# Patient Record
Sex: Female | Born: 1940 | Race: White | Hispanic: No | Smoking: Never smoker
Health system: Southern US, Community
[De-identification: ages and names within clinical notes are randomized; demographics above are authoritative.]

## PROBLEM LIST (undated history)

## (undated) DIAGNOSIS — I10 Essential (primary) hypertension: Secondary | ICD-10-CM

## (undated) DIAGNOSIS — K219 Gastro-esophageal reflux disease without esophagitis: Secondary | ICD-10-CM

## (undated) DIAGNOSIS — E7800 Pure hypercholesterolemia, unspecified: Secondary | ICD-10-CM

## (undated) DIAGNOSIS — F419 Anxiety disorder, unspecified: Secondary | ICD-10-CM

## (undated) DIAGNOSIS — E119 Type 2 diabetes mellitus without complications: Secondary | ICD-10-CM

## (undated) HISTORY — PX: COLON SURGERY: SHX602

## (undated) HISTORY — PX: ABDOMINAL HYSTERECTOMY: SHX81

## (undated) HISTORY — PX: BREAST EXCISIONAL BIOPSY: SUR124

---

## 2004-08-06 ENCOUNTER — Ambulatory Visit: Payer: Self-pay | Admitting: Occupational Therapy

## 2006-01-08 ENCOUNTER — Ambulatory Visit: Payer: Self-pay | Admitting: Otolaryngology

## 2006-01-17 IMAGING — MG UNKNOWN MG STUDY
1 series · 4 of 4 positions shown · non-contrast
Comparison: none

REASON FOR EXAM: screening

[R CC · right · 4 of 4 slices shown]
[im 1/4]
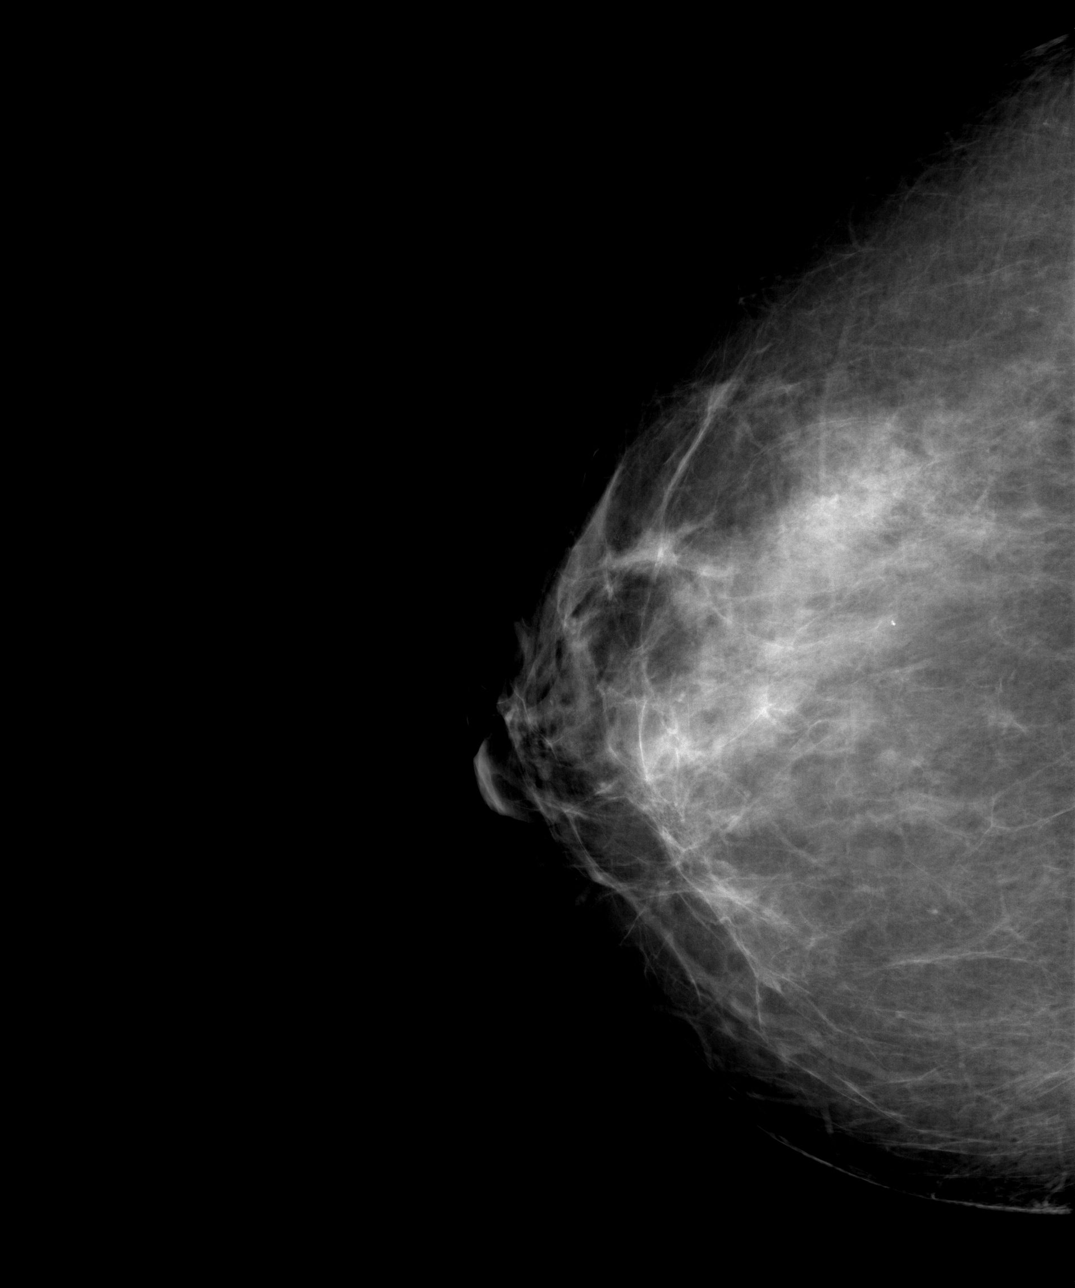
[im 2/4]
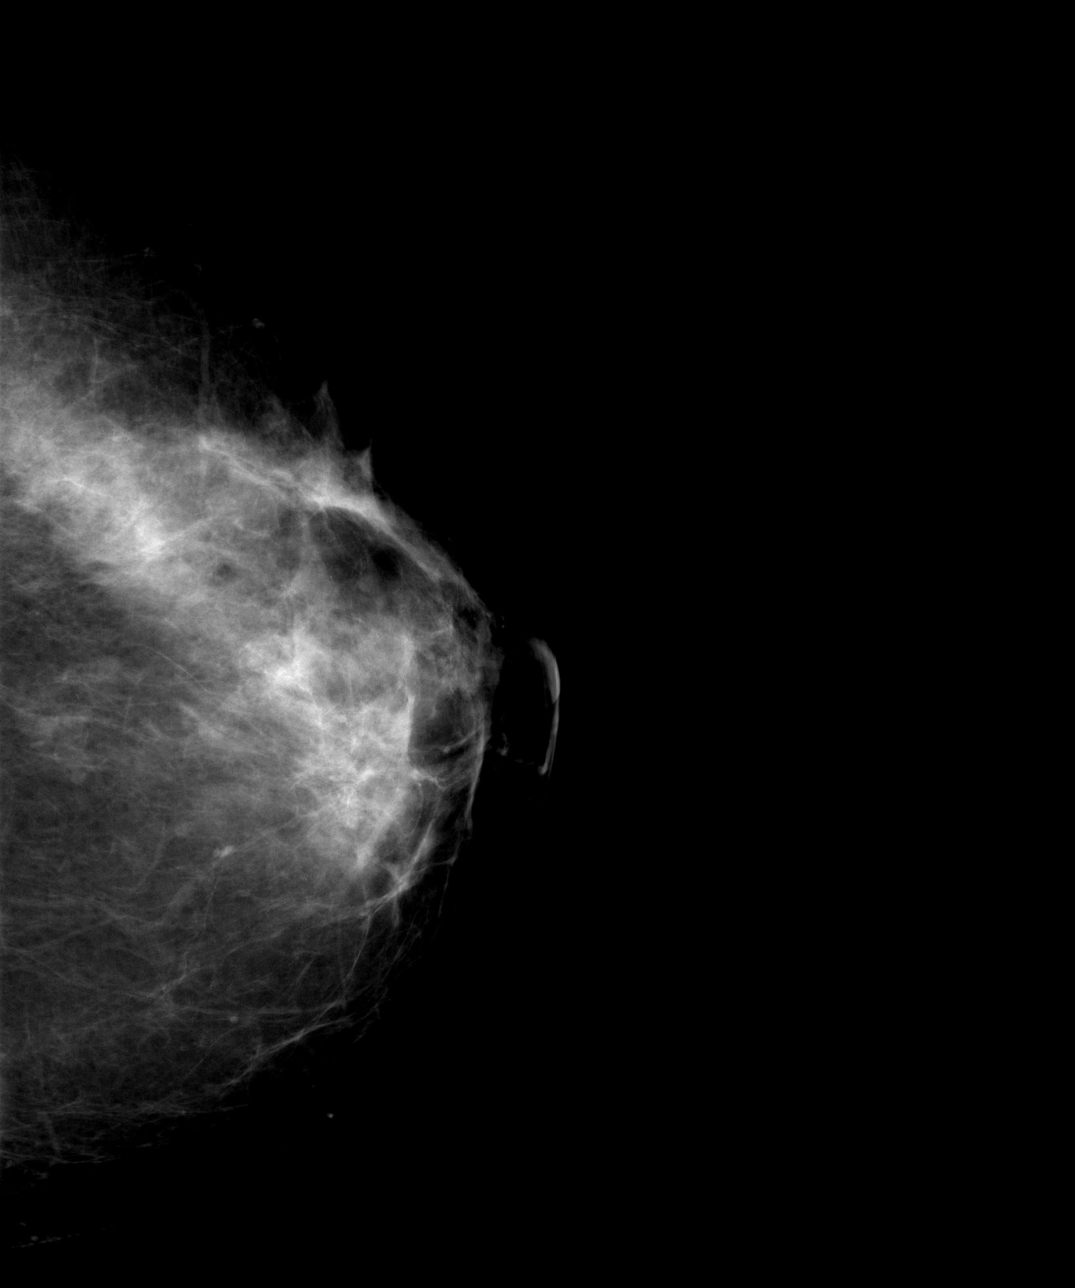
[im 3/4]
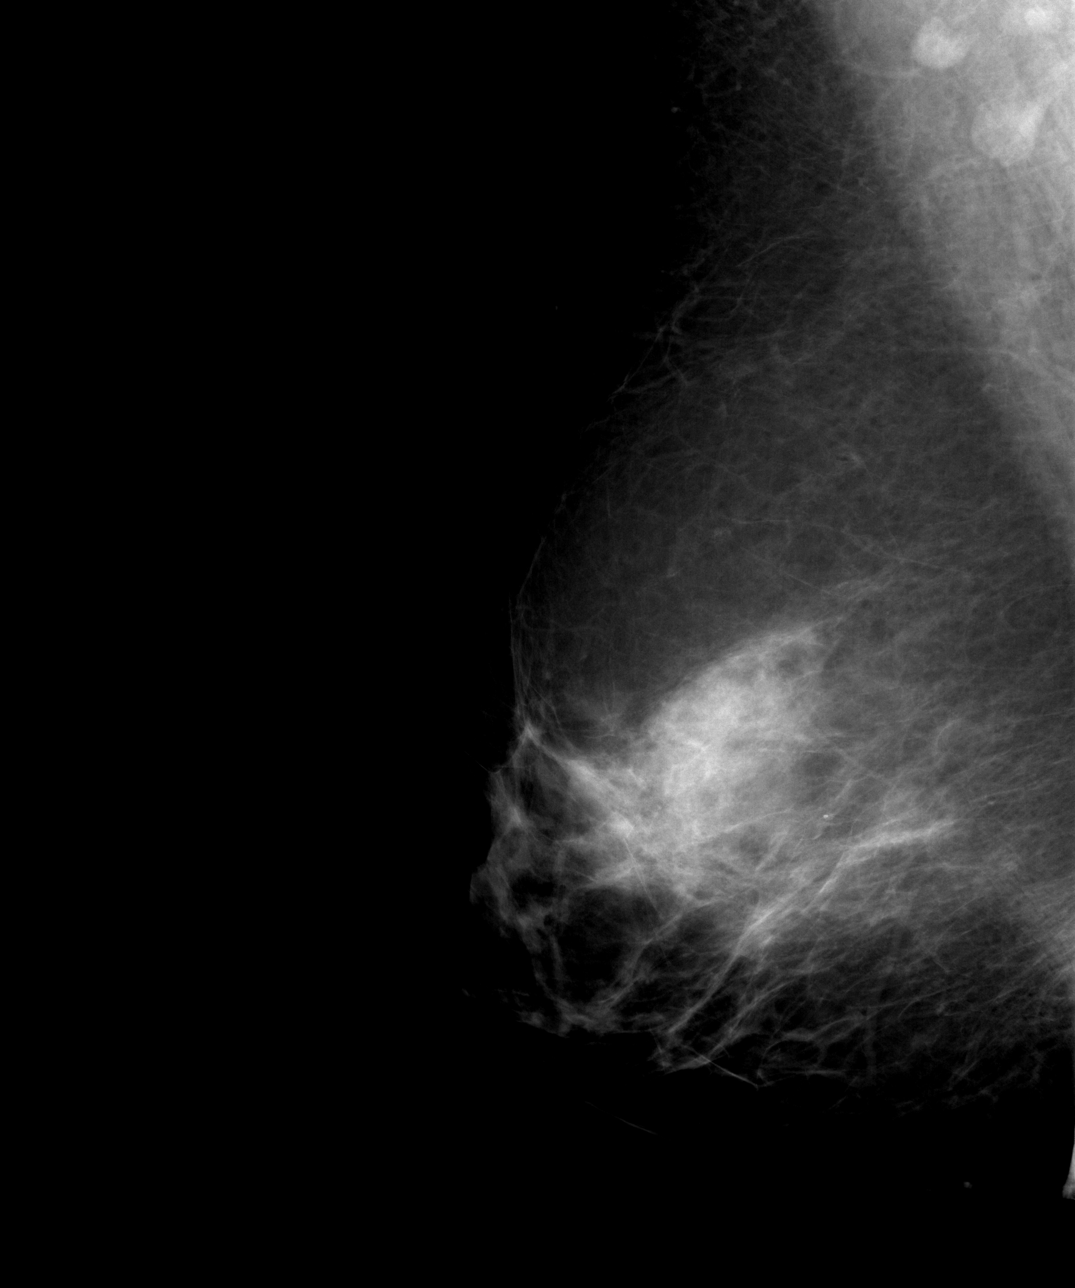
[im 4/4]
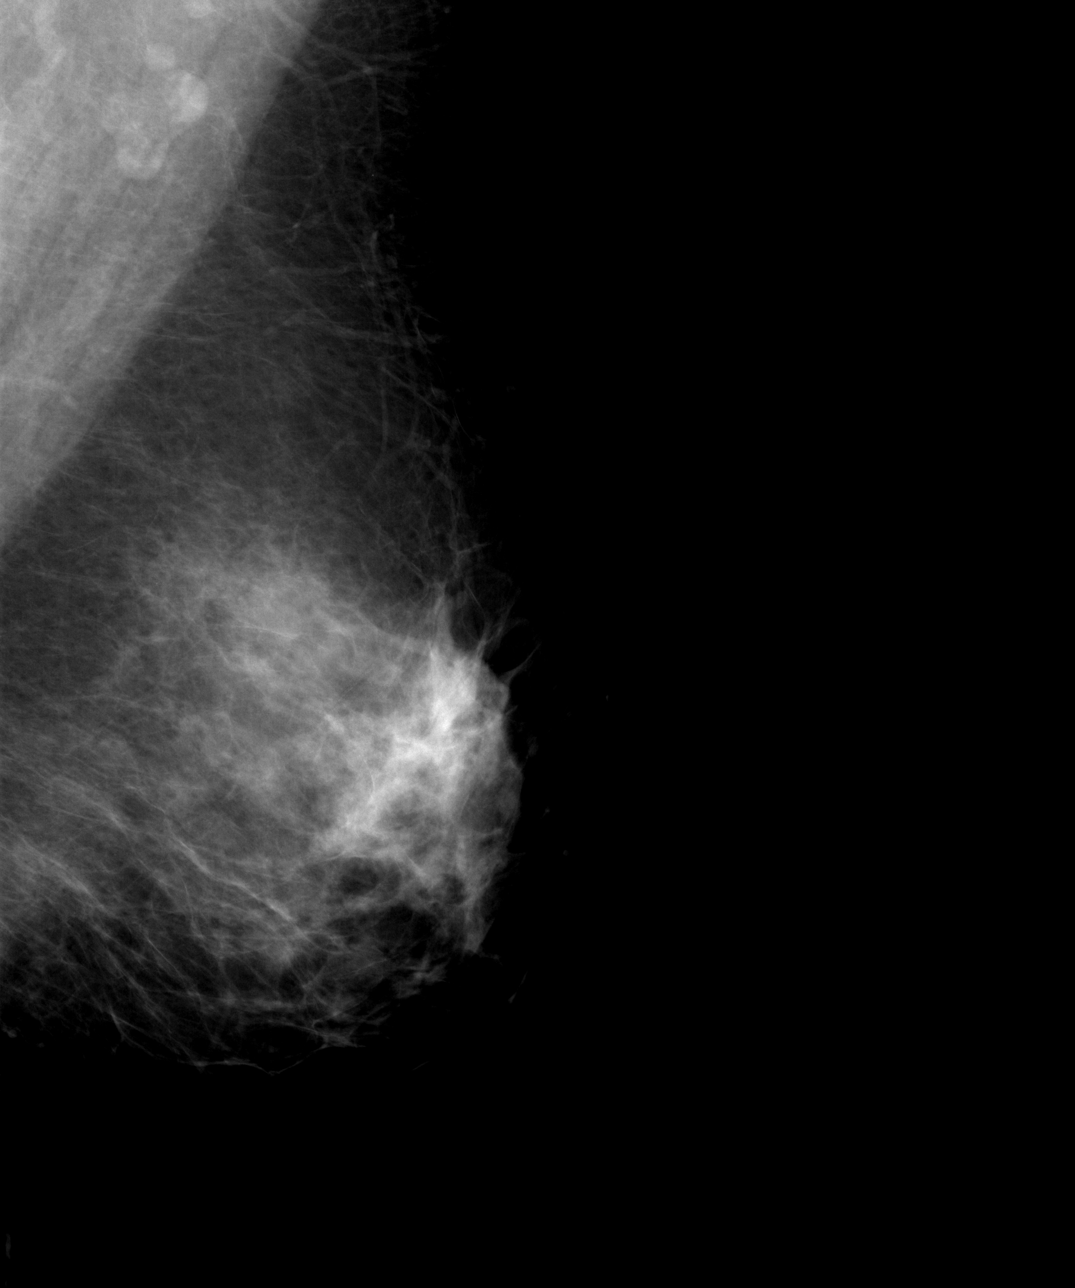

[4 of 4 positions shown; findings below may reference images not displayed]

Procedure: DIGITAL SCREENING MAMMOGRAM WITH CAD

 She is 62. She has no personal or family history of breast cancer. She does
not take hormones. She has no complaints today.

 Screening mammogram was performed and compared with the prior study from
02-02-01. The breasts are a mixture of dense fibroglandular tissue and fatty
replacement with the overall parenchymal pattern remaining stable. There is
no evidence of a dominant stellate mass, architectural distortion, or
microcalcification to suspect malignancy.
IMPRESSION: Stable mammogram. No change since prior studies. The patient should return
for a bilateral screening mammogram in one year.

 BI-RADS: Category 2-Benign Finding.

 A NEGATIVE MAMMOGRAM REPORT DOES NOT PRECLUDE BIOPSY OR OTHER EVALUATION OF
A CLINICALLY PALPABLE OR OTHERWISE SUSPICIOUS MASS OR LESION. BREAST CANCER
MAY NOT BE DETECTED BY MAMMOGRAPHY IN UP TO 10% OF CASES.

## 2006-02-24 ENCOUNTER — Ambulatory Visit: Payer: Self-pay | Admitting: Nurse Practitioner

## 2006-04-29 ENCOUNTER — Ambulatory Visit: Payer: Self-pay | Admitting: Nurse Practitioner

## 2007-10-28 ENCOUNTER — Ambulatory Visit: Payer: Self-pay | Admitting: Nurse Practitioner

## 2009-03-08 ENCOUNTER — Ambulatory Visit: Payer: Self-pay | Admitting: Nurse Practitioner

## 2009-04-18 ENCOUNTER — Emergency Department: Payer: Self-pay | Admitting: Emergency Medicine

## 2010-09-12 ENCOUNTER — Ambulatory Visit: Payer: Self-pay | Admitting: Nurse Practitioner

## 2013-07-28 ENCOUNTER — Ambulatory Visit: Payer: Self-pay | Admitting: Surgery

## 2013-07-29 ENCOUNTER — Ambulatory Visit: Payer: Self-pay | Admitting: Surgery

## 2013-07-29 DIAGNOSIS — I10 Essential (primary) hypertension: Secondary | ICD-10-CM

## 2013-07-29 LAB — CBC WITH DIFFERENTIAL/PLATELET
Basophil #: 0 10*3/uL (ref 0.0–0.1)
Basophil %: 0.4 %
Eosinophil #: 0.1 10*3/uL (ref 0.0–0.7)
Eosinophil %: 1.7 %
HCT: 34.9 % — ABNORMAL LOW (ref 35.0–47.0)
HGB: 12.2 g/dL (ref 12.0–16.0)
Lymphocyte #: 1.6 10*3/uL (ref 1.0–3.6)
Lymphocyte %: 25.8 %
MCH: 31.4 pg (ref 26.0–34.0)
MCHC: 34.9 g/dL (ref 32.0–36.0)
MCV: 90 fL (ref 80–100)
Monocyte #: 0.8 x10 3/mm (ref 0.2–0.9)
Monocyte %: 12.3 %
Neutrophil #: 3.7 10*3/uL (ref 1.4–6.5)
Neutrophil %: 59.8 %
PLATELETS: 237 10*3/uL (ref 150–440)
RBC: 3.88 10*6/uL (ref 3.80–5.20)
RDW: 13.5 % (ref 11.5–14.5)
WBC: 6.2 10*3/uL (ref 3.6–11.0)

## 2013-08-06 ENCOUNTER — Inpatient Hospital Stay: Payer: Self-pay | Admitting: Surgery

## 2013-08-07 LAB — CBC WITH DIFFERENTIAL/PLATELET
BASOS PCT: 0.3 %
Basophil #: 0 10*3/uL (ref 0.0–0.1)
EOS ABS: 0 10*3/uL (ref 0.0–0.7)
Eosinophil %: 0.1 %
HCT: 32.4 % — ABNORMAL LOW (ref 35.0–47.0)
HGB: 11.2 g/dL — AB (ref 12.0–16.0)
LYMPHS PCT: 13.5 %
Lymphocyte #: 1.4 10*3/uL (ref 1.0–3.6)
MCH: 31.2 pg (ref 26.0–34.0)
MCHC: 34.5 g/dL (ref 32.0–36.0)
MCV: 91 fL (ref 80–100)
Monocyte #: 0.9 x10 3/mm (ref 0.2–0.9)
Monocyte %: 8.2 %
NEUTROS ABS: 8.1 10*3/uL — AB (ref 1.4–6.5)
NEUTROS PCT: 77.9 %
Platelet: 179 10*3/uL (ref 150–440)
RBC: 3.58 10*6/uL — AB (ref 3.80–5.20)
RDW: 13.2 % (ref 11.5–14.5)
WBC: 10.4 10*3/uL (ref 3.6–11.0)

## 2013-08-07 LAB — BASIC METABOLIC PANEL
ANION GAP: 6 — AB (ref 7–16)
BUN: 10 mg/dL (ref 7–18)
CO2: 27 mmol/L (ref 21–32)
Calcium, Total: 8.2 mg/dL — ABNORMAL LOW (ref 8.5–10.1)
Chloride: 97 mmol/L — ABNORMAL LOW (ref 98–107)
Creatinine: 0.87 mg/dL (ref 0.60–1.30)
Glucose: 167 mg/dL — ABNORMAL HIGH (ref 65–99)
OSMOLALITY: 264 (ref 275–301)
Potassium: 2.9 mmol/L — ABNORMAL LOW (ref 3.5–5.1)
SODIUM: 130 mmol/L — AB (ref 136–145)

## 2013-08-08 LAB — BASIC METABOLIC PANEL
Anion Gap: 5 — ABNORMAL LOW (ref 7–16)
BUN: 7 mg/dL (ref 7–18)
CO2: 28 mmol/L (ref 21–32)
CREATININE: 0.66 mg/dL (ref 0.60–1.30)
Calcium, Total: 8.7 mg/dL (ref 8.5–10.1)
Chloride: 107 mmol/L (ref 98–107)
EGFR (African American): >60
Glucose: 126 mg/dL — ABNORMAL HIGH (ref 65–99)
Osmolality: 279 (ref 275–301)
POTASSIUM: 3.3 mmol/L — AB (ref 3.5–5.1)
SODIUM: 140 mmol/L (ref 136–145)

## 2013-08-08 LAB — CBC WITH DIFFERENTIAL/PLATELET
Basophil #: 0 10*3/uL (ref 0.0–0.1)
Basophil %: 0.5 %
EOS ABS: 0 10*3/uL (ref 0.0–0.7)
Eosinophil %: 0.6 %
HCT: 32.2 % — ABNORMAL LOW (ref 35.0–47.0)
HGB: 11.1 g/dL — ABNORMAL LOW (ref 12.0–16.0)
Lymphocyte #: 1.2 10*3/uL (ref 1.0–3.6)
Lymphocyte %: 15.2 %
MCH: 31 pg (ref 26.0–34.0)
MCHC: 34.4 g/dL (ref 32.0–36.0)
MCV: 90 fL (ref 80–100)
Monocyte #: 0.7 x10 3/mm (ref 0.2–0.9)
Monocyte %: 8.8 %
Neutrophil #: 5.7 10*3/uL (ref 1.4–6.5)
Neutrophil %: 74.9 %
PLATELETS: 160 10*3/uL (ref 150–440)
RBC: 3.58 10*6/uL — AB (ref 3.80–5.20)
RDW: 13.4 % (ref 11.5–14.5)
WBC: 7.7 10*3/uL (ref 3.6–11.0)

## 2013-08-09 LAB — BASIC METABOLIC PANEL
Anion Gap: 2 — ABNORMAL LOW (ref 7–16)
BUN: 7 mg/dL (ref 7–18)
CHLORIDE: 105 mmol/L (ref 98–107)
CO2: 32 mmol/L (ref 21–32)
Calcium, Total: 8.9 mg/dL (ref 8.5–10.1)
Creatinine: 0.73 mg/dL (ref 0.60–1.30)
EGFR (African American): >60
EGFR (Non-African Amer.): >60
Glucose: 113 mg/dL — ABNORMAL HIGH (ref 65–99)
Osmolality: 276 (ref 275–301)
POTASSIUM: 3.9 mmol/L (ref 3.5–5.1)
Sodium: 139 mmol/L (ref 136–145)

## 2013-08-10 LAB — PATHOLOGY REPORT

## 2013-08-28 ENCOUNTER — Emergency Department: Payer: Self-pay | Admitting: Emergency Medicine

## 2013-08-28 LAB — CBC WITH DIFFERENTIAL/PLATELET
BASOS ABS: 0.1 10*3/uL (ref 0.0–0.1)
Basophil %: 0.6 %
Eosinophil #: 0.1 10*3/uL (ref 0.0–0.7)
Eosinophil %: 0.4 %
HCT: 40 % (ref 35.0–47.0)
HGB: 13.6 g/dL (ref 12.0–16.0)
LYMPHS ABS: 1.2 10*3/uL (ref 1.0–3.6)
LYMPHS PCT: 7.4 %
MCH: 31.5 pg (ref 26.0–34.0)
MCHC: 34.1 g/dL (ref 32.0–36.0)
MCV: 92 fL (ref 80–100)
Monocyte #: 0.8 x10 3/mm (ref 0.2–0.9)
Monocyte %: 4.8 %
Neutrophil #: 14.2 10*3/uL — ABNORMAL HIGH (ref 1.4–6.5)
Neutrophil %: 86.8 %
Platelet: 345 10*3/uL (ref 150–440)
RBC: 4.34 10*6/uL (ref 3.80–5.20)
RDW: 13.5 % (ref 11.5–14.5)
WBC: 16.4 10*3/uL — ABNORMAL HIGH (ref 3.6–11.0)

## 2013-08-28 LAB — COMPREHENSIVE METABOLIC PANEL
ALBUMIN: 4.2 g/dL (ref 3.4–5.0)
ALK PHOS: 98 U/L
ANION GAP: 7 (ref 7–16)
BUN: 20 mg/dL — ABNORMAL HIGH (ref 7–18)
Bilirubin,Total: 0.4 mg/dL (ref 0.2–1.0)
CALCIUM: 9.7 mg/dL (ref 8.5–10.1)
Chloride: 100 mmol/L (ref 98–107)
Co2: 30 mmol/L (ref 21–32)
Creatinine: 0.88 mg/dL (ref 0.60–1.30)
EGFR (Non-African Amer.): >60
Glucose: 157 mg/dL — ABNORMAL HIGH (ref 65–99)
Osmolality: 280 (ref 275–301)
POTASSIUM: 3.4 mmol/L — AB (ref 3.5–5.1)
SGOT(AST): 10 U/L — ABNORMAL LOW (ref 15–37)
SGPT (ALT): 18 U/L (ref 12–78)
Sodium: 137 mmol/L (ref 136–145)
TOTAL PROTEIN: 7.5 g/dL (ref 6.4–8.2)

## 2014-02-11 ENCOUNTER — Observation Stay: Payer: Self-pay | Admitting: Specialist

## 2014-02-11 LAB — URINALYSIS, COMPLETE
BACTERIA: NONE SEEN
BLOOD: NEGATIVE
Bilirubin,UR: NEGATIVE
Glucose,UR: NEGATIVE mg/dL (ref 0–75)
Ketone: NEGATIVE
Nitrite: NEGATIVE
PH: 5 (ref 4.5–8.0)
Protein: NEGATIVE
RBC,UR: NONE SEEN /HPF (ref 0–5)
SPECIFIC GRAVITY: 1.011 (ref 1.003–1.030)
Squamous Epithelial: <1
WBC UR: 4 /HPF (ref 0–5)

## 2014-02-11 LAB — COMPREHENSIVE METABOLIC PANEL
ALK PHOS: 93 U/L
ALT: 22 U/L
AST: 24 U/L (ref 15–37)
Albumin: 3.8 g/dL (ref 3.4–5.0)
Anion Gap: 8 (ref 7–16)
BUN: 18 mg/dL (ref 7–18)
Bilirubin,Total: 0.3 mg/dL (ref 0.2–1.0)
Calcium, Total: 9 mg/dL (ref 8.5–10.1)
Chloride: 103 mmol/L (ref 98–107)
Co2: 28 mmol/L (ref 21–32)
Creatinine: 0.87 mg/dL (ref 0.60–1.30)
Glucose: 110 mg/dL — ABNORMAL HIGH (ref 65–99)
Osmolality: 280 (ref 275–301)
Potassium: 3.8 mmol/L (ref 3.5–5.1)
Sodium: 139 mmol/L (ref 136–145)
Total Protein: 7.1 g/dL (ref 6.4–8.2)

## 2014-02-11 LAB — TROPONIN I

## 2014-02-11 LAB — CBC WITH DIFFERENTIAL/PLATELET
Basophil #: 0 10*3/uL (ref 0.0–0.1)
Basophil %: 0.8 %
Eosinophil #: 0.1 10*3/uL (ref 0.0–0.7)
Eosinophil %: 3.1 %
HCT: 37.7 % (ref 35.0–47.0)
HGB: 12.3 g/dL (ref 12.0–16.0)
LYMPHS ABS: 1.5 10*3/uL (ref 1.0–3.6)
Lymphocyte %: 31.7 %
MCH: 30.7 pg (ref 26.0–34.0)
MCHC: 32.5 g/dL (ref 32.0–36.0)
MCV: 95 fL (ref 80–100)
Monocyte #: 0.5 x10 3/mm (ref 0.2–0.9)
Monocyte %: 10.8 %
Neutrophil #: 2.5 10*3/uL (ref 1.4–6.5)
Neutrophil %: 53.6 %
Platelet: 191 10*3/uL (ref 150–440)
RBC: 3.99 10*6/uL (ref 3.80–5.20)
RDW: 12.9 % (ref 11.5–14.5)
WBC: 4.7 10*3/uL (ref 3.6–11.0)

## 2014-02-12 LAB — LIPID PANEL
Cholesterol: 154 mg/dL (ref 0–200)
HDL: 29 mg/dL — AB (ref 40–60)
LDL CHOLESTEROL, CALC: 66 mg/dL (ref 0–100)
TRIGLYCERIDES: 296 mg/dL — AB (ref 0–200)
VLDL Cholesterol, Calc: 59 mg/dL — ABNORMAL HIGH (ref 5–40)

## 2014-02-12 LAB — BASIC METABOLIC PANEL
Anion Gap: 6 — ABNORMAL LOW (ref 7–16)
BUN: 21 mg/dL — ABNORMAL HIGH (ref 7–18)
CHLORIDE: 106 mmol/L (ref 98–107)
CO2: 30 mmol/L (ref 21–32)
CREATININE: 0.82 mg/dL (ref 0.60–1.30)
Calcium, Total: 8.7 mg/dL (ref 8.5–10.1)
EGFR (African American): >60
GLUCOSE: 127 mg/dL — AB (ref 65–99)
OSMOLALITY: 288 (ref 275–301)
Potassium: 3.8 mmol/L (ref 3.5–5.1)
SODIUM: 142 mmol/L (ref 136–145)

## 2014-02-12 LAB — TSH: Thyroid Stimulating Horm: 1.31 u[IU]/mL

## 2014-02-12 LAB — HEMOGLOBIN A1C: Hemoglobin A1C: 6.7 % — ABNORMAL HIGH (ref 4.2–6.3)

## 2014-11-12 NOTE — Op Note (Signed)
PATIENT NAME:  Elizabeth Mcmillan, Elizabeth Mcmillan MR#:  [id number] DATE OF BIRTH:  11/25/1940  DATE OF PROCEDURE:  08/06/2013  PREOPERATIVE DIAGNOSIS: Left ovarian cyst.  POSTOPERATIVE DIAGNOSIS: Left ovarian cyst.   PROCEDURE: Bilateral salpingo-oophorectomy.   ANESTHESIA: General endotracheal anesthesia,   SURGEON: Boykin Nearing, M.D.   CO-SURGEON: Rochel Brome, MD.   INDICATIONS: This is a 74 year old gravida 4, para 4 patient with a tubovillous adenoma. Dr. Rochel Brome is the primary surgeon and co-surgeon for part of the procedure. The patient is scheduled for anterior resection of the colon   PROCEDURE: After adequate general endotracheal anesthesia, Dr. Rochel Brome made a vertical incision entrance into peritoneal cavity, accomplished without difficulty. Attention was directed to the patient's left ovary, which there was a 4 x 3 serous simple-appearing cyst with some small bowel adhesions noted to the ovary. These were taken down sharply. The infundibulopelvic ligament was clamped including the left ovary, the ovarian cyst and the left fallopian tube. Sharp dissection was used to remove the cyst. There was no rupture of the cyst. Pedicle was ligated with 0 Vicryl suture. Two separate sutures applied. A small oozing area was controlled with a 2-0 Vicryl figure-of-eight suture.   Attention was directed to the patient's right fallopian tube and ovary, which showed a small atretic right ovary, a small para-ovarian cyst noted as well. The ureter was identified with normal peristaltic activity well away from the right ovary. The infundibulopelvic ligament with the ovary and fallopian tube were clamped. Sharp dissection was used to remove the right fallopian tube and ovary and small cyst. Pedicle was ligated with 0 Vicryl suture with 2 separate sutures used to control hemostasis. The rest of the procedure will be performed by Dr. Rochel Brome. Please reference his operative report and the rest of the  dictation.   ____________________________ Boykin Nearing, MD tjs:aw D: 08/06/2013 10:33:06 ET T: 08/06/2013 10:47:11 ET JOB#: [id number]  cc: Boykin Nearing, MD, <Dictator> Boykin Nearing MD ELECTRONICALLY SIGNED 08/07/2013 10:25

## 2014-11-12 NOTE — Op Note (Signed)
PATIENT NAME:  Elizabeth Mcmillan, Elizabeth Mcmillan MR#:  [id number] DATE OF BIRTH:  Oct 31, 1940  DATE OF PROCEDURE:  08/06/2013  This is a repeat dictation.   PREOPERATIVE DIAGNOSIS:  Tubulovillous adenoma of the sigmoid colon, left ovarian cyst.   POSTOPERATIVE DIAGNOSIS:  Tubulovillous adenoma of the sigmoid colon, left ovarian cyst.  PROCEDURE:  Low anterior resection of the sigmoid colon and bilateral salpingo-oophorectomy.   INDICATIONS:  This patient had had some recent left lower quadrant discomfort, had ultrasound findings of approximately 4 cm cyst of the left ovary.  She saw Dr. Ouida Sills in the office and was advised to have colonoscopy which did have colonoscopy done by Dr. Rayann Heman which did demonstrate tubulovillous adenoma of the sigmoid colon 15 cm from the anal os which had a wide base, several centimeters in dimension and could not be resected with the colonoscope and therefore recommend surgery as definitive treatment.   DESCRIPTION OF PROCEDURE:  The patient was placed on the operating table in the supine position under general endotracheal anesthesia.  The legs were elevated into the lithotomy position using bumblebee stirrups.  The anal area was prepared with Betadine solution, perineum was prepared as well.  Foley catheter was inserted by the circulating nurse with drainage of clear yellow urine.  Next, the abdomen was prepared with ChloraPrep and draped in a sterile manner.   A lower abdominal midline incision was made and carried down through subcutaneous tissues.  Several small bleeding points were cauterized.  The midline fascia was incised and the peritoneal cavity was opened.   Initial inspection was done.  There was no palpable mass within the liver.  There was a simple-appearing 4 cm cyst of the left ovary.  Right ovary appeared normal.    I began the colonic dissection.    Dr. Ouida Sills came and then he did the bilateral salpingo-oophorectomy as I assisted and he will dictate a  separate note describing his surgical procedure.   After Dr. Ouida Sills finished, then he left the Operating Room and I continued with the colon resection.    The sigmoid colon was mobilized with incision of the lateral peritoneal reflection and also mobilized the descending colon as well.  It is noted that bilateral ureters were identified.  I could see the ink mark in the distal sigmoid colon, although there was no hard palpable mass.  There was no palpable adenopathy.  A proximal margin of resection was selected in the mid aspect of the sigmoid colon and the window was created in the mesentery at this point and the GIA 55 stapler was placed across the bowel and activated placing two rows of staples on each side and dividing the bowel.  Next, Harmonic scalpel was used to begin the mesenteric dissection and dissecting down deeply within the mesentery.  The inferior epigastric artery and veins were suture ligated with 0 chromic and also divided with the Harmonic scalpel.  Further dissection was carried out dissecting the mesentery and subsequently created another window in the small bowel distal to the tattoo and subsequently completed the mesenteric dissection with the Harmonic scalpel.  Bleeding was minimal.  Next, after skeletonizing the bowel at the sigmoid rectal junction the bowel was divided by placing a TA-30 stapler across the bowel and activating it and then cut across the bowel with a scalpel removing the segment of sigmoid colon and momentarily removed the TA-30 stapler.    The segment of colon was carried over to the side table and examined.  Seeing the  polyp within the colon with approximately 2 cm normal-appearing colon distal to the polyp and this was submitted for routine pathology and with the requisition slip indicating that the staple line on the specimen was the proximal margin and this was submitted for pathology.   Next, the proximal portion of bowel was examined and also examined  the pelvis, operative area and he could see hemostasis was intact.  Next, the proximal staple line was excised and held the bowel edges with Allis clamps and introduced the small sizer into the bowel which went in easily and then the 29 mm sizer which went in with some minimal resistance and determined to use the 29 mm EEA stapler.  Next, an opening was made in the top sheet to expose the rectum and the small sizer was introduced and could easily be passed up to the staple line.  Next, the medium sizer was introduced and then the larger sizer was introduced.   The 3-0 Prolene pursestring suture was placed into the bowel and the anvil was put into the bowel and the pursestring was tied down.  Next, the 29 mm EEA stapler was passed up through the rectum up to the staple line and the pin was advanced just posterior to the staple line and attached to the anvil.  The EEA was engaged to the firing range and appeared to be in satisfactory position and was activated and then was removed.  The anastomotic rings were intact.  Gloves were changed.  I further inspected the anastomosis and could see that it was intact.  Also, hemostasis was intact in the pelvis.  Next, the bowel was placed along the sacral promontory.  There was no tension on the anastomosis.  Next, the omentum was brought beneath the wound.  The peritoneum was closed with running 3-0 chromic suture.  The midline fascia was closed with interrupted 0 Maxon figure-of-eight sutures and the skin was closed with clips.  Dressings were applied with paper tape.    The patient tolerated the procedure satisfactorily and was then prepared for transfer to the recovery room.    ____________________________ Lenna Sciara. Rochel Brome, MD jws:ea D: 08/20/2013 17:35:10 ET T: 08/21/2013 06:22:33 ET JOB#: [id number]  cc: Loreli Dollar, MD, <Dictator> Loreli Dollar MD ELECTRONICALLY SIGNED 08/24/2013 9:18

## 2014-11-12 NOTE — Discharge Summary (Signed)
PATIENT NAME:  Elizabeth Mcmillan, SCHMIESING MR#:  [id number] DATE OF BIRTH:  08-16-1940  DATE OF ADMISSION:  02/11/2014 DATE OF DISCHARGE:  02/12/2014  For a detailed note, please look at the history and physical done on admission by Dr. Dustin Flock.   DIAGNOSES AT DISCHARGE: As follows:  1.  Transient ischemic attack.  2.  History of hypertension. 3.  Diabetes. 4.  Hyperlipidemia. 5.  Depression.   DISCHARGE DIET: The patient is being discharged on a low-sodium, low-fat, American Diabetic Association diet.   ACTIVITY: As tolerated.   FOLLOWUP:  Follow-up with Dr. Erma Pinto in the next 1-2 weeks.   DISCHARGE MEDICATIONS: 1.  Metformin 750 mg b.i.d.  2.  Crestor 40 mg daily. 3.  Effexor 75 mg b.i.d. 4.  Calcium with vitamin D 1 tab daily. 5.  Irbesartan 300 mg daily. 6.  Tylenol 650 q. 4 hours as needed. 7.  Atenolol/chlorthalidone 50/25 one tab daily.  8.  Aspirin 81 mg daily.   PERTINENT STUDIES DONE DURING THE HOSPITAL COURSE: Are as follows: A CT scan of the head done without contrast showing no acute intracranial pathology. An MRI of the brain showing no evidence of any acute infarct, negative intracranial MRA. 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 A 2-dimensional echocardiogram showing ejection fraction of 55%-60%, impaired relaxation pattern with LV diastolic filling, normal aortic valve without any evidence of aortic stenosis.  No cardiac source of emboli.   HOSPITAL COURSE: This is a 74 year old female who presented to the hospital with slurred speech.   1.  Transient ischemic attack. This was likely presumed diagnosis given the patient's transient neurologic symptoms including slurred speech and weakness, which have now resolved. The patient underwent extensive work-up including a CT of the head, MRI of the brain, carotid duplex, and echocardiogram, all of which have been within normal limits.  The patient has had no evidence of any arrhythmia on telemetry. She has no acute neurologic symptoms presently; therefore, being discharged home. She will continue her aspirin and Crestor as stated.   2.  Diabetes. The patient's blood sugars remained stable. She was maintained on her metformin. She will continue that.   3.  Hypertension. The patient remained hemodynamically stable. She will continue her chlorthalidone, atenolol, and irbesartan.  4.  Hyperlipidemia. The patient was maintained on her Crestor.  She will resume that.   5.  Depression. The patient was maintained on her Effexor, and she will also resume that upon discharge.   6.  CODE STATUS: THE PATIENT IS A FULL CODE.   TIME SPENT: 35 minutes.    ____________________________ Belia Heman. Verdell Carmine, MD vjs:ts D: 02/12/2014 14:03:24 ET T: 02/12/2014 17:33:28 ET JOB#: [id number]  cc: Belia Heman. Verdell Carmine, MD, <Dictator> Eather Colas. Sharlett Iles, FNP-C Henreitta Leber MD ELECTRONICALLY SIGNED 02/17/2014 14:08

## 2014-11-12 NOTE — H&P (Signed)
PATIENT NAME:  Elizabeth Mcmillan, AKERS MR#:  [id number] DATE OF BIRTH:  11/09/1940  DATE OF ADMISSION:  02/11/2014  PRIMARY CARE PROVIDER: Juliann Pulse L. Sharlett Iles, FNP-C  EMERGENCY DEPARTMENT REFERRING PHYSICIAN: Latina Craver, MD  CHIEF COMPLAINT: Dizziness, unsteady gait, weakness.   HISTORY OF PRESENT ILLNESS: The patient is a 74 year old white female with history of diabetes, hypertension, GERD, depression, hyperlipidemia, who states that around 8:00 this morning, she started having dizziness, unsteady gait. When she tried to walk, she was going backwards and leaning towards one side. She has not had similar symptoms before. She also feels very fatigued and feels like she is going to sleep. Due to these symptoms, she came to the ED. Had a CT scan of the head which had no evidence of acute intracranial pathology. We are asked to admit the patient for further evaluation and treatment. The patient otherwise reports no asymmetrical weakness. Denies any slurred speech. No blurred vision or double vision. Otherwise, denies any fevers, chills. No chest pain or shortness of breath. She is under a lot of stress due to her son being diagnosed with cancer.   PAST MEDICAL HISTORY:  1. History of tubulovillous adenoma, status post resection.  2. History of benign ovarian cyst.  3. Diabetes.  4. Hypertension.  5. GERD.  6. Depression.  7. Hyperlipidemia.   PAST SURGICAL HISTORY: Status post tubulovillous adenoma resection.   ALLERGIES: None.   CURRENT MEDICATIONS AT HOME:  1. Venlafaxine 75 one tab p.o. b.i.d. 2. Metformin 750 one tab p.o. b.i.d.  3. Irbesartan 300 daily. 4. Crestor 40 daily.  5. Calcium plus vitamin D 1 tab p.o. daily.  6. Atenolol/chlorthalidone 50/25 p.o. daily.  7. Acetaminophen 650 one to two tabs q.4 p.r.n. for pain.   SOCIAL HISTORY: Does not smoke. Does not drink. No drugs.   FAMILY HISTORY: CVA.   REVIEW OF SYSTEMS:  CONSTITUTIONAL: Denies any fevers. Complains of some  fatigue, weakness. No pain. No weight loss. No weight gain.  EYES: No blurred or double vision. No pain. No redness. Does have cataracts.  ENT: No tinnitus. No ear pain. No hearing loss. No seasonal or year-round allergies. No epistaxis. No nasal discharge. No snoring. No difficulty swallowing.  RESPIRATORY: Denies any cough, wheezing, hemoptysis. No dyspnea. No COPD.  CARDIOVASCULAR: Denies any chest pain, orthopnea, edema or arrhythmia.  GASTROINTESTINAL: No nausea, vomiting, diarrhea. No abdominal pain. No hematemesis. No melena. No ulcer.  GENITOURINARY: Denies any dysuria, hematuria, renal calculus or frequency.  ENDOCRINE: Denies any polyuria, nocturia or thyroid problems.  HEMATOLOGIC AND LYMPHATIC: Denies anemia, easy bruisability or bleeding.  SKIN: No acne. No rash.  MUSCULOSKELETAL: Denies any pain in the neck, back or shoulder.  NEUROLOGIC: No numbness. No CVA, TIA or seizures in the past.  PSYCHIATRIC: Does have depression.   PHYSICAL EXAMINATION:  VITAL SIGNS: Temperature 98, pulse 84, respirations 18, blood pressure 125/67, O2 95% on room air.  GENERAL: The patient is a well-developed, well-nourished female, appears currently comfortable, in no acute distress.  HEENT: Head atraumatic, normocephalic. Pupils equally round and reactive to light and accommodation. There is no conjunctival pallor, no scleral icterus. Nasal exam shows no drainage or ulceration. Oropharynx is clear, without any exudate.  NECK: Supple, without any JVD.  CARDIOVASCULAR: Regular rate and rhythm. No murmurs, rubs, clicks or gallops.  LUNGS: Clear to auscultation bilaterally without any rales, rhonchi or wheezing.  ABDOMEN: Soft, nontender, nondistended. Positive bowel sounds x4.  EXTREMITIES: No clubbing, cyanosis or edema.  SKIN: No  rash.  LYMPHATICS: No lymph nodes palpable.  VASCULAR: Good DP, PT pulses.  PSYCHIATRIC: Not anxious or depressed.  NEUROLOGIC: Awake, alert, oriented x3. Cranial nerves  II through XII grossly intact. There is no nystagmus noted. Finger-to-nose test is intact. Strength 5 out of 5 in all 4 extremities. Babinski's downgoing.  LYMPHATIC: Lymph nodes nonpalpable.   LABORATORY EVALUATION: CT scan of the head without contrast showed no acute intracranial abnormality. EKG: Normal sinus rhythm without any ST-T wave changes. WBC 4.7, hemoglobin 12.3, platelet count 191. BMP: Glucose 110, BUN 18, creatinine 0.87, sodium 139, potassium 3.8, chloride 103, CO2 28, calcium 9.0. LFTs are normal. Troponin less than 0.02.   ASSESSMENT AND PLAN: The patient is a 74 year old who presents with unsteady gait, lethargy.  1. Dizziness, unsteady gait, lethargy, possible posterior circulation cerebrovascular accident/transient ischemic attack. At this time, will get an MRI of the brain, MRA of the brain as well as carotids and echocardiogram. Will place her on aspirin. PT evaluation. Neuro checks. Continue her cholesterol medication as taking at home.  2. Diabetes. Sliding scale insulin and metformin as taking at home. Will check a hemoglobin A1c. 3. Hypertension. Will allow her permissive hypertension. I will hold her atenolol and chlorthalidone. I will continue the irbesartan.  4. Hyperlipidemia. Continue Crestor at 40 mg as taking at home. Will check a fasting lipid panel in the a.m.  5. Miscellaneous. The patient will be on Lovenox for deep vein thrombosis prophylaxis.   TIME SPENT: 55 minutes.   ____________________________ Lafonda Mosses Posey Pronto, MD shp:lb D: 02/11/2014 13:06:00 ET T: 02/11/2014 13:36:52 ET JOB#: [id number]  cc: Alisson Rozell H. Posey Pronto, MD, <Dictator> Alric Seton MD ELECTRONICALLY SIGNED 02/19/2014 11:05

## 2014-11-12 NOTE — Discharge Summary (Signed)
PATIENT NAME:  Elizabeth Mcmillan, Elizabeth Mcmillan MR#:  [id number] DATE OF BIRTH:  17-Dec-1940  DATE OF ADMISSION:  08/06/2013 DATE OF DISCHARGE:  08/10/2013  HISTORY OF PRESENT ILLNESS: This 74 year old female had recent colonoscopy with findings of an unresectable tubulovillous adenoma of the distal sigmoid colon. She also had ultrasound findings of a 4 cm cyst of the left ovary.   Other past history does include diabetes, hyperlipidemia, hypertension, gastroesophageal reflux. Details of the history are recorded on the typed H and P. Medicines are included.    HOSPITAL COURSE:  She was brought in through the outpatient surgery department and had a low anterior resection of the sigmoid colon, also had bilateral salpingo-oophorectomy which was done by Dr. Ouida Sills. She did have a preop prophylactic antibiotic. She was also treated with subcutaneous heparin prophylaxis. She was initially begun on a clear liquid diet, later advanced to a full liquid diet. Her blood sugars were monitored in the hospital and did well. She has gradually increased activity. She has been tolerating her full liquid diet satisfactorily, has had numerous bowel movements today and being advanced to a solid diet. She had requested not to take her Januvia when she goes home as she thinks she has had some side effects. Pathology demonstrated tubulovillous adenoma of the sigmoid colon, also had a benign left ovarian cyst.   FINAL DIAGNOSES:  1.  Tubulovillous adenoma of the sigmoid colon.  2.  Benign cyst of the left ovary.   OPERATION: Low anterior resection of the sigmoid colon and bilateral salpingo-oophorectomy.   PLAN: To discontinue her Januvia and she will contact her primary physician at [facility] regarding her diabetic blood sugar control.   Discharge instructions were given and also wound care. Will have her shower. We will have her return to my office on the 10th postoperative day to remove skin clips.    ____________________________ J. Rochel Brome, MD jws:cs D: 08/10/2013 17:28:00 ET T: 08/10/2013 19:13:14 ET JOB#: [id number]  cc: Loreli Dollar, MD, <Dictator> Loreli Dollar MD ELECTRONICALLY SIGNED 08/11/2013 17:53

## 2015-01-24 ENCOUNTER — Other Ambulatory Visit: Payer: Self-pay | Admitting: Nurse Practitioner

## 2015-01-24 DIAGNOSIS — Z1231 Encounter for screening mammogram for malignant neoplasm of breast: Secondary | ICD-10-CM

## 2015-01-31 ENCOUNTER — Ambulatory Visit
Admission: RE | Admit: 2015-01-31 | Discharge: 2015-01-31 | Disposition: A | Discharge status: Home / Self Care | Payer: Medicare Other | Source: Ambulatory Visit | Attending: Nurse Practitioner | Admitting: Nurse Practitioner

## 2015-01-31 ENCOUNTER — Other Ambulatory Visit: Payer: Self-pay | Admitting: Nurse Practitioner

## 2015-01-31 DIAGNOSIS — Z1231 Encounter for screening mammogram for malignant neoplasm of breast: Secondary | ICD-10-CM | POA: Insufficient documentation

## 2015-05-06 ENCOUNTER — Encounter: Payer: Self-pay | Admitting: Emergency Medicine

## 2015-05-06 ENCOUNTER — Emergency Department
Admission: EM | Admit: 2015-05-06 | Discharge: 2015-05-06 | Disposition: A | Discharge status: Home / Self Care | Payer: Medicare Other | Attending: Emergency Medicine | Admitting: Emergency Medicine

## 2015-05-06 ENCOUNTER — Emergency Department: Payer: Medicare Other

## 2015-05-06 DIAGNOSIS — E119 Type 2 diabetes mellitus without complications: Secondary | ICD-10-CM | POA: Insufficient documentation

## 2015-05-06 DIAGNOSIS — IMO0001 Reserved for inherently not codable concepts without codable children: Secondary | ICD-10-CM

## 2015-05-06 DIAGNOSIS — R0600 Dyspnea, unspecified: Secondary | ICD-10-CM | POA: Diagnosis not present

## 2015-05-06 DIAGNOSIS — E785 Hyperlipidemia, unspecified: Secondary | ICD-10-CM | POA: Diagnosis not present

## 2015-05-06 DIAGNOSIS — M545 Low back pain: Secondary | ICD-10-CM | POA: Diagnosis present

## 2015-05-06 DIAGNOSIS — I10 Essential (primary) hypertension: Secondary | ICD-10-CM | POA: Diagnosis not present

## 2015-05-06 DIAGNOSIS — M4726 Other spondylosis with radiculopathy, lumbar region: Secondary | ICD-10-CM | POA: Diagnosis not present

## 2015-05-06 HISTORY — DX: Essential (primary) hypertension: I10

## 2015-05-06 HISTORY — DX: Pure hypercholesterolemia, unspecified: E78.00

## 2015-05-06 HISTORY — DX: Type 2 diabetes mellitus without complications: E11.9

## 2015-05-06 MED ORDER — MELOXICAM 7.5 MG PO TABS: 7.5000 mg | ORAL_TABLET | Freq: Every day | ORAL | Status: DC

## 2015-05-06 MED ORDER — HYDROMORPHONE HCL 1 MG/ML IJ SOLN
1.0000 mg | Freq: Once | INTRAMUSCULAR | Status: AC
  Administered 2015-05-06: 1 mg via INTRAMUSCULAR
  Filled 2015-05-06: qty 1

## 2015-05-06 NOTE — ED Provider Notes (Signed)
[facility] Emergency Department Provider Note  ____________________________________________  Time seen: Approximately 10:47 AM  I have reviewed the triage vital signs and the nursing notes.   HISTORY  Chief Complaint Back Pain    HPI Elizabeth Mcmillan is a 74 y.o. female complaining of 4 days of low back pain is radiating to the right anterior thigh. Patient denies any bladder or bowel dysfunction. Patient states she was seen by ER [place] yesterdaygiven Percocets, NSAIDs, and muscle relaxant. Patient state these medicines have not relieved her pain. No images were taken at the ER. Patient rates her pain as a 10 over 10.   Past Medical History  Diagnosis Date  . Diabetes mellitus without complication ([place])   . Hypertension   . Hypercholesteremia     There are no active problems to display for this patient.   Past Surgical History  Procedure Laterality Date  . Breast excisional biopsy Left years ago    negative    Current Outpatient Rx  Name  Route  Sig  Dispense  Refill  . meloxicam (MOBIC) 7.5 MG tablet   Oral   Take 1 tablet (7.5 mg total) by mouth daily.   10 tablet   0     Allergies Review of patient's allergies indicates no known allergies.  Family History  Problem Relation Age of Onset  . Breast cancer Cousin     2nd cousin. 70's    Social History Social History  Substance Use Topics  . Smoking status: Never Smoker   . Smokeless tobacco: None  . Alcohol Use: None    Review of Systems Constitutional: No fever/chills Eyes: No visual changes. ENT: No sore throat. Cardiovascular: Denies chest pain. Respiratory: Denies shortness of breath. Gastrointestinal: No abdominal pain.  No nausea, no vomiting.  No diarrhea.  No constipation. Genitourinary: Negative for dysuria. Musculoskeletal: Positive for back pain. Skin: Negative for rash. Neurological: Negative for headaches, focal weakness or numbness. Endocrine  hypertension, hyperlipidemia, and diabetes 10-point ROS otherwise negative.  ____________________________________________   PHYSICAL EXAM:  VITAL SIGNS: ED Triage Vitals  Enc Vitals Group     BP --      Pulse --      Resp --      Temp --      Temp src --      SpO2 --      Weight --      Height --      Head Cir --      Peak Flow --      Pain Score 05/06/15 1023 10     Pain Loc --      Pain Edu? --      Excl. in [place]? --     Constitutional: Alert and oriented. Moderate distress Eyes: Conjunctivae are normal. PERRL. EOMI. Head: Atraumatic. Nose: No congestion/rhinnorhea. Mouth/Throat: Mucous membranes are moist.  Oropharynx non-erythematous. Neck: No stridor.  No cervical spine tenderness to palpation. Hematological/Lymphatic/Immunilogical: No cervical lymphadenopathy. Cardiovascular: Normal rate, regular rhythm. Grossly normal heart sounds.  Good peripheral circulation. Respiratory: Normal respiratory effort.  No retractions. Lungs CTAB. Gastrointestinal: Soft and nontender. No distention. No abdominal bruits. No CVA tenderness. Musculoskeletal: No spinal deformity. Patient has decreased range of motion's in all fields. Patient has moderate guarding palpation L3-S1. Patient has right straight positive leg test is 70.  Neurologic:  Normal speech and language. No gross focal neurologic deficits are appreciated. No gait instability. Skin:  Skin is warm, dry and intact. No rash noted.  Psychiatric: Mood and affect are normal. Speech and behavior are normal.  ____________________________________________   LABS (all labs ordered are listed, but only abnormal results are displayed)  Labs Reviewed - No data to display ____________________________________________  EKG   ____________________________________________  RADIOLOGY  Moderate multilevel degenerative changes of L-spine. No areas of fracture dislocations. I, Sable Feil, personally viewed and evaluated these  images (plain radiographs) as part of my medical decision making.   ____________________________________________   PROCEDURES  Procedure(s) performed: None  Critical Care performed: No  ____________________________________________   INITIAL IMPRESSION / ASSESSMENT AND PLAN / ED COURSE  Pertinent labs & imaging results that were available during my care of the patient were reviewed by me and considered in my medical decision making (see chart for details).  Acute right radicular low back pain. Sclerae x-ray findings with patient. Patient state moderate relief status post Dilaudid. Advised patient to continue previous medications and follow-up family doctor. Patient given a 15 day supply a meloxicam.. ____________________________________________   FINAL CLINICAL IMPRESSION(S) / ED DIAGNOSES  Final diagnoses:  Radicular pain of right lower back  Osteoarthritis of spine with radiculopathy, lumbar region      Sable Feil, PA-C 05/06/15 [place], MD 05/06/15 1241

## 2015-05-06 NOTE — ED Notes (Signed)
NAD noted at time of D/C. Pt taken to the lobby via wheelchair by her daughter.

## 2015-05-06 NOTE — ED Notes (Signed)
Reports lower back pain radiating down right hip and leg

## 2015-05-07 ENCOUNTER — Inpatient Hospital Stay (HOSPITAL_COMMUNITY)
Admission: EM | Admit: 2015-05-07 | Discharge: 2015-05-10 | DRG: 519 | Disposition: A | Discharge status: Home / Self Care | Payer: Medicare Other | Attending: Neurosurgery | Admitting: Neurosurgery

## 2015-05-07 ENCOUNTER — Emergency Department (HOSPITAL_COMMUNITY): Payer: Medicare Other

## 2015-05-07 ENCOUNTER — Encounter (HOSPITAL_COMMUNITY): Payer: Self-pay | Admitting: Emergency Medicine

## 2015-05-07 DIAGNOSIS — Z419 Encounter for procedure for purposes other than remedying health state, unspecified: Secondary | ICD-10-CM

## 2015-05-07 DIAGNOSIS — Z7984 Long term (current) use of oral hypoglycemic drugs: Secondary | ICD-10-CM | POA: Diagnosis not present

## 2015-05-07 DIAGNOSIS — M5126 Other intervertebral disc displacement, lumbar region: Secondary | ICD-10-CM | POA: Diagnosis present

## 2015-05-07 DIAGNOSIS — N179 Acute kidney failure, unspecified: Secondary | ICD-10-CM | POA: Diagnosis not present

## 2015-05-07 DIAGNOSIS — E119 Type 2 diabetes mellitus without complications: Secondary | ICD-10-CM | POA: Diagnosis present

## 2015-05-07 DIAGNOSIS — E7800 Pure hypercholesterolemia, unspecified: Secondary | ICD-10-CM | POA: Diagnosis present

## 2015-05-07 DIAGNOSIS — Z90710 Acquired absence of both cervix and uterus: Secondary | ICD-10-CM | POA: Diagnosis not present

## 2015-05-07 DIAGNOSIS — I10 Essential (primary) hypertension: Secondary | ICD-10-CM | POA: Diagnosis present

## 2015-05-07 DIAGNOSIS — M5116 Intervertebral disc disorders with radiculopathy, lumbar region: Secondary | ICD-10-CM | POA: Diagnosis present

## 2015-05-07 DIAGNOSIS — R52 Pain, unspecified: Secondary | ICD-10-CM | POA: Diagnosis not present

## 2015-05-07 DIAGNOSIS — Z79899 Other long term (current) drug therapy: Secondary | ICD-10-CM

## 2015-05-07 DIAGNOSIS — E876 Hypokalemia: Secondary | ICD-10-CM | POA: Diagnosis not present

## 2015-05-07 DIAGNOSIS — E785 Hyperlipidemia, unspecified: Secondary | ICD-10-CM | POA: Diagnosis present

## 2015-05-07 DIAGNOSIS — Z7982 Long term (current) use of aspirin: Secondary | ICD-10-CM

## 2015-05-07 LAB — BASIC METABOLIC PANEL
ANION GAP: 11 (ref 5–15)
BUN: 31 mg/dL — ABNORMAL HIGH (ref 6–20)
CALCIUM: 9.3 mg/dL (ref 8.9–10.3)
CHLORIDE: 99 mmol/L — AB (ref 101–111)
CO2: 29 mmol/L (ref 22–32)
Creatinine, Ser: 1.18 mg/dL — ABNORMAL HIGH (ref 0.44–1.00)
GFR calc Af Amer: 52 mL/min — ABNORMAL LOW (ref >60)
GFR calc non Af Amer: 45 mL/min — ABNORMAL LOW (ref >60)
GLUCOSE: 175 mg/dL — AB (ref 65–99)
POTASSIUM: 3.2 mmol/L — AB (ref 3.5–5.1)
Sodium: 139 mmol/L (ref 135–145)

## 2015-05-07 LAB — CBC WITH DIFFERENTIAL/PLATELET
Basophils Absolute: 0 10*3/uL (ref 0.0–0.1)
Basophils Relative: 0 %
Eosinophils Absolute: 0 10*3/uL (ref 0.0–0.7)
Eosinophils Relative: 0 %
HEMATOCRIT: 38.7 % (ref 36.0–46.0)
Hemoglobin: 12.9 g/dL (ref 12.0–15.0)
LYMPHS PCT: 19 %
Lymphs Abs: 2.2 10*3/uL (ref 0.7–4.0)
MCH: 30.4 pg (ref 26.0–34.0)
MCHC: 33.3 g/dL (ref 30.0–36.0)
MCV: 91.3 fL (ref 78.0–100.0)
MONO ABS: 1 10*3/uL (ref 0.1–1.0)
MONOS PCT: 8 %
NEUTROS ABS: 8.6 10*3/uL — AB (ref 1.7–7.7)
Neutrophils Relative %: 73 %
Platelets: 236 10*3/uL (ref 150–400)
RBC: 4.24 MIL/uL (ref 3.87–5.11)
RDW: 13.2 % (ref 11.5–15.5)
WBC: 11.8 10*3/uL — ABNORMAL HIGH (ref 4.0–10.5)

## 2015-05-07 MED ORDER — ONDANSETRON HCL 4 MG/2ML IJ SOLN
4.0000 mg | Freq: Four times a day (QID) | INTRAMUSCULAR | Status: DC | PRN
  Administered 2015-05-09: 4 mg via INTRAVENOUS

## 2015-05-07 MED ORDER — ROSUVASTATIN CALCIUM 40 MG PO TABS
40.0000 mg | ORAL_TABLET | Freq: Every day | ORAL | Status: DC
  Administered 2015-05-08 – 2015-05-10 (×2): 40 mg via ORAL
  Filled 2015-05-07 (×3): qty 1

## 2015-05-07 MED ORDER — HYDROMORPHONE HCL 1 MG/ML IJ SOLN
1.0000 mg | Freq: Once | INTRAMUSCULAR | Status: AC
  Administered 2015-05-07: 1 mg via INTRAMUSCULAR
  Filled 2015-05-07: qty 1

## 2015-05-07 MED ORDER — ATENOLOL 25 MG PO TABS
50.0000 mg | ORAL_TABLET | Freq: Every day | ORAL | Status: DC
  Administered 2015-05-08 – 2015-05-10 (×3): 50 mg via ORAL
  Filled 2015-05-07 (×3): qty 2

## 2015-05-07 MED ORDER — INSULIN ASPART 100 UNIT/ML ~~LOC~~ SOLN
0.0000 [IU] | Freq: Three times a day (TID) | SUBCUTANEOUS | Status: DC
  Administered 2015-05-08: 1 [IU] via SUBCUTANEOUS
  Administered 2015-05-08: 2 [IU] via SUBCUTANEOUS
  Administered 2015-05-09: 1 [IU] via SUBCUTANEOUS
  Administered 2015-05-09: 2 [IU] via SUBCUTANEOUS
  Administered 2015-05-10 (×2): 3 [IU] via SUBCUTANEOUS

## 2015-05-07 MED ORDER — INSULIN ASPART 100 UNIT/ML ~~LOC~~ SOLN
0.0000 [IU] | Freq: Every day | SUBCUTANEOUS | Status: DC
  Administered 2015-05-09: 3 [IU] via SUBCUTANEOUS

## 2015-05-07 MED ORDER — SODIUM CHLORIDE 0.9 % IV SOLN
INTRAVENOUS | Status: DC
Start: 2015-05-08 — End: 2015-05-10
  Administered 2015-05-08: via INTRAVENOUS

## 2015-05-07 MED ORDER — VENLAFAXINE HCL ER 75 MG PO CP24
150.0000 mg | ORAL_CAPSULE | Freq: Every day | ORAL | Status: DC
  Administered 2015-05-08 – 2015-05-10 (×2): 150 mg via ORAL
  Filled 2015-05-07 (×2): qty 2

## 2015-05-07 MED ORDER — ASPIRIN EC 81 MG PO TBEC
81.0000 mg | DELAYED_RELEASE_TABLET | Freq: Every day | ORAL | Status: DC
  Administered 2015-05-08 – 2015-05-09 (×3): 81 mg via ORAL
  Filled 2015-05-07 (×3): qty 1

## 2015-05-07 MED ORDER — ACETAMINOPHEN 325 MG PO TABS: 650.0000 mg | ORAL_TABLET | Freq: Four times a day (QID) | ORAL | Status: DC | PRN

## 2015-05-07 MED ORDER — IRBESARTAN 300 MG PO TABS
300.0000 mg | ORAL_TABLET | Freq: Every day | ORAL | Status: DC
  Administered 2015-05-08 – 2015-05-10 (×3): 300 mg via ORAL
  Filled 2015-05-07 (×3): qty 1

## 2015-05-07 MED ORDER — HYDROMORPHONE HCL 1 MG/ML IJ SOLN
1.0000 mg | Freq: Once | INTRAMUSCULAR | Status: AC
  Administered 2015-05-07: 1 mg via INTRAVENOUS
  Filled 2015-05-07: qty 1

## 2015-05-07 MED ORDER — ALUM & MAG HYDROXIDE-SIMETH 200-200-20 MG/5ML PO SUSP: 30.0000 mL | Freq: Four times a day (QID) | ORAL | Status: DC | PRN

## 2015-05-07 MED ORDER — KETOROLAC TROMETHAMINE 60 MG/2ML IM SOLN
60.0000 mg | Freq: Once | INTRAMUSCULAR | Status: AC
  Administered 2015-05-07: 60 mg via INTRAMUSCULAR
  Filled 2015-05-07: qty 2

## 2015-05-07 MED ORDER — HYDROMORPHONE HCL 1 MG/ML IJ SOLN
0.5000 mg | INTRAMUSCULAR | Status: DC | PRN
  Administered 2015-05-08 – 2015-05-09 (×8): 1 mg via INTRAVENOUS
  Filled 2015-05-07 (×8): qty 1

## 2015-05-07 MED ORDER — ACETAMINOPHEN 650 MG RE SUPP: 650.0000 mg | Freq: Four times a day (QID) | RECTAL | Status: DC | PRN

## 2015-05-07 MED ORDER — B COMPLEX-C PO TABS
1.0000 | ORAL_TABLET | Freq: Every day | ORAL | Status: DC
  Administered 2015-05-08 – 2015-05-10 (×2): 1 via ORAL
  Filled 2015-05-07 (×3): qty 1

## 2015-05-07 MED ORDER — KETOROLAC TROMETHAMINE 30 MG/ML IJ SOLN
30.0000 mg | Freq: Once | INTRAMUSCULAR | Status: AC
  Administered 2015-05-07: 30 mg via INTRAVENOUS
  Filled 2015-05-07: qty 1

## 2015-05-07 MED ORDER — ATENOLOL-CHLORTHALIDONE 50-25 MG PO TABS: 1.0000 | ORAL_TABLET | Freq: Every day | ORAL | Status: DC

## 2015-05-07 MED ORDER — OXYCODONE HCL 5 MG PO TABS
5.0000 mg | ORAL_TABLET | ORAL | Status: DC | PRN
  Administered 2015-05-08 (×2): 5 mg via ORAL
  Filled 2015-05-07 (×2): qty 1

## 2015-05-07 MED ORDER — CHLORTHALIDONE 25 MG PO TABS
25.0000 mg | ORAL_TABLET | Freq: Every day | ORAL | Status: DC
  Administered 2015-05-08 – 2015-05-10 (×2): 25 mg via ORAL
  Filled 2015-05-07 (×2): qty 1

## 2015-05-07 MED ORDER — ONDANSETRON HCL 4 MG PO TABS: 4.0000 mg | ORAL_TABLET | Freq: Four times a day (QID) | ORAL | Status: DC | PRN

## 2015-05-07 NOTE — ED Notes (Signed)
Pt returned from MRI °

## 2015-05-07 NOTE — ED Notes (Signed)
Pt off unit with MRI 

## 2015-05-07 NOTE — ED Provider Notes (Signed)
CSN: [id number]     Arrival date & time 05/07/15  1655 History   First MD Initiated Contact with Patient 05/07/15 1723     Chief Complaint  Patient presents with  . Leg Pain     (Consider location/radiation/quality/duration/timing/severity/associated sxs/prior Treatment) HPI Patient reports that she awakened with severe pain in her lower back and right leg on Friday. She reports there was no injury that occurred. She states the pain is intense and seems to start at her lower back and goes down her leg to her calf. It is much worse with certain movements and trying to walk. With walking she feels both weak and pain limited. She has not had any bowel or bladder dysfunction. She denies loss of sensation to the leg. She reports occasionally she's had some minor back pains but never anything similar to this. She had been seen on Friday at the emergency department [place] and given pain medication. She was not getting any pain relief and she was just seen in [facility] the second day. Still she reports that the combination of muscle relaxers, anti-inflammatories and narcotic pain medications or doing almost nothing to relieve her pain. She states she's been unable to sleep and any movements are exquisitely painful. Past Medical History  Diagnosis Date  . Diabetes mellitus without complication ([place])   . Hypertension   . Hypercholesteremia    Past Surgical History  Procedure Laterality Date  . Breast excisional biopsy Left years ago    negative  . Abdominal hysterectomy     Family History  Problem Relation Age of Onset  . Breast cancer Cousin     2nd cousin. 69's   Social History  Substance Use Topics  . Smoking status: Never Smoker   . Smokeless tobacco: None  . Alcohol Use: No   OB History    No data available     Review of Systems 10 Systems reviewed and are negative for acute change except as noted in the HPI.   Allergies  Review of patient's allergies indicates  no known allergies.  Home Medications   Prior to Admission medications   Medication Sig Start Date End Date Taking? Authorizing Provider  meloxicam (MOBIC) 7.5 MG tablet Take 1 tablet (7.5 mg total) by mouth daily. 05/06/15   Sable Feil, PA-C   BP 165/53 mmHg  Pulse 62  Temp(Src) 98.1 F (36.7 C) (Oral)  Resp 18  SpO2 98% Physical Exam  Constitutional: She is oriented to person, place, and time. She appears well-developed and well-nourished.  HENT:  Head: Normocephalic and atraumatic.  Eyes: EOM are normal. Pupils are equal, round, and reactive to light.  Neck: Neck supple.  Cardiovascular: Normal rate, regular rhythm, normal heart sounds and intact distal pulses.   Pulmonary/Chest: Effort normal and breath sounds normal.  Abdominal: Soft. Bowel sounds are normal. She exhibits no distension. There is no tenderness.  Musculoskeletal: She exhibits tenderness. She exhibits no edema.  Patient has severe pain with trying to roll onto her side from being supine. He localizes the pain to her lumbar back on the right. There is a focus of reproducible tenderness right over the SI joint. There is no palpable soft tissue normality. He does not have lower extremity edema or asymmetry. Muscular strength is intact with intact flexion and extension at the ankles. Bilateral patellar reflexes are 2+. Patient does not endorse sensation difference to light touch.  Neurological: She is alert and oriented to person, place, and time. She has normal  strength. Coordination normal. GCS eye subscore is 4. GCS verbal subscore is 5. GCS motor subscore is 6.  Skin: Skin is warm, dry and intact.  Psychiatric: She has a normal mood and affect.     ED Course  Procedures (including critical care time) Labs Review Labs Reviewed  BASIC METABOLIC PANEL - Abnormal; Notable for the following:    Potassium 3.2 (*)    Chloride 99 (*)    Glucose, Bld 175 (*)    BUN 31 (*)    Creatinine, Ser 1.18 (*)    GFR calc  non Af Amer 45 (*)    GFR calc Af Amer 52 (*)    All other components within normal limits  CBC WITH DIFFERENTIAL/PLATELET - Abnormal; Notable for the following:    WBC 11.8 (*)    Neutro Abs 8.6 (*)    All other components within normal limits    Imaging Review Dg Lumbar Spine Complete  05/06/2015  CLINICAL DATA:  Mid back pain EXAM: LUMBAR SPINE - COMPLETE 4+ VIEW COMPARISON:  CT abdomen pelvis dated 07/28/2013 FINDINGS: Five lumbar type vertebral bodies. Normal lumbar lordosis. No evidence of fracture or dislocation. Vertebral body heights are maintained. Mild to moderate multilevel degenerative changes. Visualized bony pelvis appears intact. Vascular calcifications. IMPRESSION: No fracture or dislocation is seen. Electronically Signed   By: Julian Hy M.D.   On: 05/06/2015 11:22   Mr Lumbar Spine Wo Contrast  05/07/2015  CLINICAL DATA:  Spasms down the right leg since Friday (mention of left leg weakness in the chart is an error) EXAM: MRI LUMBAR SPINE WITHOUT CONTRAST TECHNIQUE: Multiplanar, multisequence MR imaging of the lumbar spine was performed. No intravenous contrast was administered. COMPARISON:  None. FINDINGS: No marrow signal abnormality suggestive of fracture, infection, or neoplasm. Normal conus signal and morphology. No perispinal abnormality to explain back pain. A nodule within the right adrenal gland measuring 12 mm is stable from 2015 abdominal CT and presumably an adenoma given stability. Degenerative changes: L1-L2: Disc narrowing and bulging with small endplate spurs. Negative facets. No herniation or impingement. L2-L3: Greatest level of disc narrowing with bulging and endplate spurs greater to the right. The ventral thecal sac is flattened, greater on the right, without impingement. Small facet joint fluid and mild retrolisthesis. L3-L4: Right foraminal protrusion with mass effect on the right L3 nerve. The canal and left foramen are patent. Facet arthropathy with  spurring and small joint fluid. L4-L5: Disc bulging with left foraminal annular fissure. Facet arthropathy with spurring and joint fluid greater on the left. No impingement. L5-S1:Negative disc. Facet arthropathy with spurring greater on the right. No impingement. IMPRESSION: 1. L3-4 right foraminal disc protrusion with L3 impingement. 2. Noncompressive degenerative disc and facet disease is described above. Electronically Signed   By: Monte Fantasia M.D.   On: 05/07/2015 19:26   I have personally reviewed and evaluated these images and lab results as part of my medical decision-making.   EKG Interpretation None     Consult:Dr. Vertell Limber of neurosurgery, patient may benefit for control of intractable pain with planned surgical intervention Tuesday. MDM   Final diagnoses:  Herniated nucleus pulposus, L3-4 right  Intractable pain   Patient presents with 3 days of pain radiating down her buttock into her calf. Pain have become debilitating and patient was unable to walk. She had been seen at 2 other emergency departments and treated with maximal pain control medications and steroids. She was not getting adequate relief for home function. She  presented this evening and MRI was obtained identifying a herniated disc at L3. This is consistent with the patient's pain pattern. Neurosurgery was consult at this point time plan will be for admission for pain control and surgical intervention Tuesday.  Charlesetta Shanks, MD 05/07/15 2231

## 2015-05-07 NOTE — Consult Note (Addendum)
Reason for Consult:lumbar disc herniation Referring Physician: Jarita Raval is an 74 y.o. female.  HPI: Patient reports that she awakened with severe pain in her lower back and right leg on Friday. She reports there was no injury that occurred. She states the pain is intense and seems to start at her lower back and goes down her leg to her calf. It is much worse with certain movements and trying to walk. With walking she feels both weak and pain limited. She has not had any bowel or bladder dysfunction. She denies loss of sensation to the leg. She reports occasionally she's had some minor back pains but never anything similar to this. She had been seen on Friday at the emergency department [place] and given pain medication. She was not getting any pain relief and she was just seen in [facility] the second day. Still she reports that the combination of muscle relaxers, anti-inflammatories and narcotic pain medications or doing almost nothing to relieve her pain. She states she's been unable to sleep and any movements are exquisitely painful.  Her leg gives way when she tries to walk.  She says the pain goes in her right buttock to the knee, occasionally below the knee, but never into the foot.  Past Medical History  Diagnosis Date  . Diabetes mellitus without complication ([place])   . Hypertension   . Hypercholesteremia     Past Surgical History  Procedure Laterality Date  . Breast excisional biopsy Left years ago    negative  . Abdominal hysterectomy      Family History  Problem Relation Age of Onset  . Breast cancer Cousin     2nd cousin. 40's    Social History:  reports that she has never smoked. She does not have any smokeless tobacco history on file. She reports that she does not drink alcohol or use illicit drugs.  Allergies: No Known Allergies  Medications: I have reviewed the patient's current medications.  Results for orders placed or performed during the  hospital encounter of 05/07/15 (from the past 48 hour(s))  Basic metabolic panel     Status: Abnormal   Collection Time: 05/07/15  9:43 PM  Result Value Ref Range   Sodium 139 135 - 145 mmol/L   Potassium 3.2 (L) 3.5 - 5.1 mmol/L   Chloride 99 (L) 101 - 111 mmol/L   CO2 29 22 - 32 mmol/L   Glucose, Bld 175 (H) 65 - 99 mg/dL   BUN 31 (H) 6 - 20 mg/dL   Creatinine, Ser 1.18 (H) 0.44 - 1.00 mg/dL   Calcium 9.3 8.9 - 10.3 mg/dL   GFR calc non Af Amer 45 (L) >60 mL/min   GFR calc Af Amer 52 (L) >60 mL/min    Comment: (NOTE) The eGFR has been calculated using the CKD EPI equation. This calculation has not been validated in all clinical situations. eGFR's persistently <60 mL/min signify possible Chronic Kidney Disease.    Anion gap 11 5 - 15  CBC with Differential     Status: Abnormal   Collection Time: 05/07/15  9:43 PM  Result Value Ref Range   WBC 11.8 (H) 4.0 - 10.5 K/uL   RBC 4.24 3.87 - 5.11 MIL/uL   Hemoglobin 12.9 12.0 - 15.0 g/dL   HCT 38.7 36.0 - 46.0 %   MCV 91.3 78.0 - 100.0 fL   MCH 30.4 26.0 - 34.0 pg   MCHC 33.3 30.0 - 36.0 g/dL   RDW  13.2 11.5 - 15.5 %   Platelets 236 150 - 400 K/uL   Neutrophils Relative % 73 %   Neutro Abs 8.6 (H) 1.7 - 7.7 K/uL   Lymphocytes Relative 19 %   Lymphs Abs 2.2 0.7 - 4.0 K/uL   Monocytes Relative 8 %   Monocytes Absolute 1.0 0.1 - 1.0 K/uL   Eosinophils Relative 0 %   Eosinophils Absolute 0.0 0.0 - 0.7 K/uL   Basophils Relative 0 %   Basophils Absolute 0.0 0.0 - 0.1 K/uL    Dg Lumbar Spine Complete  05/06/2015  CLINICAL DATA:  Mid back pain EXAM: LUMBAR SPINE - COMPLETE 4+ VIEW COMPARISON:  CT abdomen pelvis dated 07/28/2013 FINDINGS: Five lumbar type vertebral bodies. Normal lumbar lordosis. No evidence of fracture or dislocation. Vertebral body heights are maintained. Mild to moderate multilevel degenerative changes. Visualized bony pelvis appears intact. Vascular calcifications. IMPRESSION: No fracture or dislocation is seen.  Electronically Signed   By: Julian Hy M.D.   On: 05/06/2015 11:22   Mr Lumbar Spine Wo Contrast  05/07/2015  CLINICAL DATA:  Spasms down the right leg since Friday (mention of left leg weakness in the chart is an error) EXAM: MRI LUMBAR SPINE WITHOUT CONTRAST TECHNIQUE: Multiplanar, multisequence MR imaging of the lumbar spine was performed. No intravenous contrast was administered. COMPARISON:  None. FINDINGS: No marrow signal abnormality suggestive of fracture, infection, or neoplasm. Normal conus signal and morphology. No perispinal abnormality to explain back pain. A nodule within the right adrenal gland measuring 12 mm is stable from 2015 abdominal CT and presumably an adenoma given stability. Degenerative changes: L1-L2: Disc narrowing and bulging with small endplate spurs. Negative facets. No herniation or impingement. L2-L3: Greatest level of disc narrowing with bulging and endplate spurs greater to the right. The ventral thecal sac is flattened, greater on the right, without impingement. Small facet joint fluid and mild retrolisthesis. L3-L4: Right foraminal protrusion with mass effect on the right L3 nerve. The canal and left foramen are patent. Facet arthropathy with spurring and small joint fluid. L4-L5: Disc bulging with left foraminal annular fissure. Facet arthropathy with spurring and joint fluid greater on the left. No impingement. L5-S1:Negative disc. Facet arthropathy with spurring greater on the right. No impingement. IMPRESSION: 1. L3-4 right foraminal disc protrusion with L3 impingement. 2. Noncompressive degenerative disc and facet disease is described above. Electronically Signed   By: Monte Fantasia M.D.   On: 05/07/2015 19:26    Review of Systems - Negative except as above    Blood pressure 165/53, pulse 62, temperature 98.1 F (36.7 C), temperature source Oral, resp. rate 18, SpO2 98 %. Physical Exam  Constitutional: She is oriented to person, place, and time. She  appears well-developed and well-nourished.  HENT:  Head: Normocephalic and atraumatic.  Eyes: Conjunctivae and EOM are normal. Pupils are equal, round, and reactive to light.  Neck: Normal range of motion. Neck supple.  Cardiovascular: Normal rate and regular rhythm.   Respiratory: Effort normal and breath sounds normal.  GI: Soft. Bowel sounds are normal.  Musculoskeletal: Normal range of motion.  Neurological: She is oriented to person, place, and time. She has normal strength. She displays no atrophy and no tremor. No cranial nerve deficit or sensory deficit. She exhibits normal muscle tone. She displays no seizure activity. GCS eye subscore is 4. GCS verbal subscore is 5. GCS motor subscore is 6. She displays no Babinski's sign on the right side. She displays no Babinski's sign on the  left side.  Reflex Scores:      Patellar reflexes are 1+ on the right side. Patient has markedly positive straight leg raise on the right at 15 degrees.  She has dysesthesias in a right L 3 distribution.    Assessment/Plan: Patient has a right L 3 radiculopathy with severe right leg pain due to a far lateral disc herniation at L 34 on the right.  She will need surgery and we will tentatively plan for this on 05/09/15.  Peggyann Shoals, MD 05/07/2015, 10:24 PM

## 2015-05-07 NOTE — H&P (Signed)
[facility] Admission History and Physical       Elizabeth Mcmillan UXL:[id number] DOB: [birth year] DOA: 05/07/2015  Referring physician: EDP PCP: No primary care provider on file.  Specialists:   Chief Complaint: Severe Low Back Pain  HPI: Elizabeth Mcmillan is a 74 y.o. female with a history of DM2, HTN, hyperlipidemia who presents to the ED with complaints of intractable Low Back pain radiating into her right hip and down her right leg X 5 days.   The pain is sharp and burning and rated at a 10/10.   She was seen at the [facility] ED on 10/15 and evaluated and prescribed Hydrocodone/APAP, a Prednisone taper, and Robaxin however she had no relief of her pain.  She has had difficulty walking as well.   She denies any history of trauma or overexertion.   An MRI of the Lumbosacral spine was performed and revealed an L3-L4 Foraminal Disc Protrusion with L-3 Impingement.  Dr. Vertell Limber of Neurosurgery was consulted and patient was referred for medical admission with plans for possible surgery on Monday 05/08/2015  Review of Systems:  Constitutional: No Weight Loss, No Weight Gain, Night Sweats, Fevers, Chills, Dizziness, Light Headedness, Fatigue, or Generalized Weakness HEENT: No Headaches, Difficulty Swallowing,Tooth/Dental Problems,Sore Throat,  No Sneezing, Rhinitis, Ear Ache, Nasal Congestion, or Post Nasal Drip,  Cardio-vascular:  No Chest pain, Orthopnea, PND, Edema in Lower Extremities, Anasarca, Dizziness, Palpitations  Resp: No Dyspnea, No DOE, No Productive Cough, No Non-Productive Cough, No Hemoptysis, No Wheezing.    GI: No Heartburn, Indigestion, Abdominal Pain, Nausea, Vomiting, Diarrhea, Constipation, Hematemesis, Hematochezia, Melena, Change in Bowel Habits,  Loss of Appetite  GU: No Dysuria, No Change in Color of Urine, No Urgency or Urinary Frequency, No Flank pain.  Musculoskeletal: No Joint Pain or Swelling, No Decreased Range of Motion, +Low Back Pain.  Neurologic: No Syncope, No  Seizures, +Right Leg Muscle Weakness, Paresthesia, Vision Disturbance or Loss, No Diplopia, No Vertigo, No Difficulty Walking,  Skin: No Rash or Lesions. Psych: No Change in Mood or Affect, No Depression or Anxiety, No Memory loss, No Confusion, or Hallucinations   Past Medical History  Diagnosis Date  . Diabetes mellitus without complication ([place])   . Hypertension   . Hypercholesteremia      Past Surgical History  Procedure Laterality Date  . Breast excisional biopsy Left years ago    negative  . Abdominal hysterectomy          Prior to Admission medications   Medication Sig Start Date End Date Taking? Authorizing Provider  acetaminophen (TYLENOL) 500 MG tablet Take 1,000 mg by mouth every 6 (six) hours as needed (pain).   Yes Historical Provider, MD  aspirin EC 81 MG tablet Take 81 mg by mouth at bedtime.   Yes Historical Provider, MD  atenolol-chlorthalidone (TENORETIC) 50-25 MG tablet Take 1 tablet by mouth daily.   Yes Historical Provider, MD  B Complex-C (SUPER B COMPLEX/VITAMIN C PO) Take 1 tablet by mouth daily.   Yes Historical Provider, MD  CALCIUM PO Take 1 tablet by mouth daily.   Yes Historical Provider, MD  diphenhydramine-acetaminophen (TYLENOL PM) 25-500 MG TABS tablet Take 1 tablet by mouth at bedtime.   Yes Historical Provider, MD  HYDROcodone-acetaminophen (NORCO/VICODIN) 5-325 MG tablet Take 1 tablet by mouth every 6 (six) hours as needed (pain).   Yes Historical Provider, MD  irbesartan (AVAPRO) 300 MG tablet Take 300 mg by mouth daily.   Yes Historical Provider, MD  meloxicam (MOBIC) 7.5  MG tablet Take 1 tablet (7.5 mg total) by mouth daily. 05/06/15  Yes Sable Feil, PA-C  Menthol, Topical Analgesic, (BIOFREEZE EX) Apply 1 application topically 2 (two) times daily as needed (pain).   Yes Historical Provider, MD  metFORMIN (GLUCOPHAGE-XR) 750 MG 24 hr tablet Take 750 mg by mouth 2 (two) times daily. Morning and bedtime   Yes Historical Provider, MD    methocarbamol (ROBAXIN) 750 MG tablet Take 750 mg by mouth 4 (four) times daily as needed for muscle spasms.   Yes Historical Provider, MD  naproxen sodium (ALEVE) 220 MG tablet Take 440 mg by mouth daily as needed (pain).   Yes Historical Provider, MD  predniSONE (DELTASONE) 20 MG tablet Take 40 mg by mouth daily. 5 day course started 05/05/15   Yes Historical Provider, MD  rosuvastatin (CRESTOR) 40 MG tablet Take 40 mg by mouth daily.   Yes Historical Provider, MD  venlafaxine XR (EFFEXOR-XR) 150 MG 24 hr capsule Take 150 mg by mouth daily.   Yes Historical Provider, MD     No Known Allergies    Social History:  reports that she has never smoked. She does not have any smokeless tobacco history on file. She reports that she does not drink alcohol or use illicit drugs.    Family History  Problem Relation Age of Onset  . Breast cancer Cousin     2nd cousin. 40's       Physical Exam:  GEN:  Pleasant  74 y.o. female examined and in no acute distress; cooperative with exam Filed Vitals:   05/07/15 2030 05/07/15 2045 05/07/15 2115 05/07/15 2145  BP: 154/57 160/53 130/93 165/53  Pulse: 70 69 64 62  Temp:      TempSrc:      Resp:      SpO2: 95% 95% 99% 98%   Blood pressure 165/53, pulse 62, temperature 98.1 F (36.7 C), temperature source Oral, resp. rate 18, SpO2 98 %. PSYCH: SHe is alert and oriented x4; does not appear anxious does not appear depressed; affect is normal HEENT: Normocephalic and Atraumatic, Mucous membranes pink; PERRLA; EOM intact; Fundi:  Benign;  No scleral icterus, Nares: Patent, Oropharynx: Clear, Edentulous or Fair Dentition,    Neck:  FROM, No Cervical Lymphadenopathy nor Thyromegaly or Carotid Bruit; No JVD; Breasts:: Not examined CHEST WALL: No tenderness CHEST: Normal respiration, clear to auscultation bilaterally HEART: Regular rate and rhythm; no murmurs rubs or gallops BACK: No kyphosis or scoliosis; No CVA tenderness ABDOMEN: Positive Bowel  Sounds, Scaphoid, Obese, Soft Non-Tender, No Rebound or Guarding; No Masses, No Organomegaly, No Pannus; No Intertriginous candida. Rectal Exam: Not done EXTREMITIES: No Bone or Joint Deformity; Age-Appropriate Arthropathy of the Hands and knees; No Cyanosis, Clubbing, or Edema; No Ulcerations. Genitalia: not examined PULSES: 2+ and symmetric SKIN: Normal hydration no rash or ulceration  CNS:  Alert and Oriented x 4, No Focal Deficits Mental Status:  Alert, Oriented, Thought Content Appropriate. Speech Fluent without evidence of Aphasia. Able to follow 3 step commands without difficulty.  In No obvious pain.   Cranial Nerves:  II: Discs flat bilaterally; Visual fields Intact, or Decreased peripheral vision to the left or right. Pupils equal and reactive.    III,IV, VI: Extra-ocular motions intact bilaterally    V,VII: smile symmetric, facial light touch sensation normal bilaterally    VIII: hearing intact or decreasesd bilaterally    IX,X: gag reflex present    XI: bilateral shoulder shrug    XII: midline  tongue extension   Motor:  Right:  Upper extremity 5/5     Left:  Upper extremity 5/5     Right:  Lower extremity 5/5    Left:  Lower extremity 5/5     Tone and Bulk:  normal tone throughout; no atrophy noted   Sensory:  Pinprick and light touch intact throughout, bilaterally   Deep Tendon Reflexes: 2+ and symmetric throughout   Plantars/ Babinski:  Right: equivocal or upgoing or normal Left: equivocal or upgoing or normal    Cerebellar:  Finger to nose with or without difficulty.   Gait: deferred    Vascular: pulses palpable throughout    Labs on Admission:  Basic Metabolic Panel:  Recent Labs Lab 05/07/15 2143  NA 139  K 3.2*  CL 99*  CO2 29  GLUCOSE 175*  BUN 31*  CREATININE 1.18*  CALCIUM 9.3   Liver Function Tests: No results for input(s): AST, ALT, ALKPHOS, BILITOT, PROT, ALBUMIN in the last 168 hours. No results for input(s): LIPASE, AMYLASE in the last  168 hours. No results for input(s): AMMONIA in the last 168 hours. CBC:  Recent Labs Lab 05/07/15 2143  WBC 11.8*  NEUTROABS 8.6*  HGB 12.9  HCT 38.7  MCV 91.3  PLT 236   Cardiac Enzymes: No results for input(s): CKTOTAL, CKMB, CKMBINDEX, TROPONINI in the last 168 hours.  BNP (last 3 results) No results for input(s): BNP in the last 8760 hours.  ProBNP (last 3 results) No results for input(s): PROBNP in the last 8760 hours.  CBG: No results for input(s): GLUCAP in the last 168 hours.  Radiological Exams on Admission: Dg Lumbar Spine Complete  05/06/2015  CLINICAL DATA:  Mid back pain EXAM: LUMBAR SPINE - COMPLETE 4+ VIEW COMPARISON:  CT abdomen pelvis dated 07/28/2013 FINDINGS: Five lumbar type vertebral bodies. Normal lumbar lordosis. No evidence of fracture or dislocation. Vertebral body heights are maintained. Mild to moderate multilevel degenerative changes. Visualized bony pelvis appears intact. Vascular calcifications. IMPRESSION: No fracture or dislocation is seen. Electronically Signed   By: Julian Hy M.D.   On: 05/06/2015 11:22   Mr Lumbar Spine Wo Contrast  05/07/2015  CLINICAL DATA:  Spasms down the right leg since Friday (mention of left leg weakness in the chart is an error) EXAM: MRI LUMBAR SPINE WITHOUT CONTRAST TECHNIQUE: Multiplanar, multisequence MR imaging of the lumbar spine was performed. No intravenous contrast was administered. COMPARISON:  None. FINDINGS: No marrow signal abnormality suggestive of fracture, infection, or neoplasm. Normal conus signal and morphology. No perispinal abnormality to explain back pain. A nodule within the right adrenal gland measuring 12 mm is stable from 2015 abdominal CT and presumably an adenoma given stability. Degenerative changes: L1-L2: Disc narrowing and bulging with small endplate spurs. Negative facets. No herniation or impingement. L2-L3: Greatest level of disc narrowing with bulging and endplate spurs greater to  the right. The ventral thecal sac is flattened, greater on the right, without impingement. Small facet joint fluid and mild retrolisthesis. L3-L4: Right foraminal protrusion with mass effect on the right L3 nerve. The canal and left foramen are patent. Facet arthropathy with spurring and small joint fluid. L4-L5: Disc bulging with left foraminal annular fissure. Facet arthropathy with spurring and joint fluid greater on the left. No impingement. L5-S1:Negative disc. Facet arthropathy with spurring greater on the right. No impingement. IMPRESSION: 1. L3-4 right foraminal disc protrusion with L3 impingement. 2. Noncompressive degenerative disc and facet disease is described above. Electronically Signed  By: Monte Fantasia M.D.   On: 05/07/2015 19:26       Assessment/Plan:   74 y.o. female with  Active Problems:    1.    Lumbar disc prolapse with compression radiculopathy   Seen by Neurosurgery Dr Vertell Limber   Probable Surgery tomorrow 05/08/2015   NPO after Midnight   Pain Control PRN with IV Dilaudid      2.    Intractable pain- due to #1   Pain Control PRN with IV Dilaudid   Surgery for Decompression      3.    Diabetes mellitus without complication (HCC)   Hold Metformin   SSI coverage PRN   Check HbA1C     4.     Hypertension   Continue Avapro Rx as BP Tolerates        5.     Hypercholesteremia   Continue Rosuvastatin Rx       6.     DVT Prophylaxis   SCDs    Code Status:     FULL CODE     Family Communication:   Family at Bedside       Disposition Plan:    Inpatient  Status        Time spent: [facility] Pager [phone]   If [place] Please Contact the Day Rounding Team MD for [facility]  If 7PM-7AM, Please Contact Night-Floor Coverage  [email or web address] Password [email or web address] 05/07/2015, 10:41 PM     ADDENDUM:   Patient was seen and examined on 05/07/2015

## 2015-05-07 NOTE — ED Notes (Signed)
Attempted report 

## 2015-05-07 NOTE — Progress Notes (Signed)
Patient arrived from ED via tech complaining of back pain. Will administer pain medicine shortly. Oriented to room and equipment. Call bell within reach. Will continue to monitor. Joaquin Bend E, RN 05/07/2015 11:57 PM

## 2015-05-07 NOTE — ED Notes (Signed)
C/o spasms down R leg since Friday.  States lower back doesn't hurt at this time but feels that pain is coming from lower back.  Seen at hospital in [place] on Friday and in [place] yesterday.  States Rx's and IM pain medications are not helping.

## 2015-05-08 LAB — CBC
HCT: 38.8 % (ref 36.0–46.0)
Hemoglobin: 12.4 g/dL (ref 12.0–15.0)
MCH: 29.4 pg (ref 26.0–34.0)
MCHC: 32 g/dL (ref 30.0–36.0)
MCV: 91.9 fL (ref 78.0–100.0)
PLATELETS: 240 10*3/uL (ref 150–400)
RBC: 4.22 MIL/uL (ref 3.87–5.11)
RDW: 13.2 % (ref 11.5–15.5)
WBC: 11.7 10*3/uL — AB (ref 4.0–10.5)

## 2015-05-08 LAB — GLUCOSE, CAPILLARY
GLUCOSE-CAPILLARY: 118 mg/dL — AB (ref 65–99)
GLUCOSE-CAPILLARY: 145 mg/dL — AB (ref 65–99)
GLUCOSE-CAPILLARY: 158 mg/dL — AB (ref 65–99)
Glucose-Capillary: 152 mg/dL — ABNORMAL HIGH (ref 65–99)
Glucose-Capillary: 187 mg/dL — ABNORMAL HIGH (ref 65–99)

## 2015-05-08 LAB — BASIC METABOLIC PANEL
ANION GAP: 9 (ref 5–15)
BUN: 32 mg/dL — ABNORMAL HIGH (ref 6–20)
CO2: 30 mmol/L (ref 22–32)
CREATININE: 0.96 mg/dL (ref 0.44–1.00)
Calcium: 8.9 mg/dL (ref 8.9–10.3)
Chloride: 99 mmol/L — ABNORMAL LOW (ref 101–111)
GFR calc Af Amer: >60 mL/min (ref >60)
GFR, EST NON AFRICAN AMERICAN: 58 mL/min — AB (ref >60)
GLUCOSE: 128 mg/dL — AB (ref 65–99)
POTASSIUM: 3.4 mmol/L — AB (ref 3.5–5.1)
SODIUM: 138 mmol/L (ref 135–145)

## 2015-05-08 MED ORDER — GLUCERNA SHAKE PO LIQD
237.0000 mL | Freq: Two times a day (BID) | ORAL | Status: DC
  Administered 2015-05-10: 237 mL via ORAL
  Filled 2015-05-08 (×6): qty 237

## 2015-05-08 MED ORDER — POTASSIUM CHLORIDE CRYS ER 20 MEQ PO TBCR
40.0000 meq | EXTENDED_RELEASE_TABLET | Freq: Once | ORAL | Status: AC
  Administered 2015-05-08: 40 meq via ORAL
  Filled 2015-05-08: qty 2

## 2015-05-08 MED ORDER — SENNA 8.6 MG PO TABS
1.0000 | ORAL_TABLET | Freq: Two times a day (BID) | ORAL | Status: DC
  Administered 2015-05-08 – 2015-05-10 (×5): 8.6 mg via ORAL
  Filled 2015-05-08 (×5): qty 1

## 2015-05-08 NOTE — Progress Notes (Signed)
Subjective: Patient reports "I'm doing ok"  Objective: Vital signs in last 24 hours: Temp:  [98 F (36.7 C)-98.6 F (37 C)] 98 F (36.7 C) (10/17 0525) Pulse Rate:  [56-71] 62 (10/17 0525) Resp:  [16-18] 18 (10/17 0525) BP: (111-174)/(53-93) 159/72 mmHg (10/17 0525) SpO2:  [89 %-100 %] 100 % (10/17 0525) Weight:  [58.922 kg (129 lb 14.4 oz)] 58.922 kg (129 lb 14.4 oz) (10/16 2349)  Intake/Output from previous day:   Intake/Output this shift:    Alert, conversant. Reports pain controlled at present, but right lumbar, buttock and RLE pain prevented ambulation on Friday. Good strength. Pt aware of plan for microdiscectomy tomorrow and agrees with plan.  Pain persists in right leg.  Lab Results:  Recent Labs  05/07/15 2143 05/08/15 0417  WBC 11.8* 11.7*  HGB 12.9 12.4  HCT 38.7 38.8  PLT 236 240   BMET  Recent Labs  05/07/15 2143 05/08/15 0417  NA 139 138  K 3.2* 3.4*  CL 99* 99*  CO2 29 30  GLUCOSE 175* 128*  BUN 31* 32*  CREATININE 1.18* 0.96  CALCIUM 9.3 8.9    Studies/Results: Dg Lumbar Spine Complete  05/06/2015  CLINICAL DATA:  Mid back pain EXAM: LUMBAR SPINE - COMPLETE 4+ VIEW COMPARISON:  CT abdomen pelvis dated 07/28/2013 FINDINGS: Five lumbar type vertebral bodies. Normal lumbar lordosis. No evidence of fracture or dislocation. Vertebral body heights are maintained. Mild to moderate multilevel degenerative changes. Visualized bony pelvis appears intact. Vascular calcifications. IMPRESSION: No fracture or dislocation is seen. Electronically Signed   By: Julian Hy M.D.   On: 05/06/2015 11:22   Mr Lumbar Spine Wo Contrast  05/07/2015  CLINICAL DATA:  Spasms down the right leg since Friday (mention of left leg weakness in the chart is an error) EXAM: MRI LUMBAR SPINE WITHOUT CONTRAST TECHNIQUE: Multiplanar, multisequence MR imaging of the lumbar spine was performed. No intravenous contrast was administered. COMPARISON:  None. FINDINGS: No marrow  signal abnormality suggestive of fracture, infection, or neoplasm. Normal conus signal and morphology. No perispinal abnormality to explain back pain. A nodule within the right adrenal gland measuring 12 mm is stable from 2015 abdominal CT and presumably an adenoma given stability. Degenerative changes: L1-L2: Disc narrowing and bulging with small endplate spurs. Negative facets. No herniation or impingement. L2-L3: Greatest level of disc narrowing with bulging and endplate spurs greater to the right. The ventral thecal sac is flattened, greater on the right, without impingement. Small facet joint fluid and mild retrolisthesis. L3-L4: Right foraminal protrusion with mass effect on the right L3 nerve. The canal and left foramen are patent. Facet arthropathy with spurring and small joint fluid. L4-L5: Disc bulging with left foraminal annular fissure. Facet arthropathy with spurring and joint fluid greater on the left. No impingement. L5-S1:Negative disc. Facet arthropathy with spurring greater on the right. No impingement. IMPRESSION: 1. L3-4 right foraminal disc protrusion with L3 impingement. 2. Noncompressive degenerative disc and facet disease is described above. Electronically Signed   By: Monte Fantasia M.D.   On: 05/07/2015 19:26    Assessment/Plan:   LOS: 1 day  Plan for Right L3-4 far lateral Metrx microdiscectomy on tomorrow (Tuesday). Per Dr. Vertell Limber, NPO after midnight. Permit.   Verdis Prime 05/08/2015, 7:43 AM

## 2015-05-08 NOTE — Progress Notes (Signed)
Initial Nutrition Assessment   INTERVENTION:  Provide Glucerna Shake PO BID, each supplement provides 220 kcal and 20 grams of protein Encouraged adequate healthful PO intake   NUTRITION DIAGNOSIS:   Inadequate oral intake related to poor appetite as evidenced by per patient/family report, energy intake < 75% for > or equal to 1 month, percent weight loss, mild depletion of muscle mass.   GOAL:   Patient will meet greater than or equal to 90% of their needs   MONITOR:   PO intake, Supplement acceptance, Labs, Weight trends, Skin  REASON FOR ASSESSMENT:   Malnutrition Screening Tool    ASSESSMENT:   74 y.o. female with a history of DM2, HTN, hyperlipidemia who presents to the ED with complaints of intractable Low Back pain radiating into her right hip and down her right leg X 5 days. Plan to go to the OR tomorrow for microdiscectomy per neurosurgery.  Pt states that for he past week she has had a poor appetite with early satiety and has been eating about 25% less than usual. She denies any nausea, abdominal pain, or constipation. She reports a usual body weight of 134 lbs. She states she has been eating 75% of meals since admission. Based on her report she has lost 4% of her body weight- not significant. Encouraged adequate healthful PO intake especially with surgery tomorrow. Pt agreeable to trying Glucerna Shakes.  Labs reviewed.   Diet Order:  Diet heart healthy/carb modified Room service appropriate?: Yes; Fluid consistency:: Thin Diet NPO time specified  Skin:  Reviewed, no issues  Last BM:  10/14  Height:   Ht Readings from Last 1 Encounters:  05/07/15 5' (1.524 m)    Weight:   Wt Readings from Last 1 Encounters:  05/07/15 129 lb 14.4 oz (58.922 kg)    Ideal Body Weight:  45.5 kg  BMI:  Body mass index is 25.37 kg/(m^2).  Estimated Nutritional Needs:   Kcal:  1450-1650  Protein:  70-80 grams  Fluid:  1.4-1.6 L/day  EDUCATION NEEDS:   No  education needs identified at this time  [place], LDN Inpatient Clinical Dietitian Pager: [phone] After Hours Pager: [phone]

## 2015-05-08 NOTE — Progress Notes (Signed)
   05/08/15 1253  Clinical Encounter Type  Visited With Patient and family together;Health care provider  Visit Type Initial  Referral From [place] responded to a consult for an advanced directive. Gave information about the process and offered to help complete the process when the form is completed. Chaplain offered prayer. Chaplain support available as needed.   Jeri Lager, Chaplain 05/08/2015 12:55 PM

## 2015-05-08 NOTE — Care Management Note (Signed)
Case Management Note  Patient Details  Name: Elizabeth Mcmillan MRN: [id number] Date of Birth: 14-Jun-1941  Subjective/Objective:                    Action/Plan: Patient admitted with intractable pain in her back and leg. Plan is for Microdiscectomy tomorrow. Await PT/OT recommendations post surgery. CM will continue to follow for discharge needs.   Expected Discharge Date:                  Expected Discharge Plan:  Home/Self Care  In-House Referral:     Discharge planning Services     Post Acute Care Choice:    Choice offered to:     DME Arranged:    DME Agency:     HH Arranged:    HH Agency:     Status of Service:  In process, will continue to follow  Medicare Important Message Given:    Date Medicare IM Given:    Medicare IM give by:    Date Additional Medicare IM Given:    Additional Medicare Important Message give by:     If discussed at [place] of Stay Meetings, dates discussed:    Additional Comments:  Pollie Friar, RN 05/08/2015, 10:33 AM

## 2015-05-08 NOTE — Progress Notes (Signed)
[facility] Progress Note   [name]  ZLD:[id number]  DOB: 05/18/1941  DOA: 05/07/2015 PCP: No primary care provider on file.  Brief narrative: Elizabeth Mcmillan is a 74 y.o. female with a history of hypertension, type 2 diabetes mellitus, hyperlipidemia who presents to the hospital for lower back pain radiating down her right leg for the past 5 days. She was evaluated at [facility] on 10/15 and was prescribed a prednisone taper along with hydrocodone/acetaminophen and Robaxin however her symptoms did not improve. She continues to have difficulty with ambulation. An MRI performed in the ER revealed L3-L4 disc protrusion with L3 impingement and she was subsequently evaluated by neurosurgery. It was recommended that she be admitted to undergo surgery.   Subjective: Continues to have pain and lower back and occasionally down right leg. Relatively well controlled after receiving pain medication. No complaints of bowel or bladder incontinence. No nausea, vomiting, cough, shortness of breath or abdominal pain.  Assessment/Plan: Principal Problem:   Lumbar disc prolapse with compression radiculopathy -Plan to go to the OR tomorrow for microdiscectomy Per neurosurgery -Continue medications for pain control  Active Problems:   Diabetes mellitus without complication  - holding Metformin -Continue mealtime sliding scale insulin  ARF - Appears to be a prerenal -continue IV fluids and follow  Hypokalemia -Replaced today-recheck in a.m.    Hypertension -Continue ARB, chlorthalidone and atenolol    Hypercholesteremia -Continue Crestor   Code Status:     Code Status Orders        Start     Ordered   05/07/15 2349  Full code   Continuous     05/07/15 2348     Family Communication:  Disposition Plan: OR tomorrow DVT prophylaxis: SCDs Consultants:NS Procedures:   Antibiotics: Anti-infectives    None      Objective: Filed Weights   05/07/15 2349  Weight: 58.922 kg (129 lb 14.4 oz)   No intake or output data in the 24 hours ending 05/08/15 1219   Vitals Filed Vitals:   05/07/15 2349 05/08/15 0225 05/08/15 0525 05/08/15 1131  BP: 162/59 164/55 159/72 165/64  Pulse: 59 63 62 56  Temp: 98.6 F (37 C) 98.4 F (36.9 C) 98 F (36.7 C) 98.2 F (36.8 C)  TempSrc: Oral Oral Oral Oral  Resp:  18 18 18   Height: 5' (1.524 m)     Weight: 58.922 kg (129 lb 14.4 oz)     SpO2: 99% 97% 100% 99%    Exam:  General:  Pt is alert, not in acute distress  HEENT: No icterus, No thrush, oral mucosa moist  Cardiovascular: regular rate and rhythm, S1/S2 No murmur  Respiratory: clear to auscultation bilaterally   Abdomen: Soft, +Bowel sounds, non tender, non distended, no guarding  MSK: No LE edema, cyanosis or clubbing  Neuro: CN 2- 12 intact, strength intact in all extremities but limited in Right leg due to pain- straight leg raising + right leg  Data Reviewed: Basic Metabolic Panel:  Recent Labs Lab 05/07/15 2143 05/08/15 0417  NA 139 138  K 3.2* 3.4*  CL 99* 99*  CO2 29 30  GLUCOSE 175* 128*  BUN 31* 32*  CREATININE 1.18* 0.96  CALCIUM 9.3 8.9   Liver Function Tests: No results for input(s): AST, ALT, ALKPHOS, BILITOT, PROT, ALBUMIN in the last 168 hours. No results for input(s): LIPASE, AMYLASE in the last 168 hours. No results for input(s): AMMONIA in the last 168 hours. CBC:  Recent  Labs Lab 05/07/15 2143 05/08/15 0417  WBC 11.8* 11.7*  NEUTROABS 8.6*  --   HGB 12.9 12.4  HCT 38.7 38.8  MCV 91.3 91.9  PLT 236 240   Cardiac Enzymes: No results for input(s): CKTOTAL, CKMB, CKMBINDEX, TROPONINI in the last 168 hours. BNP (last 3 results) No results for input(s): BNP in the last 8760 hours.  ProBNP (last 3 results) No results for input(s): PROBNP in the last 8760 hours.  CBG:  Recent Labs Lab 05/08/15 0022 05/08/15 0627  GLUCAP 158* 118*    No results found for this or any previous  visit (from the past 240 hour(s)).   Studies: Mr Lumbar Spine Wo Contrast  05/07/2015  CLINICAL DATA:  Spasms down the right leg since Friday (mention of left leg weakness in the chart is an error) EXAM: MRI LUMBAR SPINE WITHOUT CONTRAST TECHNIQUE: Multiplanar, multisequence MR imaging of the lumbar spine was performed. No intravenous contrast was administered. COMPARISON:  None. FINDINGS: No marrow signal abnormality suggestive of fracture, infection, or neoplasm. Normal conus signal and morphology. No perispinal abnormality to explain back pain. A nodule within the right adrenal gland measuring 12 mm is stable from 2015 abdominal CT and presumably an adenoma given stability. Degenerative changes: L1-L2: Disc narrowing and bulging with small endplate spurs. Negative facets. No herniation or impingement. L2-L3: Greatest level of disc narrowing with bulging and endplate spurs greater to the right. The ventral thecal sac is flattened, greater on the right, without impingement. Small facet joint fluid and mild retrolisthesis. L3-L4: Right foraminal protrusion with mass effect on the right L3 nerve. The canal and left foramen are patent. Facet arthropathy with spurring and small joint fluid. L4-L5: Disc bulging with left foraminal annular fissure. Facet arthropathy with spurring and joint fluid greater on the left. No impingement. L5-S1:Negative disc. Facet arthropathy with spurring greater on the right. No impingement. IMPRESSION: 1. L3-4 right foraminal disc protrusion with L3 impingement. 2. Noncompressive degenerative disc and facet disease is described above. Electronically Signed   By: Monte Fantasia M.D.   On: 05/07/2015 19:26    Scheduled Meds:  Scheduled Meds: . aspirin EC  81 mg Oral QHS  . atenolol  50 mg Oral Daily  . B-complex with vitamin C  1 tablet Oral Daily  . chlorthalidone  25 mg Oral Daily  . insulin aspart  0-5 Units Subcutaneous QHS  . insulin aspart  0-9 Units Subcutaneous TID WC   . irbesartan  300 mg Oral Daily  . rosuvastatin  40 mg Oral Daily  . senna  1 tablet Oral BID  . venlafaxine XR  150 mg Oral Daily   Continuous Infusions: . sodium chloride 75 mL/hr at 05/08/15 0022    Time spent on care of this patient: 92 min   Equality, MD 05/08/2015, 12:19 PM  LOS: 1 day   [facility] Office  [phone] Pager - Text Page per [email or web address] If 7PM-7AM, please contact night-coverage [email or web address]

## 2015-05-09 ENCOUNTER — Encounter (HOSPITAL_COMMUNITY): Payer: Self-pay | Admitting: Certified Registered Nurse Anesthetist

## 2015-05-09 ENCOUNTER — Encounter (HOSPITAL_COMMUNITY)
Admission: EM | Disposition: A | Discharge status: Home / Self Care | Payer: Self-pay | Source: Home / Self Care | Attending: Internal Medicine

## 2015-05-09 ENCOUNTER — Inpatient Hospital Stay (HOSPITAL_COMMUNITY): Payer: Medicare Other | Admitting: Certified Registered Nurse Anesthetist

## 2015-05-09 ENCOUNTER — Inpatient Hospital Stay (HOSPITAL_COMMUNITY): Payer: Medicare Other

## 2015-05-09 DIAGNOSIS — M5126 Other intervertebral disc displacement, lumbar region: Secondary | ICD-10-CM | POA: Diagnosis present

## 2015-05-09 DIAGNOSIS — M5116 Intervertebral disc disorders with radiculopathy, lumbar region: Principal | ICD-10-CM

## 2015-05-09 DIAGNOSIS — E7800 Pure hypercholesterolemia, unspecified: Secondary | ICD-10-CM

## 2015-05-09 DIAGNOSIS — I10 Essential (primary) hypertension: Secondary | ICD-10-CM

## 2015-05-09 DIAGNOSIS — E119 Type 2 diabetes mellitus without complications: Secondary | ICD-10-CM

## 2015-05-09 HISTORY — PX: LUMBAR LAMINECTOMY/ DECOMPRESSION WITH MET-RX: SHX5959

## 2015-05-09 LAB — BASIC METABOLIC PANEL
Anion gap: 8 (ref 5–15)
BUN: 19 mg/dL (ref 6–20)
CO2: 30 mmol/L (ref 22–32)
Calcium: 9.3 mg/dL (ref 8.9–10.3)
Chloride: 103 mmol/L (ref 101–111)
Creatinine, Ser: 0.81 mg/dL (ref 0.44–1.00)
GFR calc Af Amer: >60 mL/min (ref >60)
GFR calc non Af Amer: >60 mL/min (ref >60)
Glucose, Bld: 146 mg/dL — ABNORMAL HIGH (ref 65–99)
Potassium: 4.3 mmol/L (ref 3.5–5.1)
Sodium: 141 mmol/L (ref 135–145)

## 2015-05-09 LAB — GLUCOSE, CAPILLARY
Glucose-Capillary: 154 mg/dL — ABNORMAL HIGH (ref 65–99)
Glucose-Capillary: 134 mg/dL — ABNORMAL HIGH (ref 65–99)
Glucose-Capillary: 264 mg/dL — ABNORMAL HIGH (ref 65–99)

## 2015-05-09 LAB — HEMOGLOBIN A1C
HEMOGLOBIN A1C: 7 % — AB (ref 4.8–5.6)
Mean Plasma Glucose: 154 mg/dL

## 2015-05-09 LAB — SURGICAL PCR SCREEN
MRSA, PCR: NEGATIVE
Staphylococcus aureus: POSITIVE — AB

## 2015-05-09 SURGERY — LUMBAR LAMINECTOMY/ DECOMPRESSION WITH MET-RX
Anesthesia: General | Site: Spine Lumbar | Laterality: Right

## 2015-05-09 MED ORDER — ZOLPIDEM TARTRATE 5 MG PO TABS: 5.0000 mg | ORAL_TABLET | Freq: Every evening | ORAL | Status: DC | PRN

## 2015-05-09 MED ORDER — HYDROMORPHONE HCL 1 MG/ML IJ SOLN: 0.5000 mg | INTRAMUSCULAR | Status: DC | PRN

## 2015-05-09 MED ORDER — SODIUM CHLORIDE 0.9 % IJ SOLN
3.0000 mL | Freq: Two times a day (BID) | INTRAMUSCULAR | Status: DC
  Administered 2015-05-09: 3 mL via INTRAVENOUS

## 2015-05-09 MED ORDER — ALUM & MAG HYDROXIDE-SIMETH 200-200-20 MG/5ML PO SUSP: 30.0000 mL | Freq: Four times a day (QID) | ORAL | Status: DC | PRN

## 2015-05-09 MED ORDER — FENTANYL CITRATE (PF) 100 MCG/2ML IJ SOLN
INTRAMUSCULAR | Status: DC | PRN
  Administered 2015-05-09 (×2): 100 ug via INTRAVENOUS

## 2015-05-09 MED ORDER — FENTANYL CITRATE (PF) 250 MCG/5ML IJ SOLN
INTRAMUSCULAR | Status: AC
  Filled 2015-05-09: qty 5

## 2015-05-09 MED ORDER — ACETAMINOPHEN 650 MG RE SUPP: 650.0000 mg | RECTAL | Status: DC | PRN

## 2015-05-09 MED ORDER — FENTANYL CITRATE (PF) 100 MCG/2ML IJ SOLN
INTRAMUSCULAR | Status: DC | PRN
  Administered 2015-05-09: 100 ug via INTRAVENOUS

## 2015-05-09 MED ORDER — ACETAMINOPHEN 325 MG PO TABS
650.0000 mg | ORAL_TABLET | ORAL | Status: DC | PRN
  Administered 2015-05-09: 650 mg via ORAL
  Filled 2015-05-09: qty 2

## 2015-05-09 MED ORDER — ROCURONIUM BROMIDE 100 MG/10ML IV SOLN
INTRAVENOUS | Status: DC | PRN
  Administered 2015-05-09: 30 mg via INTRAVENOUS

## 2015-05-09 MED ORDER — EPHEDRINE SULFATE 50 MG/ML IJ SOLN
INTRAMUSCULAR | Status: DC | PRN
  Administered 2015-05-09 (×3): 10 mg via INTRAVENOUS

## 2015-05-09 MED ORDER — METHYLPREDNISOLONE ACETATE 80 MG/ML IJ SUSP
INTRAMUSCULAR | Status: DC | PRN
  Administered 2015-05-09: 80 mg

## 2015-05-09 MED ORDER — SODIUM CHLORIDE 0.9 % IV SOLN: 250.0000 mL | INTRAVENOUS | Status: DC

## 2015-05-09 MED ORDER — LACTATED RINGERS IV SOLN
INTRAVENOUS | Status: DC | PRN
  Administered 2015-05-09: 15:00:00 via INTRAVENOUS

## 2015-05-09 MED ORDER — METHOCARBAMOL 1000 MG/10ML IJ SOLN
500.0000 mg | Freq: Four times a day (QID) | INTRAVENOUS | Status: DC | PRN
  Filled 2015-05-09: qty 5

## 2015-05-09 MED ORDER — FLEET ENEMA 7-19 GM/118ML RE ENEM: 1.0000 | ENEMA | Freq: Once | RECTAL | Status: DC | PRN

## 2015-05-09 MED ORDER — PHENOL 1.4 % MT LIQD: 1.0000 | OROMUCOSAL | Status: DC | PRN

## 2015-05-09 MED ORDER — DOCUSATE SODIUM 100 MG PO CAPS
100.0000 mg | ORAL_CAPSULE | Freq: Two times a day (BID) | ORAL | Status: DC
  Administered 2015-05-09 – 2015-05-10 (×2): 100 mg via ORAL
  Filled 2015-05-09 (×2): qty 1

## 2015-05-09 MED ORDER — SUGAMMADEX SODIUM 200 MG/2ML IV SOLN
INTRAVENOUS | Status: AC
  Filled 2015-05-09: qty 2

## 2015-05-09 MED ORDER — ONDANSETRON HCL 4 MG/2ML IJ SOLN: 4.0000 mg | INTRAMUSCULAR | Status: DC | PRN

## 2015-05-09 MED ORDER — HYDROMORPHONE HCL 1 MG/ML IJ SOLN
INTRAMUSCULAR | Status: AC
  Administered 2015-05-09: 0.5 mg via INTRAVENOUS
  Filled 2015-05-09: qty 1

## 2015-05-09 MED ORDER — 0.9 % SODIUM CHLORIDE (POUR BTL) OPTIME
TOPICAL | Status: DC | PRN
  Administered 2015-05-09: 1000 mL

## 2015-05-09 MED ORDER — THROMBIN 5000 UNITS EX SOLR
CUTANEOUS | Status: DC | PRN
  Administered 2015-05-09 (×2): 5000 [IU] via TOPICAL

## 2015-05-09 MED ORDER — SODIUM CHLORIDE 0.9 % IJ SOLN: 3.0000 mL | INTRAMUSCULAR | Status: DC | PRN

## 2015-05-09 MED ORDER — BISACODYL 10 MG RE SUPP: 10.0000 mg | Freq: Every day | RECTAL | Status: DC | PRN

## 2015-05-09 MED ORDER — SENNOSIDES-DOCUSATE SODIUM 8.6-50 MG PO TABS: 1.0000 | ORAL_TABLET | Freq: Every evening | ORAL | Status: DC | PRN

## 2015-05-09 MED ORDER — OXYCODONE-ACETAMINOPHEN 5-325 MG PO TABS: 1.0000 | ORAL_TABLET | ORAL | Status: DC | PRN

## 2015-05-09 MED ORDER — SUGAMMADEX SODIUM 200 MG/2ML IV SOLN
INTRAVENOUS | Status: DC | PRN
  Administered 2015-05-09: 150 mg via INTRAVENOUS

## 2015-05-09 MED ORDER — BUPIVACAINE HCL (PF) 0.5 % IJ SOLN
INTRAMUSCULAR | Status: DC | PRN
  Administered 2015-05-09: 4 mL

## 2015-05-09 MED ORDER — PHENYLEPHRINE HCL 10 MG/ML IJ SOLN
INTRAMUSCULAR | Status: DC | PRN
  Administered 2015-05-09 (×3): 80 ug via INTRAVENOUS

## 2015-05-09 MED ORDER — KCL IN DEXTROSE-NACL 20-5-0.45 MEQ/L-%-% IV SOLN
INTRAVENOUS | Status: DC
  Filled 2015-05-09 (×3): qty 1000

## 2015-05-09 MED ORDER — HYDROMORPHONE HCL 1 MG/ML IJ SOLN
0.2500 mg | INTRAMUSCULAR | Status: DC | PRN
Start: 2015-05-09 — End: 2015-05-09
  Administered 2015-05-09 (×2): 0.5 mg via INTRAVENOUS

## 2015-05-09 MED ORDER — METHOCARBAMOL 500 MG PO TABS: 500.0000 mg | ORAL_TABLET | Freq: Four times a day (QID) | ORAL | Status: DC | PRN

## 2015-05-09 MED ORDER — PROMETHAZINE HCL 25 MG/ML IJ SOLN: 6.2500 mg | INTRAMUSCULAR | Status: DC | PRN

## 2015-05-09 MED ORDER — CEFAZOLIN SODIUM-DEXTROSE 2-3 GM-% IV SOLR
INTRAVENOUS | Status: DC | PRN
  Administered 2015-05-09: 2 g via INTRAVENOUS

## 2015-05-09 MED ORDER — CEFAZOLIN SODIUM 1-5 GM-% IV SOLN
1.0000 g | Freq: Three times a day (TID) | INTRAVENOUS | Status: AC
  Administered 2015-05-10: 1 g via INTRAVENOUS
  Filled 2015-05-09 (×2): qty 50

## 2015-05-09 MED ORDER — FENTANYL CITRATE (PF) 100 MCG/2ML IJ SOLN
INTRAMUSCULAR | Status: AC
  Filled 2015-05-09: qty 2

## 2015-05-09 MED ORDER — PANTOPRAZOLE SODIUM 40 MG IV SOLR
40.0000 mg | Freq: Every day | INTRAVENOUS | Status: DC
  Administered 2015-05-09: 40 mg via INTRAVENOUS
  Filled 2015-05-09: qty 40

## 2015-05-09 MED ORDER — HYDROCODONE-ACETAMINOPHEN 5-325 MG PO TABS: 1.0000 | ORAL_TABLET | Freq: Four times a day (QID) | ORAL | Status: DC | PRN

## 2015-05-09 MED ORDER — HYDROCODONE-ACETAMINOPHEN 5-325 MG PO TABS: 1.0000 | ORAL_TABLET | ORAL | Status: DC | PRN

## 2015-05-09 MED ORDER — MENTHOL 3 MG MT LOZG: 1.0000 | LOZENGE | OROMUCOSAL | Status: DC | PRN

## 2015-05-09 MED ORDER — LIDOCAINE HCL (CARDIAC) 20 MG/ML IV SOLN
INTRAVENOUS | Status: DC | PRN
  Administered 2015-05-09: 60 mg via INTRAVENOUS

## 2015-05-09 MED ORDER — LIDOCAINE-EPINEPHRINE 1 %-1:100000 IJ SOLN
INTRAMUSCULAR | Status: DC | PRN
  Administered 2015-05-09: 4 mL

## 2015-05-09 SURGICAL SUPPLY — 51 items
BLADE CLIPPER SURG (BLADE) IMPLANT
BLADE SURG 15 STRL LF DISP TIS (BLADE) ×1 IMPLANT
BLADE SURG 15 STRL SS (BLADE) ×2
BUR MATCHSTICK NEURO 3.0 LAGG (BURR) ×3 IMPLANT
CANISTER SUCT 3000ML PPV (MISCELLANEOUS) ×3 IMPLANT
DECANTER SPIKE VIAL GLASS SM (MISCELLANEOUS) ×6 IMPLANT
DERMABOND ADVANCED (GAUZE/BANDAGES/DRESSINGS) ×2
DERMABOND ADVANCED .7 DNX12 (GAUZE/BANDAGES/DRESSINGS) ×1 IMPLANT
DRAPE C-ARM 42X72 X-RAY (DRAPES) ×6 IMPLANT
DRAPE LAPAROTOMY 100X72 PEDS (DRAPES) ×3 IMPLANT
DRAPE MICROSCOPE LEICA (MISCELLANEOUS) ×3 IMPLANT
DRAPE POUCH INSTRU U-SHP 10X18 (DRAPES) ×3 IMPLANT
DRSG OPSITE POSTOP 3X4 (GAUZE/BANDAGES/DRESSINGS) ×3 IMPLANT
DURAPREP 26ML APPLICATOR (WOUND CARE) ×3 IMPLANT
ELECT BLADE 6.5 EXT (BLADE) ×3 IMPLANT
ELECT REM PT RETURN 9FT ADLT (ELECTROSURGICAL) ×3
ELECTRODE REM PT RTRN 9FT ADLT (ELECTROSURGICAL) ×1 IMPLANT
GAUZE SPONGE 4X4 16PLY XRAY LF (GAUZE/BANDAGES/DRESSINGS) IMPLANT
GLOVE BIO SURGEON STRL SZ8 (GLOVE) ×3 IMPLANT
GLOVE BIOGEL PI IND STRL 8 (GLOVE) ×1 IMPLANT
GLOVE BIOGEL PI IND STRL 8.5 (GLOVE) ×1 IMPLANT
GLOVE BIOGEL PI INDICATOR 8 (GLOVE) ×2
GLOVE BIOGEL PI INDICATOR 8.5 (GLOVE) ×2
GLOVE ECLIPSE 8.0 STRL XLNG CF (GLOVE) ×3 IMPLANT
GLOVE EXAM NITRILE LRG STRL (GLOVE) IMPLANT
GLOVE EXAM NITRILE MD LF STRL (GLOVE) IMPLANT
GLOVE EXAM NITRILE XL STR (GLOVE) IMPLANT
GLOVE EXAM NITRILE XS STR PU (GLOVE) IMPLANT
GOWN STRL REUS W/ TWL LRG LVL3 (GOWN DISPOSABLE) IMPLANT
GOWN STRL REUS W/ TWL XL LVL3 (GOWN DISPOSABLE) ×1 IMPLANT
GOWN STRL REUS W/TWL 2XL LVL3 (GOWN DISPOSABLE) ×3 IMPLANT
GOWN STRL REUS W/TWL LRG LVL3 (GOWN DISPOSABLE)
GOWN STRL REUS W/TWL XL LVL3 (GOWN DISPOSABLE) ×2
KIT BASIN OR (CUSTOM PROCEDURE TRAY) ×3 IMPLANT
KIT ROOM TURNOVER OR (KITS) ×3 IMPLANT
NEEDLE HYPO 18GX1.5 BLUNT FILL (NEEDLE) IMPLANT
NEEDLE HYPO 25X1 1.5 SAFETY (NEEDLE) ×3 IMPLANT
NEEDLE SPNL 20GX3.5 QUINCKE YW (NEEDLE) IMPLANT
NS IRRIG 1000ML POUR BTL (IV SOLUTION) ×3 IMPLANT
PACK LAMINECTOMY NEURO (CUSTOM PROCEDURE TRAY) ×3 IMPLANT
PAD ARMBOARD 7.5X6 YLW CONV (MISCELLANEOUS) ×9 IMPLANT
RUBBERBAND STERILE (MISCELLANEOUS) ×6 IMPLANT
SPONGE SURGIFOAM ABS GEL SZ50 (HEMOSTASIS) ×3 IMPLANT
SUT VIC AB 0 CT1 18XCR BRD8 (SUTURE) ×1 IMPLANT
SUT VIC AB 0 CT1 8-18 (SUTURE) ×2
SUT VIC AB 2-0 CT1 18 (SUTURE) ×3 IMPLANT
SUT VIC AB 3-0 SH 8-18 (SUTURE) ×3 IMPLANT
SYR 5ML LL (SYRINGE) IMPLANT
TOWEL OR 17X24 6PK STRL BLUE (TOWEL DISPOSABLE) ×3 IMPLANT
TOWEL OR 17X26 10 PK STRL BLUE (TOWEL DISPOSABLE) ×3 IMPLANT
WATER STERILE IRR 1000ML POUR (IV SOLUTION) ×3 IMPLANT

## 2015-05-09 NOTE — Op Note (Signed)
05/07/2015 - 05/09/2015  5:14 PM  PATIENT:  Elizabeth Mcmillan  74 y.o. female  PRE-OPERATIVE DIAGNOSIS:  Far Lateral Right  L 34 Herniated Disc with radiculopathy  POST-OPERATIVE DIAGNOSIS:  Far Lateral Right  L 34 Herniated Disc with radiculopathy  PROCEDURE:  Procedure(s): Far Lateral Lumbar Three-Four Microdiscectomy WITH MET-RX (Right)  SURGEON:  Surgeon(s) and Role:    * Erline Levine, MD - Primary  PHYSICIAN ASSISTANT:   ASSISTANTS: none   ANESTHESIA:   general  EBL:   Min  BLOOD ADMINISTERED:none  DRAINS: none   LOCAL MEDICATIONS USED:  LIDOCAINE   SPECIMEN:  No Specimen  DISPOSITION OF SPECIMEN:  N/A  COUNTS:  YES  TOURNIQUET:  * No tourniquets in log *  DICTATION: Patient has left L 3 radiculopathy with foraminal and extra-foraminal disc herniation L 3 - 4 right, who was admitted through the ER because of intractable pain.  PROCEDURE: Patient was brought to the operating room and following smooth and uncomplicated induction of general endotracheal anesthesia, patient was placed in prone position on Wilson frame.  Back was prepped and draped in the usual fashion with betadine scrub and Duraprep.  Using C -arm fluoroscopy, the right L3-4 level was localized.  Skin and subcutaneous tissues were infiltrated with lidocaine.  An incision was made and using sequential dilators and the expandable retractor, the right L 3 -4 pars and interspace were exposed.  Using microscope, the right L 3 pars was thinned with the high speed drill and then the lateral aspect was removed with Kerrison rongeurs.  The intertransverse ligament was removed and the lateral aspect of the L 3 nerve was identified.  Multiple free fragments of herniated disc material were removed and a thorough microdiscectomy was performed with removal of multiple compressive fragments of disc material.  The wound was then irrigated.  The course of the nerve root was then palpated and there did not appear to be any  additional compression.  The interspace was bathed in Depo-Medrol and fentanyl. The retractor was removed after hemostasis was assured and the fascia was closed with ) vicryl sutures, the subcutaneous tissues with 2-0 vicryl sutures and the skin edges with 3-0 vicryl sutures.  The wound was dressed with Dermabond and an occlusive dressing.  Patient was extubated and taken to Recovery in stable and satisfactory condition.  Counts were correct at the end of the case.   PLAN OF CARE: Admit to inpatient   PATIENT DISPOSITION:  PACU - hemodynamically stable.   Delay start of Pharmacological VTE agent (>24hrs) due to surgical blood loss or risk of bleeding: yes

## 2015-05-09 NOTE — Progress Notes (Signed)
Pt to OR at this time with SCD's.  Family in room.  Dentures removed and placed on sink in cup.  BP 192/55; p 61; 100% on room air.  Pt reports pain to right leg.  Previously pain at a 4.

## 2015-05-09 NOTE — Progress Notes (Signed)
Patient returned to room from PACU awake and alert. Denied any pain at this time. Safety precautions and orders reviewed with patient. Family at bedside. Will continue to monitor.   Ave Filter, RN

## 2015-05-09 NOTE — Progress Notes (Signed)
Awake, alert, conversant.  Leg pain relieved.  Strength full.  No numbness.

## 2015-05-09 NOTE — Brief Op Note (Signed)
05/07/2015 - 05/09/2015  5:14 PM  PATIENT:  Elizabeth Mcmillan  74 y.o. female  PRE-OPERATIVE DIAGNOSIS:  Far Lateral Right  L 34 Herniated Disc with radiculopathy  POST-OPERATIVE DIAGNOSIS:  Far Lateral Right  L 34 Herniated Disc with radiculopathy  PROCEDURE:  Procedure(s): Far Lateral Lumbar Three-Four Microdiscectomy WITH MET-RX (Right)  SURGEON:  Surgeon(s) and Role:    * Erline Levine, MD - Primary  PHYSICIAN ASSISTANT:   ASSISTANTS: none   ANESTHESIA:   general  EBL:   Min  BLOOD ADMINISTERED:none  DRAINS: none   LOCAL MEDICATIONS USED:  LIDOCAINE   SPECIMEN:  No Specimen  DISPOSITION OF SPECIMEN:  N/A  COUNTS:  YES  TOURNIQUET:  * No tourniquets in log *  DICTATION: Patient has left L 3 radiculopathy with foraminal and extra-foraminal disc herniation L 3 - 4 right, who was admitted through the ER because of intractable pain.  PROCEDURE: Patient was brought to the operating room and following smooth and uncomplicated induction of general endotracheal anesthesia, patient was placed in prone position on Wilson frame.  Back was prepped and draped in the usual fashion with betadine scrub and Duraprep.  Using C -arm fluoroscopy, the right L3-4 level was localized.  Skin and subcutaneous tissues were infiltrated with lidocaine.  An incision was made and using sequential dilators and the expandable retractor, the right L 3 -4 pars and interspace were exposed.  Using microscope, the right L 3 pars was thinned with the high speed drill and then the lateral aspect was removed with Kerrison rongeurs.  The intertransverse ligament was removed and the lateral aspect of the L 3 nerve was identified.  Multiple free fragments of herniated disc material were removed and a thorough microdiscectomy was performed with removal of multiple compressive fragments of disc material.  The wound was then irrigated.  The course of the nerve root was then palpated and there did not appear to be any  additional compression.  The interspace was bathed in Depo-Medrol and fentanyl. The retractor was removed after hemostasis was assured and the fascia was closed with ) vicryl sutures, the subcutaneous tissues with 2-0 vicryl sutures and the skin edges with 3-0 vicryl sutures.  The wound was dressed with Dermabond and an occlusive dressing.  Patient was extubated and taken to Recovery in stable and satisfactory condition.  Counts were correct at the end of the case.   PLAN OF CARE: Admit to inpatient   PATIENT DISPOSITION:  PACU - hemodynamically stable.   Delay start of Pharmacological VTE agent (>24hrs) due to surgical blood loss or risk of bleeding: yes

## 2015-05-09 NOTE — Anesthesia Procedure Notes (Signed)
Procedure Name: Intubation Date/Time: 05/09/2015 3:30 PM Performed by: Raphael Gibney T Pre-anesthesia Checklist: Patient identified, Timeout performed, Emergency Drugs available, Suction available and Patient being monitored Patient Re-evaluated:Patient Re-evaluated prior to inductionOxygen Delivery Method: Circle system utilized and Simple face mask Preoxygenation: Pre-oxygenation with 100% oxygen Intubation Type: IV induction Ventilation: Mask ventilation without difficulty Laryngoscope Size: Miller and 2 Grade View: Grade I Tube type: Oral Tube size: 7.5 mm Number of attempts: 1 Airway Equipment and Method: Patient positioned with wedge pillow and Stylet Placement Confirmation: ETT inserted through vocal cords under direct vision,  positive ETCO2 and breath sounds checked- equal and bilateral Secured at: 21 cm Tube secured with: Tape

## 2015-05-09 NOTE — Progress Notes (Signed)
PATIENT DETAILS Name: Elizabeth Mcmillan Age: 74 y.o. Sex: female Date of Birth: 1941-02-06 Admit Date: 05/07/2015 Admitting Physician Theressa Millard, MD PCP:No primary care provider on file.  Brief narrative: Elizabeth Mcmillan is a 74 y.o. female with a history of hypertension, type 2 diabetes mellitus, hyperlipidemia who presented to the hospital for lower back pain radiating down her right leg for the past 5 days prior to this admission. She was evaluated at [facility] on 10/15 and was prescribed a prednisone taper along with hydrocodone/acetaminophen and Robaxin however her symptoms did not improve. She continued to have difficulty with ambulation. An MRI performed in the ER revealed L3-L4 disc protrusion with L3 impingement and she was subsequently evaluated by neurosurgery. It was recommended that she be admitted to undergo surgery.  Subjective: Continues to have back pain. Nonfocal exam.  Assessment/Plan: Principal Problem: Lumbar disc prolapse with compression radiculopathy: Seen by neurosurgery-plans for OR today for microdiscectomy  Active Problems: Acute renal failure: Resolved with IV fluids. Follow  Hypokalemia: Resolved, likely secondary to diuretics. Continue to follow.  Diabetes mellitus without complication: CBGs stable with SSI, resume metformin on discharge  Hypertension: Moderately controlled with atenolol, chlorthalidone and Avapro. Suspect-worsened by pain and anxiety.  Hypercholesteremia: Continue Crestor  Disposition: Remain inpatient  Antimicrobial agents  See below  Anti-infectives    None      DVT Prophylaxis: SCD's-start pharmacological prophylaxis postoperatively  Code Status: Full code   Family Communication None at bedside  Procedures: None  CONSULTS:  Neurosurgery  Time spent 20 minutes-Greater than 50% of this time was spent in counseling, explanation of diagnosis, planning of further  management, and coordination of care.  MEDICATIONS: Scheduled Meds: . aspirin EC  81 mg Oral QHS  . atenolol  50 mg Oral Daily  . B-complex with vitamin C  1 tablet Oral Daily  . chlorthalidone  25 mg Oral Daily  . feeding supplement (GLUCERNA SHAKE)  237 mL Oral BID PC  . insulin aspart  0-5 Units Subcutaneous QHS  . insulin aspart  0-9 Units Subcutaneous TID WC  . irbesartan  300 mg Oral Daily  . rosuvastatin  40 mg Oral Daily  . senna  1 tablet Oral BID  . venlafaxine XR  150 mg Oral Daily   Continuous Infusions: . sodium chloride 75 mL/hr at 05/08/15 0022   PRN Meds:.acetaminophen **OR** acetaminophen, alum & mag hydroxide-simeth, HYDROmorphone (DILAUDID) injection, ondansetron **OR** ondansetron (ZOFRAN) IV, oxyCODONE    PHYSICAL EXAM: Vital signs in last 24 hours: Filed Vitals:   05/09/15 0202 05/09/15 0513 05/09/15 0911 05/09/15 0954  BP: 182/58 165/63 176/52   Pulse: 58 64 61 72  Temp: 98.2 F (36.8 C) 98 F (36.7 C) 99.3 F (37.4 C)   TempSrc: Oral Oral Oral   Resp: 18 18 16    Height:      Weight:      SpO2: 100% 100% 98%     Weight change:  Filed Weights   05/07/15 2349  Weight: 58.922 kg (129 lb 14.4 oz)   Body mass index is 25.37 kg/(m^2).   Gen Exam: Awake and alert with clear speech.   Neck: Supple, No JVD.   Chest: B/L Clear.   CVS: S1 S2 Regular, no murmurs.  Abdomen: soft, BS +, non tender, non distended.  Extremities: no edema, lower extremities warm to touch. Neurologic: Non Focal.  Skin: No Rash.  Wounds: N/A.    Intake/Output from previous day: No intake or output data in the 24 hours ending 05/09/15 1405   LAB RESULTS: CBC  Recent Labs Lab 05/07/15 2143 05/08/15 0417  WBC 11.8* 11.7*  HGB 12.9 12.4  HCT 38.7 38.8  PLT 236 240  MCV 91.3 91.9  MCH 30.4 29.4  MCHC 33.3 32.0  RDW 13.2 13.2  LYMPHSABS 2.2  --   MONOABS 1.0  --   EOSABS 0.0  --   BASOSABS 0.0  --     Chemistries   Recent Labs Lab 05/07/15 2143  05/08/15 0417 05/09/15 0455  NA 139 138 141  K 3.2* 3.4* 4.3  CL 99* 99* 103  CO2 29 30 30   GLUCOSE 175* 128* 146*  BUN 31* 32* 19  CREATININE 1.18* 0.96 0.81  CALCIUM 9.3 8.9 9.3    CBG:  Recent Labs Lab 05/08/15 1211 05/08/15 1700 05/08/15 2117 05/09/15 0627 05/09/15 1148  GLUCAP 145* 152* 187* 154* 134*    GFR Estimated Creatinine Clearance: 50.4 mL/min (by C-G formula based on Cr of 0.81).  Coagulation profile No results for input(s): INR, PROTIME in the last 168 hours.  Cardiac Enzymes No results for input(s): CKMB, TROPONINI, MYOGLOBIN in the last 168 hours.  Invalid input(s): CK  Invalid input(s): POCBNP No results for input(s): DDIMER in the last 72 hours.  Recent Labs  05/08/15 0417  HGBA1C 7.0*   No results for input(s): CHOL, HDL, LDLCALC, TRIG, CHOLHDL, LDLDIRECT in the last 72 hours. No results for input(s): TSH, T4TOTAL, T3FREE, THYROIDAB in the last 72 hours.  Invalid input(s): FREET3 No results for input(s): VITAMINB12, FOLATE, FERRITIN, TIBC, IRON, RETICCTPCT in the last 72 hours. No results for input(s): LIPASE, AMYLASE in the last 72 hours.  Urine Studies No results for input(s): UHGB, CRYS in the last 72 hours.  Invalid input(s): UACOL, UAPR, USPG, UPH, UTP, UGL, UKET, UBIL, UNIT, UROB, ULEU, UEPI, UWBC, URBC, UBAC, CAST, UCOM, BILUA  MICROBIOLOGY: Recent Results (from the past 240 hour(s))  Surgical pcr screen     Status: Abnormal   Collection Time: 05/09/15 11:05 AM  Result Value Ref Range Status   MRSA, PCR NEGATIVE NEGATIVE Final   Staphylococcus aureus POSITIVE (A) NEGATIVE Final    Comment:        The Xpert SA Assay (FDA approved for NASAL specimens in patients over 25 years of age), is one component of a comprehensive surveillance program.  Test performance has been validated by [facility] for patients greater than or equal to 25 year old. It is not intended to diagnose infection nor to guide or monitor treatment.      RADIOLOGY STUDIES/RESULTS: Dg Lumbar Spine Complete  05/06/2015  CLINICAL DATA:  Mid back pain EXAM: LUMBAR SPINE - COMPLETE 4+ VIEW COMPARISON:  CT abdomen pelvis dated 07/28/2013 FINDINGS: Five lumbar type vertebral bodies. Normal lumbar lordosis. No evidence of fracture or dislocation. Vertebral body heights are maintained. Mild to moderate multilevel degenerative changes. Visualized bony pelvis appears intact. Vascular calcifications. IMPRESSION: No fracture or dislocation is seen. Electronically Signed   By: Julian Hy M.D.   On: 05/06/2015 11:22   Mr Lumbar Spine Wo Contrast  05/07/2015  CLINICAL DATA:  Spasms down the right leg since Friday (mention of left leg weakness in the chart is an error) EXAM: MRI LUMBAR SPINE WITHOUT CONTRAST TECHNIQUE: Multiplanar, multisequence MR imaging of the lumbar spine was performed. No intravenous contrast was administered. COMPARISON:  None. FINDINGS: No marrow  signal abnormality suggestive of fracture, infection, or neoplasm. Normal conus signal and morphology. No perispinal abnormality to explain back pain. A nodule within the right adrenal gland measuring 12 mm is stable from 2015 abdominal CT and presumably an adenoma given stability. Degenerative changes: L1-L2: Disc narrowing and bulging with small endplate spurs. Negative facets. No herniation or impingement. L2-L3: Greatest level of disc narrowing with bulging and endplate spurs greater to the right. The ventral thecal sac is flattened, greater on the right, without impingement. Small facet joint fluid and mild retrolisthesis. L3-L4: Right foraminal protrusion with mass effect on the right L3 nerve. The canal and left foramen are patent. Facet arthropathy with spurring and small joint fluid. L4-L5: Disc bulging with left foraminal annular fissure. Facet arthropathy with spurring and joint fluid greater on the left. No impingement. L5-S1:Negative disc. Facet arthropathy with spurring greater on  the right. No impingement. IMPRESSION: 1. L3-4 right foraminal disc protrusion with L3 impingement. 2. Noncompressive degenerative disc and facet disease is described above. Electronically Signed   By: Monte Fantasia M.D.   On: 05/07/2015 19:26    Oren Binet, MD  [facility] Pager:[phone]  If 7PM-7AM, please contact night-coverage [email or web address] Password [email or web address] 05/09/2015, 2:05 PM   LOS: 2 days

## 2015-05-09 NOTE — Anesthesia Preprocedure Evaluation (Addendum)
Anesthesia Evaluation  Patient identified by MRN, date of birth, ID band Patient awake    Reviewed: Allergy & Precautions, NPO status , Patient's Chart, lab work & pertinent test results, reviewed documented beta blocker date and time   History of Anesthesia Complications Negative for: history of anesthetic complications  Airway Mallampati: II  TM Distance: >3 FB Neck ROM: Full    Dental  (+) Dental Advisory Given, Upper Dentures   Pulmonary neg pulmonary ROS,    Pulmonary exam normal breath sounds clear to auscultation       Cardiovascular hypertension, Pt. on home beta blockers and Pt. on medications (-) angina(-) Past MI Normal cardiovascular exam Rhythm:Regular Rate:Normal     Neuro/Psych  Neuromuscular disease    GI/Hepatic negative GI ROS, Neg liver ROS,   Endo/Other  diabetes, Type 2, Oral Hypoglycemic Agents  Renal/GU negative Renal ROS     Musculoskeletal negative musculoskeletal ROS (+)   Abdominal   Peds  Hematology negative hematology ROS (+)   Anesthesia Other Findings Day of surgery medications reviewed with the patient.  Reproductive/Obstetrics                            Anesthesia Physical Anesthesia Plan  ASA: II  Anesthesia Plan: General   Post-op Pain Management:    Induction: Intravenous  Airway Management Planned: Oral ETT  Additional Equipment:   Intra-op Plan:   Post-operative Plan: Extubation in OR  Informed Consent: I have reviewed the patients History and Physical, chart, labs and discussed the procedure including the risks, benefits and alternatives for the proposed anesthesia with the patient or authorized representative who has indicated his/her understanding and acceptance.   Dental advisory given  Plan Discussed with: CRNA  Anesthesia Plan Comments: (Risks/benefits of general anesthesia discussed with patient including risk of damage to teeth,  lips, gum, and tongue, nausea/vomiting, allergic reactions to medications, and the possibility of heart attack, stroke and death.  All patient questions answered.  Patient wishes to proceed.)        Anesthesia Quick Evaluation

## 2015-05-09 NOTE — Progress Notes (Signed)
Subjective: Patient reports "I'm ready to have this surgery"   Objective: Vital signs in last 24 hours: Temp:  [97.9 F (36.6 C)-98.9 F (37.2 C)] 98 F (36.7 C) (10/18 0513) Pulse Rate:  [56-68] 64 (10/18 0513) Resp:  [18] 18 (10/18 0513) BP: (165-182)/(54-64) 165/63 mmHg (10/18 0513) SpO2:  [94 %-100 %] 100 % (10/18 0513)  Intake/Output from previous day:   Intake/Output this shift:    Alert, conversant. Reports primarily right thigh and knee pain this morning. aware of plan for far lateral microdiscectomy this afternoon & agreeable to proceed.  Lab Results:  Recent Labs  05/07/15 2143 05/08/15 0417  WBC 11.8* 11.7*  HGB 12.9 12.4  HCT 38.7 38.8  PLT 236 240   BMET  Recent Labs  05/08/15 0417 05/09/15 0455  NA 138 141  K 3.4* 4.3  CL 99* 103  CO2 30 30  GLUCOSE 128* 146*  BUN 32* 19  CREATININE 0.96 0.81  CALCIUM 8.9 9.3    Studies/Results: Mr Lumbar Spine Wo Contrast  05/07/2015  CLINICAL DATA:  Spasms down the right leg since Friday (mention of left leg weakness in the chart is an error) EXAM: MRI LUMBAR SPINE WITHOUT CONTRAST TECHNIQUE: Multiplanar, multisequence MR imaging of the lumbar spine was performed. No intravenous contrast was administered. COMPARISON:  None. FINDINGS: No marrow signal abnormality suggestive of fracture, infection, or neoplasm. Normal conus signal and morphology. No perispinal abnormality to explain back pain. A nodule within the right adrenal gland measuring 12 mm is stable from 2015 abdominal CT and presumably an adenoma given stability. Degenerative changes: L1-L2: Disc narrowing and bulging with small endplate spurs. Negative facets. No herniation or impingement. L2-L3: Greatest level of disc narrowing with bulging and endplate spurs greater to the right. The ventral thecal sac is flattened, greater on the right, without impingement. Small facet joint fluid and mild retrolisthesis. L3-L4: Right foraminal protrusion with mass effect  on the right L3 nerve. The canal and left foramen are patent. Facet arthropathy with spurring and small joint fluid. L4-L5: Disc bulging with left foraminal annular fissure. Facet arthropathy with spurring and joint fluid greater on the left. No impingement. L5-S1:Negative disc. Facet arthropathy with spurring greater on the right. No impingement. IMPRESSION: 1. L3-4 right foraminal disc protrusion with L3 impingement. 2. Noncompressive degenerative disc and facet disease is described above. Electronically Signed   By: Monte Fantasia M.D.   On: 05/07/2015 19:26    Assessment/Plan:   LOS: 2 days  Surgery this afternoon.   Verdis Prime 05/09/2015, 7:22 AM

## 2015-05-09 NOTE — Transfer of Care (Signed)
Immediate Anesthesia Transfer of Care Note  Patient: Elizabeth Mcmillan  Procedure(s) Performed: Procedure(s): Far Lateral Lumbar Three-Four Microdiscectomy WITH MET-RX (Right)  Patient Location: PACU  Anesthesia Type:General  Level of Consciousness: awake, alert  and oriented  Airway & Oxygen Therapy: Patient Spontanous Breathing and Patient connected to nasal cannula oxygen  Post-op Assessment: Report given to RN, Post -op Vital signs reviewed and stable and Patient moving all extremities X 4  Post vital signs: Reviewed and stable  Last Vitals:  Filed Vitals:   05/09/15 1655  BP:   Pulse:   Temp: 36.2 C  Resp:     Complications: No apparent anesthesia complications

## 2015-05-10 ENCOUNTER — Encounter (HOSPITAL_COMMUNITY): Payer: Self-pay | Admitting: Neurosurgery

## 2015-05-10 LAB — GLUCOSE, CAPILLARY
GLUCOSE-CAPILLARY: 150 mg/dL — AB (ref 65–99)
GLUCOSE-CAPILLARY: 213 mg/dL — AB (ref 65–99)
Glucose-Capillary: 212 mg/dL — ABNORMAL HIGH (ref 65–99)

## 2015-05-10 LAB — CBC
HEMATOCRIT: 36.4 % (ref 36.0–46.0)
HEMOGLOBIN: 12.2 g/dL (ref 12.0–15.0)
MCH: 30.7 pg (ref 26.0–34.0)
MCHC: 33.5 g/dL (ref 30.0–36.0)
MCV: 91.7 fL (ref 78.0–100.0)
Platelets: 203 10*3/uL (ref 150–400)
RBC: 3.97 MIL/uL (ref 3.87–5.11)
RDW: 13.2 % (ref 11.5–15.5)
WBC: 9.6 10*3/uL (ref 4.0–10.5)

## 2015-05-10 LAB — BASIC METABOLIC PANEL
Anion gap: 11 (ref 5–15)
BUN: 19 mg/dL (ref 6–20)
CHLORIDE: 102 mmol/L (ref 101–111)
CO2: 29 mmol/L (ref 22–32)
CREATININE: 0.94 mg/dL (ref 0.44–1.00)
Calcium: 9.5 mg/dL (ref 8.9–10.3)
GFR calc Af Amer: >60 mL/min (ref >60)
GFR calc non Af Amer: 59 mL/min — ABNORMAL LOW (ref >60)
GLUCOSE: 200 mg/dL — AB (ref 65–99)
POTASSIUM: 4.7 mmol/L (ref 3.5–5.1)
Sodium: 142 mmol/L (ref 135–145)

## 2015-05-10 MED ORDER — HYDROCODONE-ACETAMINOPHEN 5-325 MG PO TABS: 1.0000 | ORAL_TABLET | Freq: Four times a day (QID) | ORAL | Status: DC | PRN

## 2015-05-10 NOTE — Anesthesia Postprocedure Evaluation (Signed)
  Anesthesia Post-op Note  Patient: Elizabeth Mcmillan  Procedure(s) Performed: Procedure(s) (LRB): Far Lateral Lumbar Three-Four Microdiscectomy WITH MET-RX (Right)  Patient Location: PACU  Anesthesia Type: General  Level of Consciousness: awake and alert   Airway and Oxygen Therapy: Patient Spontanous Breathing  Post-op Pain: mild  Post-op Assessment: Post-op Vital signs reviewed, Patient's Cardiovascular Status Stable, Respiratory Function Stable, Patent Airway and No signs of Nausea or vomiting  Last Vitals:  Filed Vitals:   05/10/15 1058  BP: 147/54  Pulse: 73  Temp: 36.9 C  Resp: 18    Post-op Vital Signs: stable   Complications: No apparent anesthesia complications

## 2015-05-10 NOTE — Care Management Important Message (Signed)
Important Message  Patient Details  Name: Elizabeth Mcmillan MRN: [id number] Date of Birth: May 12, 1941   Medicare Important Message Given:  Yes-second notification given    Delorse Lek 05/10/2015, 11:20 AM

## 2015-05-10 NOTE — Progress Notes (Signed)
Occupational Therapy Evaluation Patient Details Name: Elizabeth Mcmillan MRN: [id number] DOB: 07-Jun-1941 Today's Date: 05/10/2015    History of Present Illness s/p Far Lateral Lumbar Three-Four Microdiscectomy WITH MET-RX (Right) to repair herniated disc with radiculopathy   Clinical Impression   Pt admitted with the above diagnoses and presents with below problem list. Pt will benefit from continued acute OT to address the below listed deficits and maximize independence with BADLs prior to d/c home. PTA pt was independent with ADLs. Pt is currently supervision level for most LB ADLs, min A for tub shower transfers. Pt's daughter will be assisting at d/c. OT to continue to follow acutely.     Follow Up Recommendations  Supervision - Intermittent;Other (comment);No OT follow up (OOB/mobility)    Equipment Recommendations  3 in 1 bedside comode    Recommendations for Other Services PT consult     Precautions / Restrictions Precautions Precautions: Back Precaution Booklet Issued: Yes (comment) Precaution Comments: educated on BAT precautions Required Braces or Orthoses:  (no brace) Restrictions Weight Bearing Restrictions: No      Mobility Bed Mobility               General bed mobility comments: pt EOB with daughter at start of session. Educated on logrolling technique.   Transfers Overall transfer level: Needs assistance Equipment used: None Transfers: Sit to/from Stand Sit to Stand: Supervision         General transfer comment: supervision for safety. educated on technique to avoid bending. Practiced from EOB, recliner, comfort height toilet, and 3n1.     Balance Overall balance assessment: Needs assistance Sitting-balance support: No upper extremity supported;Feet supported Sitting balance-Leahy Scale: Good     Standing balance support: No upper extremity supported;During functional activity Standing balance-Leahy Scale: Good Standing balance comment: no  LOB during functional mobility                            ADL Overall ADL's : Needs assistance/impaired Eating/Feeding: Set up;Sitting   Grooming: Supervision/safety;Standing;Sitting   Upper Body Bathing: Set up;Sitting   Lower Body Bathing: Supervison/ safety;Sit to/from stand   Upper Body Dressing : Set up;Sitting   Lower Body Dressing: Supervision/safety;Sit to/from stand   Toilet Transfer: Supervision/safety;Ambulation;BSC   Toileting- Clothing Manipulation and Hygiene: Minimal assistance;Sit to/from stand Toileting - Clothing Manipulation Details (indicate cue type and reason): educated on AE Tub/ Shower Transfer: Tub transfer;Min guard;Ambulation;3 in 1   Functional mobility during ADLs: Supervision/safety General ADL Comments: Pt at supervision level for functional mobility and transfers. Able to access feet in sitting position to don LB clothing. Educated on compensatory strategies for ADLs with daughter present as well.      Vision     Perception     Praxis      Pertinent Vitals/Pain Pain Assessment: 0-10 Pain Score:  ("it's not bad at all" no numerical value given) Pain Location: back; RLE>LLE  Pain Descriptors / Indicators: Sore Pain Intervention(s): Monitored during session;Repositioned;Limited activity within patient's tolerance     Hand Dominance     Extremity/Trunk Assessment Upper Extremity Assessment Upper Extremity Assessment: Overall WFL for tasks assessed   Lower Extremity Assessment Lower Extremity Assessment: Defer to PT evaluation       Communication Communication Communication: No difficulties   Cognition Arousal/Alertness: Awake/alert Behavior During Therapy: WFL for tasks assessed/performed Overall Cognitive Status: Within Functional Limits for tasks assessed  General Comments       Exercises       Shoulder Instructions      Home Living Family/patient expects to be discharged to::  Private residence Living Arrangements: Alone Available Help at Discharge: Family;Available 24 hours/day Type of Home: House Home Access: Stairs to enter     Home Layout: One level     Bathroom Shower/Tub: Tub/shower unit Shower/tub characteristics: Curtain Biochemist, clinical: Standard     Home Equipment: Environmental consultant - 2 wheels;Cane - single point   Additional Comments: Daughter will be staying with her for a few days.      Prior Functioning/Environment Level of Independence: Independent             OT Diagnosis: Acute pain   OT Problem List: Impaired balance (sitting and/or standing);Decreased knowledge of use of DME or AE;Decreased knowledge of precautions;Pain   OT Treatment/Interventions: Self-care/ADL training;DME and/or AE instruction;Therapeutic activities;Patient/family education;Balance training    OT Goals(Current goals can be found in the care plan section) Acute Rehab OT Goals Patient Stated Goal: not stated OT Goal Formulation: With patient/family Time For Goal Achievement: 05/17/15 Potential to Achieve Goals: Good ADL Goals Pt Will Perform Grooming: with modified independence;standing Pt Will Perform Tub/Shower Transfer: Tub transfer;with modified independence;ambulating;3 in 1 Additional ADL Goal #1: Pt will complete logrolling for bed mobility at mod I  level to prepare for OOB ADLs.   OT Frequency: Min 2X/week   Barriers to D/C:            Co-evaluation              End of Session    Activity Tolerance: Patient tolerated treatment well Patient left: in chair;with call bell/phone within reach;with chair alarm set;with family/visitor present   Time: 1388-7195 OT Time Calculation (min): 20 min Charges:  OT General Charges $OT Visit: 1 Procedure OT Evaluation $Initial OT Evaluation Tier I: 1 Procedure G-Codes:    Hortencia Pilar 05-27-15, 9:34 AM

## 2015-05-10 NOTE — Clinical Social Work Note (Signed)
CSW consult acknowledged:  Patient discharging home today, 10/19 with home health equipment. Clinical Social Worker will sign off for now as social work intervention is no longer needed. Please consult [place] again if new need arises.  Glendon Axe, MSW, LCSWA [phone] 05/10/2015 11:34 AM

## 2015-05-10 NOTE — Progress Notes (Signed)
Pt ambulated standby assist to the bathroom. Gait steady. No noted distress. Will continue to monitor.

## 2015-05-10 NOTE — Progress Notes (Signed)
Subjective: Patient reports Pain free  Objective: Vital signs in last 24 hours: Temp:  [97.2 F (36.2 C)-99.3 F (37.4 C)] 98.2 F (36.8 C) (10/19 0534) Pulse Rate:  [61-88] 74 (10/19 0534) Resp:  [14-19] 16 (10/19 0534) BP: (122-192)/(42-62) 123/46 mmHg (10/19 0534) SpO2:  [93 %-100 %] 93 % (10/19 0534)  Intake/Output from previous day:   Intake/Output this shift:    Physical Exam: Full strength.  No numbness.  Dressing CDI.  Lab Results:  Recent Labs  05/08/15 0417 05/10/15 0747  WBC 11.7* 9.6  HGB 12.4 12.2  HCT 38.8 36.4  PLT 240 203   BMET  Recent Labs  05/09/15 0455 05/10/15 0747  NA 141 142  K 4.3 4.7  CL 103 102  CO2 30 29  GLUCOSE 146* 200*  BUN 19 19  CREATININE 0.81 0.94  CALCIUM 9.3 9.5    Studies/Results: Dg Lumbar Spine 1 View  05/09/2015  CLINICAL DATA:  Far lateral L3-4 microdiscectomy. EXAM: DG C-ARM 61-120 MIN; LUMBAR SPINE - 1 VIEW COMPARISON:  None. FINDINGS: Single provided spot fluoroscopic nondiagnostic intraoperative radiograph of the lower lumbar spine demonstrates surgical device overlying the right L3-4 disc space. IMPRESSION: Intraoperative guidance for far lateral L3-4 microdiscectomy. Electronically Signed   By: Ilona Sorrel M.D.   On: 05/09/2015 20:18   Dg C-arm 1-60 Min  05/09/2015  CLINICAL DATA:  Far lateral L3-4 microdiscectomy. EXAM: DG C-ARM 61-120 MIN; LUMBAR SPINE - 1 VIEW COMPARISON:  None. FINDINGS: Single provided spot fluoroscopic nondiagnostic intraoperative radiograph of the lower lumbar spine demonstrates surgical device overlying the right L3-4 disc space. IMPRESSION: Intraoperative guidance for far lateral L3-4 microdiscectomy. Electronically Signed   By: Ilona Sorrel M.D.   On: 05/09/2015 20:18    Assessment/Plan: Patient doing well.  Discharge home.    LOS: 3 days    Peggyann Shoals, MD 05/10/2015, 8:46 AM

## 2015-05-10 NOTE — Evaluation (Signed)
Physical Therapy Evaluation Patient Details Name: Elizabeth Mcmillan MRN: [id number] DOB: 1940/11/25 Today's Date: 05/10/2015   History of Present Illness  s/p  Lateral Lumbar Three-Four Microdiscectomy WITH MET-RX (Right) to repair herniated disc with radiculopathy  Clinical Impression  Patient is s/p above surgery resulting in the deficits listed below (see PT Problem List). Pt with noted R LE buckling on stairs and ambulation requiring assist on stairs and RW for safe ambulation. Instructed pt to ask MD at follow up for PT consult if appropriate to address R LE weakness.  Patient will benefit from skilled PT to increase their independence and safety with mobility (while adhering to their precautions) to allow discharge to the venue listed below.     Follow Up Recommendations Supervision/Assistance - 24 hour (PT when approved by MD)    Equipment Recommendations  Rolling walker with 5" wheels;3in1 (PT)    Recommendations for Other Services       Precautions / Restrictions Precautions Precautions: Back Precaution Booklet Issued: Yes (comment) Precaution Comments: educated on BAT precautions Required Braces or Orthoses:  (no brace) Restrictions Weight Bearing Restrictions: No      Mobility  Bed Mobility Overal bed mobility: Needs Assistance Bed Mobility: Sit to Sidelying         Sit to sidelying: Min guard General bed mobility comments: v/c's for technique but no physical assist required  Transfers Overall transfer level: Needs assistance Equipment used: None Transfers: Sit to/from Stand Sit to Stand: Supervision         General transfer comment: v/c's to push up from chair  Ambulation/Gait Ambulation/Gait assistance: Min guard Ambulation Distance (Feet): 100 Feet (x2) Assistive device: Rolling walker (2 wheeled);None Gait Pattern/deviations: Step-through pattern;Decreased stride length Gait velocity: decreased Gait velocity interpretation: Below normal speed  for age/gender General Gait Details: amb without AD first trial and pt with R knee instability. Pt give RW and pt with improved step length. pt con't to demo R Knee instability with onset of fatigue however able to maintain balance due to RW where as pt required minA to prevent fall without AD  Stairs Stairs: Yes Stairs assistance: Min assist Stair Management: One rail Left;Alternating pattern Number of Stairs: 3 General stair comments: pt attempted to ascend with R LE the 2nd step but knee gave out requiring minA to prevent fall. Pt denies hurting back during episode and pts bottom and knees did not come in contact with floor. Educated pt on step to gait pattern leading with her L LE (stronger leg) going up and R LE (weaker leg) going down  Wheelchair Mobility    Modified Rankin (Stroke Patients Only)       Balance Overall balance assessment: Needs assistance   Sitting balance-Leahy Scale: Good       Standing balance-Leahy Scale: Good Standing balance comment: pt with R knee instability with onset of fatigue                             Pertinent Vitals/Pain Pain Assessment: 0-10 Pain Score: 2  Pain Location: back/R LE Pain Descriptors / Indicators: Sore Pain Intervention(s): Monitored during session    Home Living Family/patient expects to be discharged to:: Private residence Living Arrangements: Alone Available Help at Discharge: Family;Available 24 hours/day Type of Home: House Home Access: Stairs to enter     Home Layout: One level Home Equipment: Environmental consultant - 2 wheels;Cane - single point Additional Comments: Daughter will be staying with her for  a few days.    Prior Function Level of Independence: Independent               Hand Dominance        Extremity/Trunk Assessment   Upper Extremity Assessment: Overall WFL for tasks assessed           Lower Extremity Assessment: RLE deficits/detail RLE Deficits / Details: grossly 4/5 however  easily gives way upon fatigue    Cervical / Trunk Assessment: Normal  Communication   Communication: No difficulties  Cognition Arousal/Alertness: Awake/alert Behavior During Therapy: WFL for tasks assessed/performed Overall Cognitive Status: Within Functional Limits for tasks assessed                      General Comments      Exercises        Assessment/Plan    PT Assessment Patient needs continued PT services  PT Diagnosis Difficulty walking   PT Problem List Decreased strength;Decreased range of motion;Decreased activity tolerance;Decreased balance;Decreased mobility  PT Treatment Interventions DME instruction;Gait training;Stair training;Therapeutic activities;Functional mobility training;Therapeutic exercise;Balance training   PT Goals (Current goals can be found in the Care Plan section) Acute Rehab PT Goals Patient Stated Goal: home PT Goal Formulation: With patient Time For Goal Achievement: 05/17/15 Potential to Achieve Goals: Good    Frequency Min 5X/week   Barriers to discharge        Co-evaluation               End of Session Equipment Utilized During Treatment: Gait belt Activity Tolerance: Patient tolerated treatment well Patient left: in bed;with family/visitor present;with call bell/phone within reach Nurse Communication: Mobility status (R knee weakness)         Time: 1011-1040 PT Time Calculation (min) (ACUTE ONLY): 29 min   Charges:   PT Evaluation $Initial PT Evaluation Tier I: 1 Procedure PT Treatments $Gait Training: 8-22 mins   PT G CodesKingsley Callander 05/10/2015, 2:36 PM   Kittie Plater, PT, DPT Pager #: [phone] Office #: [phone]

## 2015-05-10 NOTE — Progress Notes (Signed)
Pt discharging at this time with family taking all personal belongings. IV's discontinued, dry dressing applied. Discharge instructions and prescription provided with verbal understanding. No noted distress. Pt aware of follow up appt to be scheduled per Md.

## 2015-05-10 NOTE — Discharge Summary (Signed)
Physician Discharge Summary  Patient ID: Elizabeth Mcmillan MRN: [id number] DOB/AGE: 26-Dec-1940 74 y.o.  Admit date: 05/07/2015 Discharge date: 05/10/2015  Admission Diagnoses:Herniated lumbar disc L 34 right, far lateral with severe right L3 radiculopathy  Discharge Diagnoses: Herniated lumbar disc L 34 right, far lateral with severe right L3 radiculopathy  Principal Problem:   Lumbar disc prolapse with compression radiculopathy Active Problems:   Diabetes mellitus without complication (HCC)   Hypertension   Hypercholesteremia   Herniated lumbar intervertebral disc   Discharged Condition: good  Hospital Course: Patient was admitted from ER unable to ambulate and with severe pain.  She was taken to surgery on 05/09/15 and large far lateral disc herniation was removed via Metrx tubular retractor.  Patient had complete relief of intractable pain and was mobilized and discharged home the next morning.  Consults: None  Significant Diagnostic Studies: None  Treatments: surgery:  large far lateral disc herniation was removed via Metrx tubular retractor L 34 Right  Discharge Exam: Blood pressure 123/46, pulse 74, temperature 98.2 F (36.8 C), temperature source Oral, resp. rate 16, height 5' (1.524 m), weight 58.922 kg (129 lb 14.4 oz), SpO2 93 %. Neurologic: Alert and oriented X 3, normal strength and tone. Normal symmetric reflexes. Normal coordination and gait Wound:CDI  Disposition: Home     Medication List    TAKE these medications        acetaminophen 500 MG tablet  Commonly known as:  TYLENOL  Take 1,000 mg by mouth every 6 (six) hours as needed (pain).     ALEVE 220 MG tablet  Generic drug:  naproxen sodium  Take 440 mg by mouth daily as needed (pain).     aspirin EC 81 MG tablet  Take 81 mg by mouth at bedtime.     atenolol-chlorthalidone 50-25 MG tablet  Commonly known as:  TENORETIC  Take 1 tablet by mouth daily.     BIOFREEZE EX  Apply 1 application  topically 2 (two) times daily as needed (pain).     CALCIUM PO  Take 1 tablet by mouth daily.     diphenhydramine-acetaminophen 25-500 MG Tabs tablet  Commonly known as:  TYLENOL PM  Take 1 tablet by mouth at bedtime.     HYDROcodone-acetaminophen 5-325 MG tablet  Commonly known as:  NORCO/VICODIN  Take 1 tablet by mouth every 6 (six) hours as needed (pain).     HYDROcodone-acetaminophen 5-325 MG tablet  Commonly known as:  NORCO/VICODIN  Take 1-2 tablets by mouth every 6 (six) hours as needed (pain).     irbesartan 300 MG tablet  Commonly known as:  AVAPRO  Take 300 mg by mouth daily.     meloxicam 7.5 MG tablet  Commonly known as:  MOBIC  Take 1 tablet (7.5 mg total) by mouth daily.     metFORMIN 750 MG 24 hr tablet  Commonly known as:  GLUCOPHAGE-XR  Take 750 mg by mouth 2 (two) times daily. Morning and bedtime     methocarbamol 750 MG tablet  Commonly known as:  ROBAXIN  Take 750 mg by mouth 4 (four) times daily as needed for muscle spasms.     predniSONE 20 MG tablet  Commonly known as:  DELTASONE  Take 40 mg by mouth daily. 5 day course started 05/05/15     rosuvastatin 40 MG tablet  Commonly known as:  CRESTOR  Take 40 mg by mouth daily.     SUPER B COMPLEX/VITAMIN C PO  Take 1 tablet by  mouth daily.     venlafaxine XR 150 MG 24 hr capsule  Commonly known as:  EFFEXOR-XR  Take 150 mg by mouth daily.         Signed: Peggyann Shoals, MD 05/10/2015, 8:48 AM

## 2015-05-10 NOTE — Care Management Note (Addendum)
Case Management Note  Patient Details  Name: Elizabeth Mcmillan MRN: [id number] Date of Birth: Jan 12, 1941  Subjective/Objective:                    Action/Plan: Patient being discharged home today. PT/OT recommending a 3 in 1 and a walker. CM spoke with the patient and she is interested in having this equipment for home. Order placed and CM contacted Jermaine with [facility] and he is going to bring up the 3 in 1 and walker. CM reminded patient not to leave without her equipment. No PCP entered into the system for the patient. Patient states that she has PCP named [name]. Updated bedside RN.   Expected Discharge Date:                  Expected Discharge Plan:  Home/Self Care  In-House Referral:     Discharge planning Services     Post Acute Care Choice:    Choice offered to:  Patient  DME Arranged:  3-N-1 DME Agency:  [place]:    [place] Agency:     Status of Service:  Completed, signed off  Medicare Important Message Given:    Date Medicare IM Given:    Medicare IM give by:    Date Additional Medicare IM Given:    Additional Medicare Important Message give by:     If discussed at [place] of Stay Meetings, dates discussed:    Additional Comments:  Pollie Friar, RN 05/10/2015, 10:03 AM

## 2015-11-02 ENCOUNTER — Other Ambulatory Visit: Payer: Self-pay | Admitting: Nurse Practitioner

## 2015-11-02 DIAGNOSIS — I6521 Occlusion and stenosis of right carotid artery: Secondary | ICD-10-CM

## 2015-11-03 ENCOUNTER — Ambulatory Visit
Admission: RE | Admit: 2015-11-03 | Discharge: 2015-11-03 | Disposition: A | Discharge status: Home / Self Care | Payer: Medicare Other | Source: Ambulatory Visit | Attending: Nurse Practitioner | Admitting: Nurse Practitioner

## 2015-11-03 DIAGNOSIS — I6521 Occlusion and stenosis of right carotid artery: Secondary | ICD-10-CM | POA: Diagnosis not present

## 2016-02-20 ENCOUNTER — Other Ambulatory Visit: Payer: Self-pay | Admitting: Nurse Practitioner

## 2016-02-20 DIAGNOSIS — Z1231 Encounter for screening mammogram for malignant neoplasm of breast: Secondary | ICD-10-CM

## 2016-03-05 ENCOUNTER — Ambulatory Visit
Admission: RE | Admit: 2016-03-05 | Discharge: 2016-03-05 | Disposition: A | Discharge status: Home / Self Care | Payer: Medicare Other | Source: Ambulatory Visit | Attending: Nurse Practitioner | Admitting: Nurse Practitioner

## 2016-03-05 ENCOUNTER — Other Ambulatory Visit: Payer: Self-pay | Admitting: Nurse Practitioner

## 2016-03-05 DIAGNOSIS — Z1231 Encounter for screening mammogram for malignant neoplasm of breast: Secondary | ICD-10-CM | POA: Insufficient documentation

## 2016-09-03 ENCOUNTER — Encounter: Payer: Self-pay | Admitting: *Deleted

## 2016-09-04 ENCOUNTER — Ambulatory Visit: Payer: Medicare Other | Admitting: Anesthesiology

## 2016-09-04 ENCOUNTER — Encounter: Payer: Self-pay | Admitting: *Deleted

## 2016-09-04 ENCOUNTER — Encounter
Admission: RE | Disposition: A | Discharge status: Home / Self Care | Payer: Self-pay | Source: Ambulatory Visit | Attending: Unknown Physician Specialty

## 2016-09-04 ENCOUNTER — Ambulatory Visit
Admission: RE | Admit: 2016-09-04 | Discharge: 2016-09-04 | Disposition: A | Discharge status: Home / Self Care | Payer: Medicare Other | Source: Ambulatory Visit | Attending: Unknown Physician Specialty | Admitting: Unknown Physician Specialty

## 2016-09-04 DIAGNOSIS — E119 Type 2 diabetes mellitus without complications: Secondary | ICD-10-CM | POA: Diagnosis not present

## 2016-09-04 DIAGNOSIS — Z7982 Long term (current) use of aspirin: Secondary | ICD-10-CM | POA: Diagnosis not present

## 2016-09-04 DIAGNOSIS — K648 Other hemorrhoids: Secondary | ICD-10-CM | POA: Diagnosis not present

## 2016-09-04 DIAGNOSIS — F419 Anxiety disorder, unspecified: Secondary | ICD-10-CM | POA: Diagnosis not present

## 2016-09-04 DIAGNOSIS — Z86010 Personal history of colonic polyps: Secondary | ICD-10-CM | POA: Insufficient documentation

## 2016-09-04 DIAGNOSIS — Z1211 Encounter for screening for malignant neoplasm of colon: Secondary | ICD-10-CM | POA: Diagnosis present

## 2016-09-04 DIAGNOSIS — D123 Benign neoplasm of transverse colon: Secondary | ICD-10-CM | POA: Insufficient documentation

## 2016-09-04 DIAGNOSIS — I10 Essential (primary) hypertension: Secondary | ICD-10-CM | POA: Insufficient documentation

## 2016-09-04 DIAGNOSIS — K5730 Diverticulosis of large intestine without perforation or abscess without bleeding: Secondary | ICD-10-CM | POA: Insufficient documentation

## 2016-09-04 DIAGNOSIS — E7800 Pure hypercholesterolemia, unspecified: Secondary | ICD-10-CM | POA: Diagnosis not present

## 2016-09-04 DIAGNOSIS — K219 Gastro-esophageal reflux disease without esophagitis: Secondary | ICD-10-CM | POA: Diagnosis not present

## 2016-09-04 DIAGNOSIS — Z7984 Long term (current) use of oral hypoglycemic drugs: Secondary | ICD-10-CM | POA: Insufficient documentation

## 2016-09-04 DIAGNOSIS — D122 Benign neoplasm of ascending colon: Secondary | ICD-10-CM | POA: Diagnosis not present

## 2016-09-04 DIAGNOSIS — D124 Benign neoplasm of descending colon: Secondary | ICD-10-CM | POA: Diagnosis not present

## 2016-09-04 HISTORY — DX: Gastro-esophageal reflux disease without esophagitis: K21.9

## 2016-09-04 HISTORY — PX: COLONOSCOPY WITH PROPOFOL: SHX5780

## 2016-09-04 HISTORY — DX: Anxiety disorder, unspecified: F41.9

## 2016-09-04 LAB — GLUCOSE, CAPILLARY: Glucose-Capillary: 152 mg/dL — ABNORMAL HIGH (ref 65–99)

## 2016-09-04 SURGERY — COLONOSCOPY WITH PROPOFOL
Anesthesia: General

## 2016-09-04 MED ORDER — PHENYLEPHRINE HCL 10 MG/ML IJ SOLN
INTRAMUSCULAR | Status: DC | PRN
  Administered 2016-09-04: 100 ug via INTRAVENOUS

## 2016-09-04 MED ORDER — PROPOFOL 500 MG/50ML IV EMUL
INTRAVENOUS | Status: AC
  Filled 2016-09-04: qty 50

## 2016-09-04 MED ORDER — GLYCOPYRROLATE 0.2 MG/ML IV SOSY
PREFILLED_SYRINGE | INTRAVENOUS | Status: DC | PRN
  Administered 2016-09-04: .2 mg via INTRAVENOUS

## 2016-09-04 MED ORDER — PROPOFOL 500 MG/50ML IV EMUL
INTRAVENOUS | Status: DC | PRN
  Administered 2016-09-04: 140 ug/kg/min via INTRAVENOUS

## 2016-09-04 MED ORDER — PHENYLEPHRINE HCL 10 MG/ML IJ SOLN
INTRAMUSCULAR | Status: AC
  Filled 2016-09-04: qty 1

## 2016-09-04 MED ORDER — PROPOFOL 10 MG/ML IV BOLUS
INTRAVENOUS | Status: DC | PRN
  Administered 2016-09-04: 70 mg via INTRAVENOUS

## 2016-09-04 MED ORDER — SODIUM CHLORIDE 0.9 % IV SOLN
INTRAVENOUS | Status: DC
  Administered 2016-09-04: 1000 mL via INTRAVENOUS

## 2016-09-04 NOTE — H&P (Signed)
Primary Care Physician:  PROVIDER NOT [facility] Primary Gastroenterologist:  Dr. Vira Agar  Pre-Procedure History & Physical: HPI:  Elizabeth Mcmillan is a 76 y.o. female is here for an colonoscopy.   Past Medical History:  Diagnosis Date  . Anxiety   . Diabetes mellitus without complication ([place])   . GERD (gastroesophageal reflux disease)   . Hypercholesteremia   . Hypertension     Past Surgical History:  Procedure Laterality Date  . ABDOMINAL HYSTERECTOMY    . BREAST EXCISIONAL BIOPSY Left years ago   negative  . COLON SURGERY    . LUMBAR LAMINECTOMY/ DECOMPRESSION WITH MET-RX Right 05/09/2015   Procedure: Far Lateral Lumbar Three-Four Microdiscectomy WITH MET-RX;  Surgeon: Erline Levine, MD;  Location: [place] [facility];  Service: Neurosurgery;  Laterality: Right;    Prior to Admission medications   Medication Sig Start Date End Date Taking? Authorizing Provider  atenolol-chlorthalidone (TENORETIC) 50-25 MG tablet Take 1 tablet by mouth daily.   Yes Historical Provider, MD  dapagliflozin propanediol (FARXIGA) 5 MG TABS tablet Take 5 mg by mouth daily.   Yes Historical Provider, MD  rosuvastatin (CRESTOR) 40 MG tablet Take 40 mg by mouth daily.   Yes Historical Provider, MD  venlafaxine XR (EFFEXOR-XR) 150 MG 24 hr capsule Take 150 mg by mouth daily.   Yes Historical Provider, MD  acetaminophen (TYLENOL) 500 MG tablet Take 1,000 mg by mouth every 6 (six) hours as needed (pain).    Historical Provider, MD  aspirin EC 81 MG tablet Take 81 mg by mouth at bedtime.    Historical Provider, MD  B Complex-C (SUPER B COMPLEX/VITAMIN C PO) Take 1 tablet by mouth daily.    Historical Provider, MD  CALCIUM PO Take 1 tablet by mouth daily.    Historical Provider, MD  diphenhydramine-acetaminophen (TYLENOL PM) 25-500 MG TABS tablet Take 1 tablet by mouth at bedtime.    Historical Provider, MD  meloxicam (MOBIC) 7.5 MG tablet Take 1 tablet (7.5 mg total) by mouth daily. 05/06/15   Sable Feil,  PA-C  Menthol, Topical Analgesic, (BIOFREEZE EX) Apply 1 application topically 2 (two) times daily as needed (pain).    Historical Provider, MD  metFORMIN (GLUCOPHAGE-XR) 750 MG 24 hr tablet Take 750 mg by mouth 2 (two) times daily. Morning and bedtime    Historical Provider, MD  naproxen sodium (ALEVE) 220 MG tablet Take 440 mg by mouth daily as needed (pain).    Historical Provider, MD    Allergies as of 08/15/2016  . (No Known Allergies)    Family History  Problem Relation Age of Onset  . Breast cancer Neg Hx     Social History   Social History  . Marital status: Widowed    Spouse name: N/A  . Number of children: N/A  . Years of education: N/A   Occupational History  . Not on file.   Social History Main Topics  . Smoking status: Never Smoker  . Smokeless tobacco: Never Used  . Alcohol use No  . Drug use: No  . Sexual activity: Not on file   Other Topics Concern  . Not on file   Social History Narrative  . No narrative on file    Review of Systems: See HPI, otherwise negative ROS  Physical Exam: BP (!) 160/59   Pulse 66   Temp (!) 96.9 F (36.1 C) (Tympanic)   Resp 20   Ht 5' (1.524 m)   Wt 57.2 kg (126 lb)  SpO2 100%   BMI 24.61 kg/m  General:   Alert,  pleasant and cooperative in NAD Head:  Normocephalic and atraumatic. Neck:  Supple; no masses or thyromegaly. Lungs:  Clear throughout to auscultation.    Heart:  Regular rate and rhythm. Abdomen:  Soft, nontender and nondistended. Normal bowel sounds, without guarding, and without rebound.   Neurologic:  Alert and  oriented x4;  grossly normal neurologically.  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  Risks, benefits, limitations, and alternatives regarding  colonoscopy have been reviewed with the patient.  Questions have been answered.  All parties agreeable.   Gaylyn Cheers, MD  09/04/2016, 9:22  AM

## 2016-09-04 NOTE — Anesthesia Postprocedure Evaluation (Signed)
Anesthesia Post Note  Patient: Elizabeth Mcmillan  Procedure(s) Performed: Procedure(s) (LRB): COLONOSCOPY WITH PROPOFOL (N/A)  Patient location during evaluation: Endoscopy Anesthesia Type: General Level of consciousness: awake and alert Pain management: pain level controlled Vital Signs Assessment: post-procedure vital signs reviewed and stable Respiratory status: spontaneous breathing, nonlabored ventilation, respiratory function stable and patient connected to nasal cannula oxygen Cardiovascular status: blood pressure returned to baseline and stable Postop Assessment: no signs of nausea or vomiting Anesthetic complications: no     Last Vitals:  Vitals:   09/04/16 1003 09/04/16 1013  BP: (!) 130/91 (!) 141/58  Pulse: 65 63  Resp: 18 12  Temp:      Last Pain:  Vitals:   09/04/16 0953  TempSrc: Tympanic                 Precious Haws Alphia Behanna

## 2016-09-04 NOTE — Transfer of Care (Signed)
Immediate Anesthesia Transfer of Care Note  Patient: Elizabeth Mcmillan  Procedure(s) Performed: Procedure(s): COLONOSCOPY WITH PROPOFOL (N/A)  Patient Location: PACU  Anesthesia Type:General  Level of Consciousness: awake  Airway & Oxygen Therapy: Patient Spontanous Breathing and Patient connected to nasal cannula oxygen  Post-op Assessment: Report given to RN and Post -op Vital signs reviewed and stable  Post vital signs: Reviewed and stable  Last Vitals:  Vitals:   09/04/16 0854  BP: (!) 160/59  Pulse: 66  Resp: 20  Temp: (!) 36.1 C    Last Pain:  Vitals:   09/04/16 0854  TempSrc: Tympanic         Complications: No apparent anesthesia complications

## 2016-09-04 NOTE — Anesthesia Preprocedure Evaluation (Signed)
Anesthesia Evaluation  Patient identified by MRN, date of birth, ID band Patient awake    Reviewed: Allergy & Precautions, H&P , NPO status , Patient's Chart, lab work & pertinent test results  History of Anesthesia Complications Negative for: history of anesthetic complications  Airway Mallampati: III  TM Distance: <3 FB Neck ROM: limited    Dental  (+) Poor Dentition, Chipped, Partial Upper, Caps   Pulmonary neg pulmonary ROS, neg shortness of breath,    Pulmonary exam normal breath sounds clear to auscultation       Cardiovascular Exercise Tolerance: Good hypertension, (-) angina(-) Past MI and (-) DOE Normal cardiovascular exam Rhythm:regular Rate:Normal     Neuro/Psych PSYCHIATRIC DISORDERS Anxiety  Neuromuscular disease    GI/Hepatic Neg liver ROS, GERD  Controlled and Medicated,  Endo/Other  diabetes, Type 2  Renal/GU negative Renal ROS  negative genitourinary   Musculoskeletal   Abdominal   Peds  Hematology negative hematology ROS (+)   Anesthesia Other Findings Past Medical History: No date: Anxiety No date: Diabetes mellitus without complication (H[facility]) No date: GERD (gastroesophageal reflux disease) No date: Hypercholesteremia No date: Hypertension  Past Surgical History: No date: ABDOMINAL HYSTERECTOMY years ago: BREAST EXCISIONAL BIOPSY Left     Comment: negative No date: COLON SURGERY 05/09/2015: LUMBAR LAMINECTOMY/ DECOMPRESSION WITH MET-RX Right     Comment: Procedure: Far Lateral Lumbar Three-Four               Microdiscectomy WITH MET-RX;  Surgeon: Erline Levine, MD;  Location: [place] [facility]UR[facility] ORS;  Service:               Neurosurgery;  Laterality: Right;  BMI    Body Mass Index:  24.61 kg/m      Reproductive/Obstetrics negative OB ROS                             Anesthesia Physical Anesthesia Plan  ASA: III  Anesthesia Plan: General   Post-op  Pain Management:    Induction:   Airway Management Planned:   Additional Equipment:   Intra-op Plan:   Post-operative Plan:   Informed Consent: I have reviewed the patients History and Physical, chart, labs and discussed the procedure including the risks, benefits and alternatives for the proposed anesthesia with the patient or authorized representative who has indicated his/her understanding and acceptance.   Dental Advisory Given  Plan Discussed with: Anesthesiologist, CRNA and Surgeon  Anesthesia Plan Comments:         Anesthesia Quick Evaluation

## 2016-09-04 NOTE — Anesthesia Post-op Follow-up Note (Signed)
Anesthesia QCDR form completed.        

## 2016-09-04 NOTE — Op Note (Signed)
[facility] Gastroenterology Patient Name: Elizabeth Mcmillan Procedure Date: 09/04/2016 9:11 AM MRN: [id number] Account #: [id number] Date of Birth: [birth year] Admit Type: Outpatient Age: 76 Room: [facility] ENDO ROOM 4 Gender: Female Note Status: Finalized Procedure:            Colonoscopy Indications:          High risk colon cancer surveillance: Personal history                        of colonic polyps Providers:            Manya Silvas, MD Referring MD:         Renee Rival (Referring MD) Medicines:            Propofol per Anesthesia Complications:        No immediate complications. Procedure:            Pre-Anesthesia Assessment:                       - After reviewing the risks and benefits, the patient                        was deemed in satisfactory condition to undergo the                        procedure.                       After obtaining informed consent, the colonoscope was                        passed under direct vision. Throughout the procedure,                        the patient's blood pressure, pulse, and oxygen                        saturations were monitored continuously. The                        Colonoscope was introduced through the anus and                        advanced to the the cecum, identified by appendiceal                        orifice and ileocecal valve. The colonoscopy was                        performed without difficulty. The patient tolerated the                        procedure well. The quality of the bowel preparation                        was excellent. Findings:      A diminutive polyp was found in the descending colon. The polyp was       sessile. The polyp was removed with a jumbo cold forceps. Resection and       retrieval were complete.      A small polyp was found  in the ascending colon. The polyp was sessile.       The polyp was removed with a hot snare. Resection and retrieval were       complete.  To prevent bleeding after the polypectomy, one hemostatic clip       was successfully placed. There was no bleeding during, or at the end, of       the procedure.      A few small-mouthed diverticula were found in the sigmoid colon.      Internal hemorrhoids were found during endoscopy. The hemorrhoids were       small and Grade I (internal hemorrhoids that do not prolapse).      The exam was otherwise without abnormality. Impression:           - One diminutive polyp in the descending colon, removed                        with a jumbo cold forceps. Resected and retrieved.                       - One small polyp in the ascending colon, removed with                        a hot snare. Resected and retrieved. Clip was placed.                       - Diverticulosis in the sigmoid colon.                       - Internal hemorrhoids.                       - The examination was otherwise normal. Recommendation:       - Await pathology results. Manya Silvas, MD 09/04/2016 9:55:05 AM This report has been signed electronically. Number of Addenda: 0 Note Initiated On: 09/04/2016 9:11 AM Scope Withdrawal Time: 0 hours 14 minutes 13 seconds  Total Procedure Duration: 0 hours 20 minutes 37 seconds       [facility]

## 2016-09-05 ENCOUNTER — Encounter: Payer: Self-pay | Admitting: Unknown Physician Specialty

## 2016-09-05 LAB — SURGICAL PATHOLOGY

## 2017-03-04 ENCOUNTER — Other Ambulatory Visit: Payer: Self-pay | Admitting: Nurse Practitioner

## 2017-03-04 DIAGNOSIS — Z1231 Encounter for screening mammogram for malignant neoplasm of breast: Secondary | ICD-10-CM

## 2017-04-21 ENCOUNTER — Emergency Department (HOSPITAL_COMMUNITY)
Admission: EM | Admit: 2017-04-21 | Discharge: 2017-04-22 | Disposition: A | Discharge status: Home / Self Care | Payer: Medicare Other | Attending: Emergency Medicine | Admitting: Emergency Medicine

## 2017-04-21 ENCOUNTER — Encounter (HOSPITAL_COMMUNITY): Payer: Self-pay

## 2017-04-21 DIAGNOSIS — Z7982 Long term (current) use of aspirin: Secondary | ICD-10-CM | POA: Insufficient documentation

## 2017-04-21 DIAGNOSIS — I10 Essential (primary) hypertension: Secondary | ICD-10-CM | POA: Diagnosis not present

## 2017-04-21 DIAGNOSIS — E119 Type 2 diabetes mellitus without complications: Secondary | ICD-10-CM | POA: Diagnosis not present

## 2017-04-21 DIAGNOSIS — R42 Dizziness and giddiness: Secondary | ICD-10-CM | POA: Diagnosis not present

## 2017-04-21 DIAGNOSIS — R109 Unspecified abdominal pain: Secondary | ICD-10-CM

## 2017-04-21 DIAGNOSIS — R1031 Right lower quadrant pain: Secondary | ICD-10-CM | POA: Insufficient documentation

## 2017-04-21 DIAGNOSIS — Z79899 Other long term (current) drug therapy: Secondary | ICD-10-CM | POA: Insufficient documentation

## 2017-04-21 DIAGNOSIS — Z7984 Long term (current) use of oral hypoglycemic drugs: Secondary | ICD-10-CM | POA: Insufficient documentation

## 2017-04-21 DIAGNOSIS — Z791 Long term (current) use of non-steroidal anti-inflammatories (NSAID): Secondary | ICD-10-CM | POA: Diagnosis not present

## 2017-04-21 DIAGNOSIS — N179 Acute kidney failure, unspecified: Secondary | ICD-10-CM | POA: Diagnosis not present

## 2017-04-21 LAB — CBC
HCT: 36.3 % (ref 36.0–46.0)
HEMOGLOBIN: 12.1 g/dL (ref 12.0–15.0)
MCH: 31 pg (ref 26.0–34.0)
MCHC: 33.3 g/dL (ref 30.0–36.0)
MCV: 93.1 fL (ref 78.0–100.0)
PLATELETS: 212 10*3/uL (ref 150–400)
RBC: 3.9 MIL/uL (ref 3.87–5.11)
RDW: 12.5 % (ref 11.5–15.5)
WBC: 8 10*3/uL (ref 4.0–10.5)

## 2017-04-21 LAB — HEPATIC FUNCTION PANEL
ALBUMIN: 4 g/dL (ref 3.5–5.0)
ALK PHOS: 66 U/L (ref 38–126)
ALT: 16 U/L (ref 14–54)
AST: 20 U/L (ref 15–41)
BILIRUBIN TOTAL: 0.5 mg/dL (ref 0.3–1.2)
Total Protein: 6.4 g/dL — ABNORMAL LOW (ref 6.5–8.1)

## 2017-04-21 LAB — URINALYSIS, ROUTINE W REFLEX MICROSCOPIC
Bilirubin Urine: NEGATIVE
HGB URINE DIPSTICK: NEGATIVE
Ketones, ur: NEGATIVE mg/dL
NITRITE: NEGATIVE
PH: 6 (ref 5.0–8.0)
Protein, ur: 30 mg/dL — AB
Specific Gravity, Urine: 1.01 (ref 1.005–1.030)

## 2017-04-21 LAB — BASIC METABOLIC PANEL
ANION GAP: 11 (ref 5–15)
BUN: 33 mg/dL — ABNORMAL HIGH (ref 6–20)
CALCIUM: 9.7 mg/dL (ref 8.9–10.3)
CO2: 27 mmol/L (ref 22–32)
CREATININE: 1.5 mg/dL — AB (ref 0.44–1.00)
Chloride: 100 mmol/L — ABNORMAL LOW (ref 101–111)
GFR, EST AFRICAN AMERICAN: 38 mL/min — AB (ref >60)
GFR, EST NON AFRICAN AMERICAN: 33 mL/min — AB (ref >60)
Glucose, Bld: 102 mg/dL — ABNORMAL HIGH (ref 65–99)
Potassium: 3.4 mmol/L — ABNORMAL LOW (ref 3.5–5.1)
SODIUM: 138 mmol/L (ref 135–145)

## 2017-04-21 LAB — LIPASE, BLOOD: Lipase: 47 U/L (ref 11–51)

## 2017-04-21 MED ORDER — ONDANSETRON HCL 4 MG/2ML IJ SOLN
4.0000 mg | Freq: Once | INTRAMUSCULAR | Status: AC
  Administered 2017-04-21: 4 mg via INTRAVENOUS
  Filled 2017-04-21: qty 2

## 2017-04-21 MED ORDER — SODIUM CHLORIDE 0.9 % IV BOLUS (SEPSIS)
1000.0000 mL | Freq: Once | INTRAVENOUS | Status: AC
  Administered 2017-04-21: 1000 mL via INTRAVENOUS

## 2017-04-21 NOTE — ED Triage Notes (Signed)
Pt states that she started to have L flank pain that started last night. States she has had recurrent UTI's over the past several months, mild fevers today, denies hematuria. Pt also states that she has been feeling dizzy on and off for the past three weeks like she will pass out.

## 2017-04-21 NOTE — ED Notes (Signed)
Called lab to add on lab work 

## 2017-04-22 ENCOUNTER — Emergency Department (HOSPITAL_COMMUNITY): Payer: Medicare Other

## 2017-04-22 DIAGNOSIS — R1031 Right lower quadrant pain: Secondary | ICD-10-CM | POA: Diagnosis not present

## 2017-04-22 MED ORDER — HYDROCODONE-ACETAMINOPHEN 5-325 MG PO TABS: 1.0000 | ORAL_TABLET | Freq: Four times a day (QID) | ORAL | 0 refills | Status: DC | PRN

## 2017-04-22 MED ORDER — IOPAMIDOL (ISOVUE-300) INJECTION 61%
INTRAVENOUS | Status: AC
  Filled 2017-04-22: qty 30

## 2017-04-22 MED ORDER — HYDROCODONE-ACETAMINOPHEN 5-325 MG PO TABS
1.0000 | ORAL_TABLET | Freq: Once | ORAL | Status: AC
  Administered 2017-04-22: 1 via ORAL
  Filled 2017-04-22: qty 1

## 2017-04-22 NOTE — ED Notes (Signed)
Patient transported to CT 

## 2017-04-22 NOTE — ED Provider Notes (Signed)
Elizabeth Mcmillan Provider Note   CSN: [id number] Arrival date & time: 04/21/17  1859     History   Chief Complaint Chief Complaint  Patient presents with  . Flank Pain  . Dizziness    HPI Elizabeth Mcmillan is a 76 y.o. female.  HPI  This is a 69 old female with history of diabetes, hypertension, hypercholesterolemia who presents with right-sided abdominal pain and flank pain. Patient reports several day history of nausea. She was seen in urgent care yesterday and diagnosed with "maybe a UTI." She was started on Bactrim. This morning she woke up with right-sided abdominal and flank pain. She states that she only has pain when she moves. Currently she is pain-free. She does report nausea with eating which has been persistent. No fevers, back pain, hematuria. She has noted some dysuria. She reports dizziness which she describes as lightheadedness.  Past Medical History:  Diagnosis Date  . Anxiety   . Diabetes mellitus without complication ([place])   . GERD (gastroesophageal reflux disease)   . Hypercholesteremia   . Hypertension     Patient Active Problem List   Diagnosis Date Noted  . Herniated lumbar intervertebral disc 05/09/2015  . Lumbar disc prolapse with compression radiculopathy 05/07/2015  . Diabetes mellitus without complication ([place]) 03/47/4259  . Hypertension 05/07/2015  . Hypercholesteremia 05/07/2015  . Herniated nucleus pulposus, L3-4 right     Past Surgical History:  Procedure Laterality Date  . ABDOMINAL HYSTERECTOMY    . BREAST EXCISIONAL BIOPSY Left years ago   negative  . COLON SURGERY    . COLONOSCOPY WITH PROPOFOL N/A 09/04/2016   Procedure: COLONOSCOPY WITH PROPOFOL;  Surgeon: Manya Silvas, MD;  Location[facility];  Service: Endoscopy;  Laterality: N/A;  . LUMBAR LAMINECTOMY/ DECOMPRESSION WITH MET-RX Right 05/09/2015   Procedure: Far Lateral Lumbar Three-Four Microdiscectomy WITH MET-RX;  Surgeon: Erline Levine, MD;  Location: [place] NEURO  ORS;  Service: Neurosurgery;  Laterality: Right;    OB History    No data available       Home Medications    Prior to Admission medications   Medication Sig Start Date End Date Taking? Authorizing Provider  acetaminophen (TYLENOL) 500 MG tablet Take 1,000 mg by mouth every 6 (six) hours as needed (pain).   Yes [provider]  aspirin EC 81 MG tablet Take 81 mg by mouth at bedtime.   Yes [provider]  atenolol-chlorthalidone (TENORETIC) 50-25 MG tablet Take 1 tablet by mouth daily.   Yes [provider]  CALCIUM PO Take 1 tablet by mouth daily.   Yes [provider]  dapagliflozin propanediol (FARXIGA) 5 MG TABS tablet Take 5 mg by mouth daily.   Yes [provider]  diphenhydramine-acetaminophen (TYLENOL PM) 25-500 MG TABS tablet Take 1 tablet by mouth at bedtime as needed (SLEEP).    Yes [provider]  MELATONIN PO Take 1 tablet by mouth at bedtime.   Yes [provider]  meloxicam (MOBIC) 7.5 MG tablet Take 1 tablet (7.5 mg total) by mouth daily. 05/06/15  Yes Sable Feil, PA-C  Menthol, Topical Analgesic, (BIOFREEZE EX) Apply 1 application topically 2 (two) times daily as needed (pain).   Yes [provider]  metFORMIN (GLUCOPHAGE-XR) 750 MG 24 hr tablet Take 750 mg by mouth 2 (two) times daily. Morning and bedtime   Yes [provider]  naproxen sodium (ALEVE) 220 MG tablet Take 440 mg by mouth daily as needed (pain).   Yes [provider]  rosuvastatin (CRESTOR) 40 MG tablet Take 40 mg by mouth daily.   Yes [provider]  sulfamethoxazole-trimethoprim (BACTRIM DS,SEPTRA DS) 800-160 MG tablet Take 1 tablet by mouth 2 (two) times daily.   Yes [provider]  venlafaxine XR (EFFEXOR-XR) 150 MG 24 hr capsule Take 150 mg by mouth daily.   Yes [provider]  HYDROcodone-acetaminophen (NORCO/VICODIN) 5-325 MG tablet Take 1 tablet by mouth every 6 (six) hours as  needed. 04/22/17   Horton, Barbette Hair, MD    Family History Family History  Problem Relation Age of Onset  . Breast cancer Neg Hx     Social History Social History  Substance Use Topics  . Smoking status: Never Smoker  . Smokeless tobacco: Never Used  . Alcohol use No     Allergies   Patient has no known allergies.   Review of Systems Review of Systems  Constitutional: Negative for fever.  Respiratory: Negative for shortness of breath.   Cardiovascular: Negative for chest pain.  Gastrointestinal: Positive for abdominal pain and nausea. Negative for constipation, diarrhea and vomiting.  Genitourinary: Positive for dysuria. Negative for hematuria.  Musculoskeletal: Negative for back pain.  All other systems reviewed and are negative.    Physical Exam Updated Vital Signs BP 134/78   Pulse 66   Temp 98.6 F (37 C) (Oral)   Resp 20   Ht 5' (1.524 m)   Wt 53.5 kg (118 lb)   SpO2 99%   BMI 23.05 kg/m   Physical Exam  Constitutional: She is oriented to person, place, and time. She appears well-developed and well-nourished.  HENT:  Head: Normocephalic and atraumatic.  Cardiovascular: Normal rate, regular rhythm and normal heart sounds.   No murmur heard. Pulmonary/Chest: Effort normal and breath sounds normal. No respiratory distress. She has no wheezes.  Abdominal: Soft. Bowel sounds are normal. She exhibits no distension. There is tenderness. There is no guarding.  Right upper and mid abdominal tenderness to palpation without rebound or guarding  Neurological: She is alert and oriented to person, place, and time.  Skin: Skin is warm and dry.  Psychiatric: She has a normal mood and affect.  Nursing note and vitals reviewed.    ED Treatments / Results  Labs (all labs ordered are listed, but only abnormal results are displayed) Labs Reviewed  BASIC METABOLIC PANEL - Abnormal; Notable for the following:       Result Value   Potassium 3.4 (*)    Chloride 100  (*)    Glucose, Bld 102 (*)    BUN 33 (*)    Creatinine, Ser 1.50 (*)    GFR calc non Af Amer 33 (*)    GFR calc Af Amer 38 (*)    All other components within normal limits  URINALYSIS, ROUTINE W REFLEX MICROSCOPIC - Abnormal; Notable for the following:    Glucose, UA >=500 (*)    Protein, ur 30 (*)    Leukocytes, UA TRACE (*)    Bacteria, UA RARE (*)    Squamous Epithelial / LPF 0-5 (*)    All other components within normal limits  HEPATIC FUNCTION PANEL - Abnormal; Notable for the following:    Total Protein 6.4 (*)    Bilirubin, Direct <0.1 (*)    All other components within normal limits  CBC  LIPASE, BLOOD  CBG MONITORING, ED    EKG  EKG Interpretation  Date/Time:  Monday April 21 2017 19:28:15 EDT Ventricular Rate:  66  PR Interval:  154 QRS Duration: 78 QT Interval:  446 QTC Calculation: 467 R Axis:   31 Text Interpretation:  Normal sinus rhythm Nonspecific ST abnormality Abnormal ECG since last tracing no significant change Confirmed by Daleen Bo [id number]) on 04/21/2017 9:12:37 PM       Radiology Ct Abdomen Pelvis Wo Contrast  Result Date: 04/22/2017 CLINICAL DATA:  Right-sided abdominal pain. EXAM: CT ABDOMEN AND PELVIS WITHOUT CONTRAST TECHNIQUE: Multidetector CT imaging of the abdomen and pelvis was performed following the standard protocol without IV contrast. COMPARISON:  07/28/2013 FINDINGS: Lower chest: The lung bases are clear. Hepatobiliary: No focal liver abnormality is seen. No gallstones, gallbladder wall thickening, or biliary dilatation. Pancreas: Unremarkable. No pancreatic ductal dilatation or surrounding inflammatory changes. Spleen: Normal in size without focal abnormality. Adrenals/Urinary Tract: Adrenal glands are unremarkable. Kidneys are normal, without renal calculi, focal lesion, or hydronephrosis. Bladder is unremarkable. Stomach/Bowel: Stomach, small bowel, and colon are not abnormally distended. No wall thickening is appreciated. Contrast  material flows through to the colon without evidence of bowel obstruction. Diffusely stool-filled colon. Appendix is not identified. A short segment focal area of small bowel narrowing is demonstrated in the pelvis. This is likely to represent normal peristaltic changes. Vascular/Lymphatic: Aortic atherosclerosis. No enlarged abdominal or pelvic lymph nodes. Reproductive: Uterus and bilateral adnexa are unremarkable. Other: No abdominal wall hernia or abnormality. No abdominopelvic ascites. Musculoskeletal: Degenerative changes in the spine. No destructive bone lesions. IMPRESSION: No acute process demonstrated in the abdomen or pelvis to account for abdominal pain. No evidence of bowel obstruction or inflammation. Aortic atherosclerosis. Electronically Signed   By: Lucienne Capers M.D.   On: 04/22/2017 04:16    Procedures Procedures (including critical care time)  Medications Ordered in ED Medications  iopamidol (ISOVUE-300) 61 % injection (not administered)  HYDROcodone-acetaminophen (NORCO/VICODIN) 5-325 MG per tablet 1 tablet (not administered)  sodium chloride 0.9 % bolus 1,000 mL (0 mLs Intravenous Stopped 04/22/17 0057)  ondansetron (ZOFRAN) injection 4 mg (4 mg Intravenous Given 04/21/17 2336)     Initial Impression / Assessment and Plan / ED Course  I have reviewed the triage vital signs and the nursing notes.  Pertinent labs & imaging results that were available during my care of the patient were reviewed by me and considered in my medical decision making (see chart for details).     Patient presents with right-sided flank pain and dizziness. She is nontoxic on exam. Vital signs reassuring. She does have some tenderness palpation in the right midabdomen. Reports currently being on an antibiotic for UTI. She is afebrile. Basic labwork obtained and largely reassuring. She does have mild acute kidney injury likely related to dehydration. She is given fluids and nausea medication. CT scan  obtained to evaluate for kidney stones, Pyelo, appendicitis. CT scan is unremarkable. I discussed the results with patient and her daughter. She'll be given a short course of pain medication at discharge. Monitor for worsening signs or symptoms. Follow-up with PCP.  After history, exam, and medical workup I feel the patient has been appropriately medically screened and is safe for discharge home. Pertinent diagnoses were discussed with the patient. Patient was given return precautions.   Final Clinical Impressions(s) / ED Diagnoses   Final diagnoses:  Right flank pain  AKI (acute kidney injury) ([place])  Dizziness    New Prescriptions New Prescriptions   HYDROCODONE-ACETAMINOPHEN (NORCO/VICODIN) 5-325 MG TABLET    Take 1 tablet by mouth every 6 (six) hours as needed.  Merryl Hacker, MD 04/22/17 [phone]

## 2017-04-22 NOTE — ED Notes (Signed)
Pt denies dizziness or weakness

## 2017-04-22 NOTE — Discharge Instructions (Signed)
You were seen today for right-sided flank pain and nausea. Your workup is largely reassuring.  The cause of your symptoms at this time is unclear. He will be given a short course of pain medication. If you develop any new or worsening symptoms including nausea, vomiting, fevers, rash, you need to be reevaluated. Continue Bactrim for UTI.

## 2018-03-31 ENCOUNTER — Other Ambulatory Visit: Payer: Self-pay | Admitting: Nurse Practitioner

## 2018-03-31 DIAGNOSIS — Z1231 Encounter for screening mammogram for malignant neoplasm of breast: Secondary | ICD-10-CM

## 2018-08-10 ENCOUNTER — Ambulatory Visit
Admission: RE | Admit: 2018-08-10 | Discharge: 2018-08-10 | Disposition: A | Discharge status: Home / Self Care | Payer: Medicare Other | Source: Ambulatory Visit | Attending: Nurse Practitioner | Admitting: Nurse Practitioner

## 2018-08-10 DIAGNOSIS — Z1231 Encounter for screening mammogram for malignant neoplasm of breast: Secondary | ICD-10-CM | POA: Diagnosis present

## 2018-09-30 ENCOUNTER — Other Ambulatory Visit: Payer: Self-pay | Admitting: Nurse Practitioner

## 2018-09-30 DIAGNOSIS — E041 Nontoxic single thyroid nodule: Secondary | ICD-10-CM

## 2018-10-09 ENCOUNTER — Ambulatory Visit: Payer: Medicare Other

## 2018-12-11 ENCOUNTER — Ambulatory Visit
Admission: RE | Admit: 2018-12-11 | Discharge: 2018-12-11 | Disposition: A | Discharge status: Home / Self Care | Payer: Medicare Other | Source: Ambulatory Visit | Attending: Nurse Practitioner | Admitting: Nurse Practitioner

## 2018-12-11 ENCOUNTER — Other Ambulatory Visit: Payer: Self-pay

## 2018-12-11 DIAGNOSIS — E041 Nontoxic single thyroid nodule: Secondary | ICD-10-CM | POA: Diagnosis not present

## 2019-10-05 ENCOUNTER — Other Ambulatory Visit: Payer: Self-pay | Admitting: Nurse Practitioner

## 2019-10-05 DIAGNOSIS — Z1231 Encounter for screening mammogram for malignant neoplasm of breast: Secondary | ICD-10-CM

## 2019-10-05 DIAGNOSIS — M8580 Other specified disorders of bone density and structure, unspecified site: Secondary | ICD-10-CM

## 2019-12-07 ENCOUNTER — Emergency Department (HOSPITAL_COMMUNITY)
Admission: EM | Admit: 2019-12-07 | Discharge: 2019-12-07 | Disposition: A | Discharge status: Home / Self Care | Payer: Medicare Other | Attending: Emergency Medicine | Admitting: Emergency Medicine

## 2019-12-07 ENCOUNTER — Encounter (HOSPITAL_COMMUNITY): Payer: Self-pay | Admitting: Emergency Medicine

## 2019-12-07 ENCOUNTER — Other Ambulatory Visit: Payer: Self-pay

## 2019-12-07 DIAGNOSIS — E119 Type 2 diabetes mellitus without complications: Secondary | ICD-10-CM | POA: Diagnosis not present

## 2019-12-07 DIAGNOSIS — Z7984 Long term (current) use of oral hypoglycemic drugs: Secondary | ICD-10-CM | POA: Insufficient documentation

## 2019-12-07 DIAGNOSIS — M545 Low back pain, unspecified: Secondary | ICD-10-CM

## 2019-12-07 DIAGNOSIS — I10 Essential (primary) hypertension: Secondary | ICD-10-CM | POA: Insufficient documentation

## 2019-12-07 DIAGNOSIS — Z7982 Long term (current) use of aspirin: Secondary | ICD-10-CM | POA: Insufficient documentation

## 2019-12-07 DIAGNOSIS — Z79899 Other long term (current) drug therapy: Secondary | ICD-10-CM | POA: Diagnosis not present

## 2019-12-07 MED ORDER — PREDNISONE 20 MG PO TABS
40.0000 mg | ORAL_TABLET | Freq: Once | ORAL | Status: AC
  Administered 2019-12-07: 40 mg via ORAL
  Filled 2019-12-07: qty 2

## 2019-12-07 MED ORDER — ACETAMINOPHEN 500 MG PO TABS
1000.0000 mg | ORAL_TABLET | Freq: Once | ORAL | Status: AC
  Administered 2019-12-07: 1000 mg via ORAL
  Filled 2019-12-07: qty 2

## 2019-12-07 MED ORDER — METHYLPREDNISOLONE 4 MG PO TBPK: ORAL_TABLET | ORAL | 0 refills | Status: DC

## 2019-12-07 MED ORDER — LIDOCAINE 5 % EX PTCH
1.0000 | MEDICATED_PATCH | CUTANEOUS | Status: DC
  Administered 2019-12-07: 1 via TRANSDERMAL
  Filled 2019-12-07: qty 1

## 2019-12-07 MED ORDER — METHOCARBAMOL 500 MG PO TABS
500.0000 mg | ORAL_TABLET | Freq: Once | ORAL | Status: AC
  Administered 2019-12-07: 500 mg via ORAL
  Filled 2019-12-07: qty 1

## 2019-12-07 MED ORDER — METHOCARBAMOL 500 MG PO TABS: 500.0000 mg | ORAL_TABLET | Freq: Two times a day (BID) | ORAL | 0 refills | Status: DC

## 2019-12-07 NOTE — ED Notes (Signed)
Patient able to ambulate without assistance in room

## 2019-12-07 NOTE — ED Triage Notes (Addendum)
Pt brought in by [facility] for back pain, increased from her chronic pain since last night, denies any pain management for chronic back pain, having difficulty standing

## 2019-12-07 NOTE — Discharge Instructions (Addendum)
HOME INSTRUCTIONS Self - care:  The application of heat can help soothe the pain.  Maintaining your daily activities, including walking (this is encouraged), as it will help you get better faster than just staying in bed. Do not life, push, pull anything more than 10 pounds for the next week. I am attaching back exercises that you can do at home to help facilitate your recovery.     Medications are also useful to help with pain control.   Acetaminophen.  This medication is generally safe, and found over the counter. Take as directed for your age. You should not take more than 8 of the extra strength (500mg ) pills a day (max dose is 4000mg  total OVER one day)  Non steroidal anti inflammatory cream: Diclofenac (Voltaren) gel can be applied directly to the area of pain.   Lidocaine Patch: Salon Pas lidocaine patches (blue and silver box) can be purchased over the counter and worn for 12 hours for local pain relief   Muscle relaxants:  These medications can help with muscle tightness that is a cause of lower back pain.  Most of these medications can cause drowsiness, and it is not safe to drive or use dangerous machinery while taking them. They are primarily helpful when taken at night before sleep.  Prednisone -  This is an oral steroid.  This medication is best taken with food in the morning.  Please note that this medication can cause anxiety, mood swings, muscle fatigue, increased hunger, weight gain (sodium/fluid retention), poor sleep as well as other symptoms. If you are a diabetic, please monitor your blood sugars at home as this medication can increase your blood sugars. Call your pharmacist if you have any questions.  You will need to follow up with your primary healthcare provider  and Dr. Vertell Limber in 1-2 weeks for reassessment and persistent symptoms.  Be aware that if you develop new symptoms, such as a fever, leg weakness, difficulty with or loss of control of your urine or bowels, abdominal  pain, or more severe pain, you will need to seek medical attention and/or return to the Emergency department. Additional Information:  Your vital signs today were: BP (!) 159/56 (BP Location: Left Arm)   Pulse 73   Temp 98.1 F (36.7 C) (Oral)   Resp 17   Ht 5' (1.524 m)   Wt 56.7 kg   SpO2 95%   BMI 24.41 kg/m  If your blood pressure (BP) was elevated above 135/85 this visit, please have this repeated by your doctor within one month. ---------------

## 2019-12-07 NOTE — ED Provider Notes (Addendum)
[facility] EMERGENCY DEPARTMENT Provider Note   CSN: [id number] Arrival date & time: 12/07/19  1358     History Chief Complaint  Patient presents with  . Back Pain    Elizabeth Mcmillan is a 79 y.o. female.  Elizabeth Mcmillan is a 79 y.o. female with a history of hypertension hyperlipidemia, diabetes, GERD, anxiety, herniated disc s/p surgical repair, who presents to the ED for evaluation of back pain.  Patient reports left low back pain began last night.  She denies any traumatic injury or strenuous activity.  Reports this feels similar to previous back pain but is not as severe.  She reports pain starts in the low back and radiates into the buttock but does not at all radiate into the leg.  She reports that pain made it hard for her to walk today but she has not had any numbness or weakness like she did with her previous back pain prior to surgery.  No loss of bowel or bladder control or saddle anesthesia.  No associated abdominal pain.  No urinary symptoms, no fevers or chills.  Patient had herniated disc repair by Dr. Vertell Limber in 2016, since then has been doing pretty well with her back pain and does not take any chronic medication for this.  Has not followed up with Dr. Vertell Limber regarding this pain.        Past Medical History:  Diagnosis Date  . Anxiety   . Diabetes mellitus without complication ([place])   . GERD (gastroesophageal reflux disease)   . Hypercholesteremia   . Hypertension     Patient Active Problem List   Diagnosis Date Noted  . Herniated lumbar intervertebral disc 05/09/2015  . Lumbar disc prolapse with compression radiculopathy 05/07/2015  . Diabetes mellitus without complication ([place]) 33/35/4562  . Hypertension 05/07/2015  . Hypercholesteremia 05/07/2015  . Herniated nucleus pulposus, L3-4 right     Past Surgical History:  Procedure Laterality Date  . ABDOMINAL HYSTERECTOMY    . BREAST EXCISIONAL BIOPSY Left years ago   negative  . COLON SURGERY    .  COLONOSCOPY WITH PROPOFOL N/A 09/04/2016   Procedure: COLONOSCOPY WITH PROPOFOL;  Surgeon: Manya Silvas, MD;  Location: [facility] ENDOSCOPY;  Service: Endoscopy;  Laterality: N/A;  . LUMBAR LAMINECTOMY/ DECOMPRESSION WITH MET-RX Right 05/09/2015   Procedure: Far Lateral Lumbar Three-Four Microdiscectomy WITH MET-RX;  Surgeon: Erline Levine, MD;  Location: [facility];  Service: Neurosurgery;  Laterality: Right;     OB History   No obstetric history on file.     Family History  Problem Relation Age of Onset  . Breast cancer Neg Hx     Social History   Tobacco Use  . Smoking status: Never Smoker  . Smokeless tobacco: Never Used  Substance Use Topics  . Alcohol use: No  . Drug use: No    Home Medications Prior to Admission medications   Medication Sig Start Date End Date Taking? Authorizing Provider  acetaminophen (TYLENOL) 500 MG tablet Take 1,000 mg by mouth every 6 (six) hours as needed (pain).    [provider]  aspirin EC 81 MG tablet Take 81 mg by mouth at bedtime.    [provider]  atenolol-chlorthalidone (TENORETIC) 50-25 MG tablet Take 1 tablet by mouth daily.    [provider]  CALCIUM PO Take 1 tablet by mouth daily.    [provider]  dapagliflozin propanediol (FARXIGA) 5 MG TABS tablet Take 5 mg by mouth daily.  [provider]  diphenhydramine-acetaminophen (TYLENOL PM) 25-500 MG TABS tablet Take 1 tablet by mouth at bedtime as needed (SLEEP).     [provider]  HYDROcodone-acetaminophen (NORCO/VICODIN) 5-325 MG tablet Take 1 tablet by mouth every 6 (six) hours as needed. 04/22/17   Horton, Barbette Hair, MD  MELATONIN PO Take 1 tablet by mouth at bedtime.    [provider]  meloxicam (MOBIC) 7.5 MG tablet Take 1 tablet (7.5 mg total) by mouth daily. 05/06/15   Sable Feil, PA-C  Menthol, Topical Analgesic, (BIOFREEZE EX) Apply 1 application topically 2 (two) times daily as needed (pain).     [provider]  metFORMIN (GLUCOPHAGE-XR) 750 MG 24 hr tablet Take 750 mg by mouth 2 (two) times daily. Morning and bedtime    [provider]  naproxen sodium (ALEVE) 220 MG tablet Take 440 mg by mouth daily as needed (pain).    [provider]  rosuvastatin (CRESTOR) 40 MG tablet Take 40 mg by mouth daily.    [provider]  sulfamethoxazole-trimethoprim (BACTRIM DS,SEPTRA DS) 800-160 MG tablet Take 1 tablet by mouth 2 (two) times daily.    [provider]  venlafaxine XR (EFFEXOR-XR) 150 MG 24 hr capsule Take 150 mg by mouth daily.    [provider]    Allergies    Patient has no known allergies.  Review of Systems   Review of Systems  Constitutional: Negative for chills and fever.  HENT: Negative.   Respiratory: Negative for shortness of breath.   Cardiovascular: Negative for chest pain.  Gastrointestinal: Negative for abdominal pain, constipation, diarrhea, nausea and vomiting.  Genitourinary: Negative for dysuria, flank pain, frequency and hematuria.  Musculoskeletal: Positive for back pain. Negative for arthralgias, gait problem, joint swelling, myalgias and neck pain.  Skin: Negative for color change, rash and wound.  Neurological: Negative for weakness and numbness.    Physical Exam Updated Vital Signs BP (!) 159/56 (BP Location: Left Arm)   Pulse 73   Temp 98.1 F (36.7 C) (Oral)   Resp 17   Ht 5' (1.524 m)   Wt 56.7 kg   SpO2 95%   BMI 24.41 kg/m   Physical Exam Vitals and nursing note reviewed.  Constitutional:      General: She is not in acute distress.    Appearance: Normal appearance. She is well-developed and normal weight. She is not ill-appearing or diaphoretic.  HENT:     Head: Atraumatic.  Eyes:     General:        Right eye: No discharge.        Left eye: No discharge.  Cardiovascular:     Rate and Rhythm: Normal rate and regular rhythm.     Pulses: Normal pulses.          Radial pulses  are 2+ on the right side and 2+ on the left side.       Dorsalis pedis pulses are 2+ on the right side and 2+ on the left side.       Posterior tibial pulses are 2+ on the right side and 2+ on the left side.     Heart sounds: Normal heart sounds. No murmur. No friction rub. No gallop.   Pulmonary:     Effort: Pulmonary effort is normal. No respiratory distress.     Breath sounds: Normal breath sounds.     Comments: Respirations equal and unlabored, patient able to speak in full sentences, lungs clear to auscultation  bilaterally Abdominal:     General: Bowel sounds are normal. There is no distension.     Palpations: Abdomen is soft. There is no mass.     Tenderness: There is no abdominal tenderness. There is no guarding.     Comments: Abdomen soft, nondistended, nontender to palpation in all quadrants without guarding or peritoneal signs, no CVA tenderness bilaterally  Musculoskeletal:     Cervical back: Neck supple.     Comments: Tenderness to palpation over left low back musculature and buttock, no focal midline spinal tenderness.  Pain made worse with range of motion of the lower extremities, but negative straight leg raise bilaterally  Skin:    General: Skin is warm and dry.     Capillary Refill: Capillary refill takes less than 2 seconds.  Neurological:     Mental Status: She is alert and oriented to person, place, and time.     Comments: Alert, clear speech, following commands. Moving all extremities without difficulty. Bilateral lower extremities with 5/5 strength in proximal and distal muscle groups and with dorsi and plantar flexion. Sensation intact in bilateral lower extremities. 2+ patellar DTRs bilaterally. Ambulatory with steady gait  Psychiatric:        Mood and Affect: Mood normal.        Behavior: Behavior normal.     ED Results / Procedures / Treatments   Labs (all labs ordered are listed, but only abnormal results are displayed) Labs Reviewed - No data to  display  EKG None  Radiology No results found.  Procedures Procedures (including critical care time)  Medications Ordered in ED Medications  lidocaine (LIDODERM) 5 % 1 patch (1 patch Transdermal Patch Applied 12/07/19 1501)  methocarbamol (ROBAXIN) tablet 500 mg (500 mg Oral Given 12/07/19 1500)  acetaminophen (TYLENOL) tablet 1,000 mg (1,000 mg Oral Given 12/07/19 1501)  predniSONE (DELTASONE) tablet 40 mg (40 mg Oral Given 12/07/19 1500)    ED Course  I have reviewed the triage vital signs and the nursing notes.  Pertinent labs & imaging results that were available during my care of the patient were reviewed by me and considered in my medical decision making (see chart for details).    MDM Rules/Calculators/A&P                      Patient with back pain.  Pain is well localized to the left low back musculature and reproducible with palpation, radiates into the buttock but no radiation through the leg and no midline spinal tenderness.  History of previous surgery for herniated disc repair.  Prior to patient's previous surgery she was having weakness and radicular symptoms.  No neurological deficits and normal neuro exam.  Patient can walk but states is painful.  No loss of bowel or bladder control.  No concern for cauda equina.  No fever, night sweats, weight loss, h/o cancer, IVDU.  RICE protocol and pain medicine indicated and discussed with patient.  Will have patient follow-up with Dr. Vertell Limber who did her initial back surgery.  Patient's daughter arrived at bedside expresses concern that her mother has not had any test performed.  I had long discussion with patient's daughter regarding indications for imaging and her mother's reassuring neurologic exam.  She expresses concern that her mother could not walk today, patient is feeling much more comfortable after receiving treatment for her pain here in the ED.  After discussion with daughter patient was able to ambulate in the department  without requiring  any assistance, feel that patient is stable for outpatient follow-up with her neurosurgeon.  Final Clinical Impression(s) / ED Diagnoses Final diagnoses:  Acute left-sided low back pain without sciatica    Rx / DC Orders ED Discharge Orders         Ordered    methocarbamol (ROBAXIN) 500 MG tablet  2 times daily     12/07/19 1554    methylPREDNISolone (MEDROL DOSEPAK) 4 MG TBPK tablet     12/07/19 1554              Benedetto Goad [place], [place] 12/07/19 1622    Milton Ferguson, MD 12/08/19 [phone]

## 2020-05-18 ENCOUNTER — Other Ambulatory Visit: Payer: Self-pay | Admitting: Nurse Practitioner

## 2020-05-18 ENCOUNTER — Other Ambulatory Visit (HOSPITAL_COMMUNITY): Payer: Self-pay | Admitting: Nurse Practitioner

## 2020-05-18 DIAGNOSIS — E041 Nontoxic single thyroid nodule: Secondary | ICD-10-CM

## 2020-07-27 ENCOUNTER — Other Ambulatory Visit: Payer: Self-pay | Admitting: Nurse Practitioner

## 2020-07-27 DIAGNOSIS — Z1231 Encounter for screening mammogram for malignant neoplasm of breast: Secondary | ICD-10-CM

## 2020-07-27 DIAGNOSIS — M8580 Other specified disorders of bone density and structure, unspecified site: Secondary | ICD-10-CM

## 2020-09-04 ENCOUNTER — Other Ambulatory Visit: Payer: Self-pay | Admitting: Nurse Practitioner

## 2020-09-04 DIAGNOSIS — E041 Nontoxic single thyroid nodule: Secondary | ICD-10-CM

## 2020-09-11 ENCOUNTER — Other Ambulatory Visit: Payer: Self-pay

## 2020-09-11 ENCOUNTER — Ambulatory Visit
Admission: RE | Admit: 2020-09-11 | Discharge: 2020-09-11 | Disposition: A | Discharge status: Home / Self Care | Payer: Medicare Other | Source: Ambulatory Visit | Attending: Nurse Practitioner | Admitting: Nurse Practitioner

## 2020-09-11 DIAGNOSIS — E041 Nontoxic single thyroid nodule: Secondary | ICD-10-CM | POA: Diagnosis not present

## 2021-01-17 ENCOUNTER — Encounter (HOSPITAL_BASED_OUTPATIENT_CLINIC_OR_DEPARTMENT_OTHER): Payer: Self-pay | Admitting: Emergency Medicine

## 2021-01-17 ENCOUNTER — Emergency Department (HOSPITAL_BASED_OUTPATIENT_CLINIC_OR_DEPARTMENT_OTHER): Payer: Medicare Other | Admitting: Radiology

## 2021-01-17 ENCOUNTER — Other Ambulatory Visit: Payer: Self-pay

## 2021-01-17 ENCOUNTER — Inpatient Hospital Stay (HOSPITAL_BASED_OUTPATIENT_CLINIC_OR_DEPARTMENT_OTHER)
Admission: EM | Admit: 2021-01-17 | Discharge: 2021-01-19 | DRG: 247 | Disposition: A | Discharge status: Home / Self Care | Payer: Medicare Other | Attending: Cardiovascular Disease | Admitting: Cardiovascular Disease

## 2021-01-17 DIAGNOSIS — Z7984 Long term (current) use of oral hypoglycemic drugs: Secondary | ICD-10-CM

## 2021-01-17 DIAGNOSIS — Z955 Presence of coronary angioplasty implant and graft: Secondary | ICD-10-CM

## 2021-01-17 DIAGNOSIS — I214 Non-ST elevation (NSTEMI) myocardial infarction: Secondary | ICD-10-CM | POA: Diagnosis present

## 2021-01-17 DIAGNOSIS — E119 Type 2 diabetes mellitus without complications: Secondary | ICD-10-CM

## 2021-01-17 DIAGNOSIS — N1832 Chronic kidney disease, stage 3b: Secondary | ICD-10-CM | POA: Diagnosis present

## 2021-01-17 DIAGNOSIS — I2510 Atherosclerotic heart disease of native coronary artery without angina pectoris: Secondary | ICD-10-CM | POA: Diagnosis not present

## 2021-01-17 DIAGNOSIS — I129 Hypertensive chronic kidney disease with stage 1 through stage 4 chronic kidney disease, or unspecified chronic kidney disease: Secondary | ICD-10-CM | POA: Diagnosis present

## 2021-01-17 DIAGNOSIS — K219 Gastro-esophageal reflux disease without esophagitis: Secondary | ICD-10-CM | POA: Diagnosis present

## 2021-01-17 DIAGNOSIS — I10 Essential (primary) hypertension: Secondary | ICD-10-CM | POA: Diagnosis present

## 2021-01-17 DIAGNOSIS — Z8249 Family history of ischemic heart disease and other diseases of the circulatory system: Secondary | ICD-10-CM

## 2021-01-17 DIAGNOSIS — E785 Hyperlipidemia, unspecified: Secondary | ICD-10-CM | POA: Diagnosis not present

## 2021-01-17 DIAGNOSIS — Z7982 Long term (current) use of aspirin: Secondary | ICD-10-CM | POA: Diagnosis not present

## 2021-01-17 DIAGNOSIS — Z79899 Other long term (current) drug therapy: Secondary | ICD-10-CM | POA: Diagnosis not present

## 2021-01-17 DIAGNOSIS — M79601 Pain in right arm: Secondary | ICD-10-CM

## 2021-01-17 DIAGNOSIS — E782 Mixed hyperlipidemia: Secondary | ICD-10-CM | POA: Diagnosis present

## 2021-01-17 DIAGNOSIS — E1122 Type 2 diabetes mellitus with diabetic chronic kidney disease: Secondary | ICD-10-CM | POA: Diagnosis present

## 2021-01-17 DIAGNOSIS — E7800 Pure hypercholesterolemia, unspecified: Secondary | ICD-10-CM | POA: Diagnosis present

## 2021-01-17 DIAGNOSIS — Z20822 Contact with and (suspected) exposure to covid-19: Secondary | ICD-10-CM | POA: Diagnosis present

## 2021-01-17 DIAGNOSIS — Z791 Long term (current) use of non-steroidal anti-inflammatories (NSAID): Secondary | ICD-10-CM

## 2021-01-17 DIAGNOSIS — E876 Hypokalemia: Secondary | ICD-10-CM | POA: Diagnosis present

## 2021-01-17 HISTORY — DX: Non-ST elevation (NSTEMI) myocardial infarction: I21.4

## 2021-01-17 LAB — TROPONIN I (HIGH SENSITIVITY)
Troponin I (High Sensitivity): 1502 ng/L (ref <18)
Troponin I (High Sensitivity): 1596 ng/L (ref <18)

## 2021-01-17 LAB — CBC WITH DIFFERENTIAL/PLATELET
Abs Immature Granulocytes: 0.05 10*3/uL (ref 0.00–0.07)
Basophils Absolute: 0 10*3/uL (ref 0.0–0.1)
Basophils Relative: 0 %
Eosinophils Absolute: 0.1 10*3/uL (ref 0.0–0.5)
Eosinophils Relative: 1 %
HCT: 38.5 % (ref 36.0–46.0)
Hemoglobin: 12.9 g/dL (ref 12.0–15.0)
Immature Granulocytes: 1 %
Lymphocytes Relative: 11 %
Lymphs Abs: 1.1 10*3/uL (ref 0.7–4.0)
MCH: 33.1 pg (ref 26.0–34.0)
MCHC: 33.5 g/dL (ref 30.0–36.0)
MCV: 98.7 fL (ref 80.0–100.0)
Monocytes Absolute: 0.5 10*3/uL (ref 0.1–1.0)
Monocytes Relative: 5 %
Neutro Abs: 8.5 10*3/uL — ABNORMAL HIGH (ref 1.7–7.7)
Neutrophils Relative %: 82 %
Platelets: 214 10*3/uL (ref 150–400)
RBC: 3.9 MIL/uL (ref 3.87–5.11)
RDW: 13 % (ref 11.5–15.5)
WBC: 10.2 10*3/uL (ref 4.0–10.5)
nRBC: 0 % (ref 0.0–0.2)

## 2021-01-17 LAB — SEDIMENTATION RATE: Sed Rate: 23 mm/hr — ABNORMAL HIGH (ref 0–22)

## 2021-01-17 LAB — GLUCOSE, CAPILLARY: Glucose-Capillary: 181 mg/dL — ABNORMAL HIGH (ref 70–99)

## 2021-01-17 LAB — BASIC METABOLIC PANEL
Anion gap: 15 (ref 5–15)
BUN: 36 mg/dL — ABNORMAL HIGH (ref 8–23)
CO2: 25 mmol/L (ref 22–32)
Calcium: 9.7 mg/dL (ref 8.9–10.3)
Chloride: 98 mmol/L (ref 98–111)
Creatinine, Ser: 1.69 mg/dL — ABNORMAL HIGH (ref 0.44–1.00)
GFR, Estimated: 31 mL/min — ABNORMAL LOW (ref >60)
Glucose, Bld: 160 mg/dL — ABNORMAL HIGH (ref 70–99)
Potassium: 3.3 mmol/L — ABNORMAL LOW (ref 3.5–5.1)
Sodium: 138 mmol/L (ref 135–145)

## 2021-01-17 LAB — RESP PANEL BY RT-PCR (FLU A&B, COVID) ARPGX2
Influenza A by PCR: NEGATIVE
Influenza B by PCR: NEGATIVE
SARS Coronavirus 2 by RT PCR: NEGATIVE

## 2021-01-17 MED ORDER — SODIUM CHLORIDE 0.9 % WEIGHT BASED INFUSION
1.0000 mL/kg/h | INTRAVENOUS | Status: DC
  Administered 2021-01-17 – 2021-01-18 (×2): 1 mL/kg/h via INTRAVENOUS

## 2021-01-17 MED ORDER — VENLAFAXINE HCL ER 75 MG PO CP24
225.0000 mg | ORAL_CAPSULE | Freq: Every day | ORAL | Status: DC
  Administered 2021-01-19: 225 mg via ORAL
  Filled 2021-01-17: qty 1
  Filled 2021-01-17: qty 3

## 2021-01-17 MED ORDER — INSULIN ASPART 100 UNIT/ML IJ SOLN
0.0000 [IU] | Freq: Three times a day (TID) | INTRAMUSCULAR | Status: DC
Start: 2021-01-18 — End: 2021-01-19
  Administered 2021-01-18 – 2021-01-19 (×2): 2 [IU] via SUBCUTANEOUS

## 2021-01-17 MED ORDER — VENLAFAXINE HCL ER 75 MG PO CP24: 75.0000 mg | ORAL_CAPSULE | Freq: Every day | ORAL | Status: DC

## 2021-01-17 MED ORDER — ASPIRIN 81 MG PO CHEW
81.0000 mg | CHEWABLE_TABLET | ORAL | Status: AC
  Administered 2021-01-18: 81 mg via ORAL
  Filled 2021-01-17: qty 1

## 2021-01-17 MED ORDER — DIPHENHYDRAMINE-APAP (SLEEP) 25-500 MG PO TABS: 1.0000 | ORAL_TABLET | Freq: Every day | ORAL | Status: DC

## 2021-01-17 MED ORDER — ATENOLOL 25 MG PO TABS
50.0000 mg | ORAL_TABLET | Freq: Every day | ORAL | Status: DC
  Administered 2021-01-18: 50 mg via ORAL
  Filled 2021-01-17 (×2): qty 2

## 2021-01-17 MED ORDER — HEPARIN BOLUS VIA INFUSION
3400.0000 [IU] | Freq: Once | INTRAVENOUS | Status: AC
  Administered 2021-01-17: 3400 [IU] via INTRAVENOUS

## 2021-01-17 MED ORDER — SODIUM CHLORIDE 0.9 % IV SOLN: 250.0000 mL | INTRAVENOUS | Status: DC | PRN

## 2021-01-17 MED ORDER — ASPIRIN 81 MG PO CHEW
324.0000 mg | CHEWABLE_TABLET | Freq: Once | ORAL | Status: AC
  Administered 2021-01-17: 324 mg via ORAL
  Filled 2021-01-17 (×2): qty 4

## 2021-01-17 MED ORDER — ACETAMINOPHEN 500 MG PO TABS
500.0000 mg | ORAL_TABLET | Freq: Every day | ORAL | Status: DC
  Administered 2021-01-17 – 2021-01-18 (×2): 500 mg via ORAL
  Filled 2021-01-17 (×2): qty 1

## 2021-01-17 MED ORDER — DIPHENHYDRAMINE HCL 25 MG PO CAPS
25.0000 mg | ORAL_CAPSULE | Freq: Every day | ORAL | Status: DC
  Administered 2021-01-17 – 2021-01-18 (×2): 25 mg via ORAL
  Filled 2021-01-17 (×2): qty 1

## 2021-01-17 MED ORDER — SODIUM CHLORIDE 0.9% FLUSH: 3.0000 mL | INTRAVENOUS | Status: DC | PRN

## 2021-01-17 MED ORDER — ATORVASTATIN CALCIUM 80 MG PO TABS
80.0000 mg | ORAL_TABLET | Freq: Every day | ORAL | Status: DC
  Administered 2021-01-17 – 2021-01-19 (×3): 80 mg via ORAL
  Filled 2021-01-17 (×3): qty 1

## 2021-01-17 MED ORDER — HEPARIN (PORCINE) 25000 UT/250ML-% IV SOLN
700.0000 [IU]/h | INTRAVENOUS | Status: DC
  Administered 2021-01-17: 700 [IU]/h via INTRAVENOUS
  Filled 2021-01-17: qty 250

## 2021-01-17 MED ORDER — SODIUM CHLORIDE 0.9% FLUSH: 3.0000 mL | Freq: Two times a day (BID) | INTRAVENOUS | Status: DC

## 2021-01-17 NOTE — Progress Notes (Signed)
ANTICOAGULATION CONSULT NOTE - Initial Consult  Pharmacy Consult for Heparin Indication: chest pain/ACS  No Known Allergies  Patient Measurements: Height: 5' (152.4 cm) Weight: 56.7 kg (125 lb) IBW/kg (Calculated) : 45.5 Heparin Dosing Weight: 56.7 kg  Vital Signs: Temp: 97.8 F (36.6 C) (06/29 1411) Temp Source: Oral (06/29 1411) BP: 166/68 (06/29 1619) Pulse Rate: 77 (06/29 1619)  Labs: Recent Labs    01/17/21 1503  HGB 12.9  HCT 38.5  PLT 214  CREATININE 1.69*  TROPONINIHS 1,502*    Estimated Creatinine Clearance: 22 mL/min (A) (by C-G formula based on SCr of 1.69 mg/dL (H)).   Medical History: Past Medical History:  Diagnosis Date   Anxiety    Diabetes mellitus without complication ([facility])    GERD (gastroesophageal reflux disease)    Hypercholesteremia    Hypertension     Medications:  (Not in a hospital admission)  Scheduled:   heparin  3,400 Units Intravenous Once   Infusions:   heparin      Assessment: 61 yof presenting with bilateral arm/shoulder pain. Patient with history of DM, HLD, HTN, and anxiety. Troponin found to be elevated in ED. Patient is currently denying pain.  Patient is not on anticoagulation prior to arrival. CBC is stable and within normal limits.  Goal of Therapy:  Heparin level 0.3-0.7 units/ml Monitor platelets by anticoagulation protocol: Yes   Plan:  Give 3400 units bolus x 1 Start heparin infusion at 700 units/hr Check anti-Xa level in 8 hours and daily while on heparin Continue to monitor H&H and platelets  Lorelei Pont, PharmD, BCPS 01/17/2021 4:47 PM ED Clinical Pharmacist -  [phone]

## 2021-01-17 NOTE — H&P (Signed)
Cardiology Admission History and Physical:   Patient ID: Elizabeth Mcmillan MRN: [id number]; DOB: March 29, 1941   Admission date: 01/17/2021  PCP:  Renee Rival, NP   [facility] Providers Cardiologist:  None        Chief Complaint:  arm pain  Patient Profile:   Elizabeth Mcmillan is a 80 y.o. female with type 2 diabetes, hypertension, and mixed hyperlipidemia who is being seen 01/17/2021 for the evaluation of non-STEMI.  History of Present Illness:   Ms. Warr complains of aching in both arms that first occurred Monday and lasted for several hours and then recurred today. She also had pain in her left shoulder and pain in her 'ribs.' She has a history of arthritis but these symptoms are different than what she has experienced in the past. There was associated diaphoresis and shortness of breath, no nausea or vomiting. She has had no recurrent arm pain since IV heparin was started today but still complains of rib soreness when she moves certain ways.   She has had Type II DM for many years but has come off of oral hypoglycemics because of good glycemic control. She is also treated for HTN and dyslipidemia.  She is not aware of having chronic kidney disease but her creatinine has been mildly elevated for the past few years based on review of her records.  The patient has no personal history of coronary artery disease.  Her father had a lot of problems with coronary disease and ultimately died of a myocardial infarction at age 69.  The patient is a lifelong non-smoker but has other cardiac risk factors outlined above with type 2 diabetes, hypertension, and dyslipidemia.   Past Medical History:  Diagnosis Date   Anxiety    Diabetes mellitus without complication ([place])    GERD (gastroesophageal reflux disease)    Hypercholesteremia    Hypertension     Past Surgical History:  Procedure Laterality Date   ABDOMINAL HYSTERECTOMY     BREAST EXCISIONAL BIOPSY Left years ago   negative    COLON SURGERY     COLONOSCOPY WITH PROPOFOL N/A 09/04/2016   Procedure: COLONOSCOPY WITH PROPOFOL;  Surgeon: Manya Silvas, MD;  Location: [place];  Service: Endoscopy;  Laterality: N/A;   LUMBAR LAMINECTOMY/ DECOMPRESSION WITH MET-RX Right 05/09/2015   Procedure: Far Lateral Lumbar Three-Four Microdiscectomy WITH MET-RX;  Surgeon: Erline Levine, MD;  Location: [place] [facility];  Service: Neurosurgery;  Laterality: Right;     Medications Prior to Admission: Prior to Admission medications   Medication Sig Start Date End Date Taking? Authorizing Provider  acetaminophen (TYLENOL) 500 MG tablet Take 1,000 mg by mouth every 6 (six) hours as needed (pain).   Yes [provider]  aspirin EC 81 MG tablet Take 81 mg by mouth at bedtime.    [provider]  atenolol-chlorthalidone (TENORETIC) 50-25 MG tablet Take 1 tablet by mouth daily.    [provider]  atorvastatin (LIPITOR) 20 MG tablet Take 20 mg by mouth daily. 09/01/20   [provider]  CALCIUM PO Take 1 tablet by mouth daily.    [provider]  dapagliflozin propanediol (FARXIGA) 5 MG TABS tablet Take 5 mg by mouth daily.    [provider]  diphenhydramine-acetaminophen (TYLENOL PM) 25-500 MG TABS tablet Take 1 tablet by mouth at bedtime as needed (SLEEP).     [provider]  HYDROcodone-acetaminophen (NORCO/VICODIN) 5-325 MG tablet Take 1 tablet by mouth every 6 (six) hours as needed.  04/22/17   Horton, Barbette Hair, MD  MELATONIN PO Take 1 tablet by mouth at bedtime.    [provider]  meloxicam (MOBIC) 7.5 MG tablet Take 1 tablet (7.5 mg total) by mouth daily. 05/06/15   Sable Feil, PA-C  Menthol, Topical Analgesic, (BIOFREEZE EX) Apply 1 application topically 2 (two) times daily as needed (pain).    [provider]  metFORMIN (GLUCOPHAGE-XR) 750 MG 24 hr tablet Take 750 mg by mouth 2 (two) times daily. Morning and bedtime    [provider]   methocarbamol (ROBAXIN) 500 MG tablet Take 1 tablet (500 mg total) by mouth 2 (two) times daily. 12/07/19   Jacqlyn Larsen, PA-C  methylPREDNISolone (MEDROL DOSEPAK) 4 MG TBPK tablet Take as directed 12/07/19   Jacqlyn Larsen, PA-C  naproxen sodium (ALEVE) 220 MG tablet Take 440 mg by mouth daily as needed (pain).    [provider]  rosuvastatin (CRESTOR) 40 MG tablet Take 40 mg by mouth daily.    [provider]  sulfamethoxazole-trimethoprim (BACTRIM DS,SEPTRA DS) 800-160 MG tablet Take 1 tablet by mouth 2 (two) times daily.    [provider]  venlafaxine XR (EFFEXOR-XR) 150 MG 24 hr capsule Take 150 mg by mouth daily.    [provider]  venlafaxine XR (EFFEXOR-XR) 75 MG 24 hr capsule Take 75 mg by mouth daily. 12/04/20   [provider]     Allergies:   No Known Allergies  Social History:   Social History   Socioeconomic History   Marital status: Widowed    Spouse name: Not on file   Number of children: Not on file   Years of education: Not on file   Highest education level: Not on file  Occupational History   Not on file  Tobacco Use   Smoking status: Never   Smokeless tobacco: Never  Substance and Sexual Activity   Alcohol use: No   Drug use: No   Sexual activity: Not on file  Other Topics Concern   Not on file  Social History Narrative   Not on file   Social Determinants of Health   Financial Resource Strain: Not on file  Food Insecurity: Not on file  Transportation Needs: Not on file  Physical Activity: Not on file  Stress: Not on file  Social Connections: Not on file  Intimate Partner Violence: Not on file    Family History:   The patient's family history includes Coronary artery disease in her father; Heart attack in her father; Stroke in her father. There is no history of Breast cancer.    ROS:  Please see the history of present illness.  Positive for neck and back pain, arthritis, all other ROS reviewed and  negative.     Physical Exam/Data:   Vitals:   01/17/21 1645 01/17/21 1700 01/17/21 1715 01/17/21 1836  BP: (!) 163/67 (!) 142/112 (!) 183/64 (!) 119/93  Pulse: 77 75 77   Resp: _0 Temp:    98.5 F (36.9 C)  TempSrc:    Oral  SpO2: 100% 100% 100% 99%  Weight:      Height:       No intake or output data in the 24 hours ending 01/17/21 1933 Last 3 Weights 01/17/2021 12/07/2019 04/21/2017  Weight (lbs) 125 lb 125 lb 118 lb  Weight (kg) 56.7 kg 56.7 kg 53.524 kg     Body mass index is 24.41 kg/m.  General:  Well nourished,  well developed, in no acute distress HEENT: normal Lymph: no adenopathy Neck: no JVD Endocrine:  No thryomegaly Vascular: No carotid bruits; FA pulses 2+ bilaterally without bruits  Cardiac:  normal S1, S2; RRR; no murmur  Lungs:  clear to auscultation bilaterally, no wheezing, rhonchi or rales  Abd: soft, nontender, no hepatomegaly  Ext: no edema Musculoskeletal:  No deformities, BUE and BLE strength normal and equal Skin: warm and dry  Neuro:  CNs 2-12 intact, no focal abnormalities noted Psych:  Normal affect    EKG:  The ECG that was done today was personally reviewed and demonstrates NSR, ST-T changes consider anterior ischemia.   Relevant CV Studies: pending  Laboratory Data:  High Sensitivity Troponin:   Recent Labs  Lab 01/17/21 1503 01/17/21 1634  TROPONINIHS 1,502* 1,596*      Chemistry Recent Labs  Lab 01/17/21 1503  NA 138  K 3.3*  CL 98  CO2 25  GLUCOSE 160*  BUN 36*  CREATININE 1.69*  CALCIUM 9.7  GFRNONAA 31*  ANIONGAP 15    No results for input(s): PROT, ALBUMIN, AST, ALT, ALKPHOS, BILITOT in the last 168 hours. Hematology Recent Labs  Lab 01/17/21 1503  WBC 10.2  RBC 3.90  HGB 12.9  HCT 38.5  MCV 98.7  MCH 33.1  MCHC 33.5  RDW 13.0  PLT 214   BNPNo results for input(s): BNP, PROBNP in the last 168 hours.  DDimer No results for input(s): DDIMER in the last 168 hours.   Radiology/Studies:  DG  Chest 2 View  Result Date: 01/17/2021 CLINICAL DATA:  Chest pain EXAM: CHEST - 2 VIEW COMPARISON:  September 24, 2018 FINDINGS: Lungs are clear. Heart is upper normal in size with pulmonary vascularity normal. No adenopathy. No pneumothorax. There is degenerative change in the thoracic spine. IMPRESSION: Lungs clear.  Heart upper normal in size. Electronically Signed   By: Lowella Grip III M.D.   On: 01/17/2021 15:04     Assessment and Plan:   Non-STEMI: Patient with typical cardiac risk profile presents with bilateral arm and left shoulder pain, abnormal EKG with anteroseptal T wave changes suggestive of anterior ischemia, and elevated troponin consistent with non-STEMI ACS.  Fortunately she is chest pain-free on IV heparin.  She will continue on aspirin.  Definitive evaluation with cardiac catheterization is indicated.  I explained to her the potential treatment options depending on findings at cardiac catheterization.  She understands that these include medical therapy, PCI, and CABG.  She will be hydrated tonight in the setting of her chronic kidney disease and she will be kept n.p.o. after midnight for cardiac catheterization and possible PCI tomorrow. I have reviewed the risks, indications, and alternatives to cardiac catheterization, possible angioplasty, and stenting with the patient. Risks include but are not limited to bleeding, infection, vascular injury, stroke, myocardial infection, arrhythmia, kidney injury, radiation-related injury in the case of prolonged fluoroscopy use, emergency cardiac surgery, and death. The patient understands the risks of serious complication is 1-2 in 3016 with diagnostic cardiac cath and 1-2% or less with angioplasty/stenting.   Type 2 diabetes: We will cover with sliding scale insulin as needed Hypertension: Continue atenolol, hold chlorthalidone for cath.  Add ACE/ARB post procedure as long as renal function stable. Mixed hyperlipidemia: Check lipid panel.  Treat  with high intensity statin. CKD stage IIIb: Hydrate tonight, minimize nephrotoxic drugs.  Patient to have medicine reconciliation done, probably needs to get off of nonsteroidal anti-inflammatory drugs.   Risk Assessment/Risk Scores:  TIMI Risk Score for Unstable Angina or Non-ST Elevation MI:   The patient's TIMI risk score is 5, which indicates a 26% risk of all cause mortality, new or recurrent myocardial infarction or need for urgent revascularization in the next 14 days.    Severity of Illness: The appropriate patient status for this patient is INPATIENT. Inpatient status is judged to be reasonable and necessary in order to provide the required intensity of service to ensure the patient's safety. The patient's presenting symptoms, physical exam findings, and initial radiographic and laboratory data in the context of their chronic comorbidities is felt to place them at high risk for further clinical deterioration. Furthermore, it is not anticipated that the patient will be medically stable for discharge from the hospital within 2 midnights of admission.  For questions or updates, please contact [place] Please consult [email or web address] for contact info under     Signed, Sherren Mocha, MD  01/17/2021 7:33 PM

## 2021-01-17 NOTE — ED Notes (Signed)
Changed BP to appropriate size. Pt states that she feels ok and  denis pain.

## 2021-01-17 NOTE — ED Notes (Signed)
[facility] called for transport. 

## 2021-01-17 NOTE — ED Notes (Signed)
Dr.Butler notified of troponin level

## 2021-01-17 NOTE — ED Notes (Signed)
Report given to [facility] 

## 2021-01-17 NOTE — ED Triage Notes (Signed)
Patient presents ambulatory c/o Monday night having bilateral arm/shoulder pain. States the pain went away then returned this morning. Patient describes it as if you've been carrying something heavy all day. Denies any injury. Equal grip strength and sensation to arms.

## 2021-01-17 NOTE — ED Provider Notes (Signed)
[place] EMERGENCY DEPT Provider Note   CSN: [id number] Arrival date & time: 01/17/21  1406     History Chief Complaint  Patient presents with   Arm Pain    Elizabeth FRANZONI is a 80 y.o. female.  Presents to ER with concern for bilateral arm/shoulder pain.  Patient reports symptoms started Monday night, noted pain in her shoulders that seem to extend to her arms.  Pain had gone away and then came back this morning and has been relatively constant throughout the day today.  Described as heaviness.  No weakness, no numbness.  No speech or vision change.  No difficulty walking.  No diplopia.  Pain is currently mild, described as aching sensation.  No chest pain or difficulty in breathing.  History of diabetes, hyperlipidemia, hypertension.  Denies cardiac history.  HPI     Past Medical History:  Diagnosis Date   Anxiety    Diabetes mellitus without complication ([facility])    GERD (gastroesophageal reflux disease)    Hypercholesteremia    Hypertension     Patient Active Problem List   Diagnosis Date Noted   Herniated lumbar intervertebral disc 05/09/2015   Lumbar disc prolapse with compression radiculopathy 05/07/2015   Diabetes mellitus without complication ([place]) 38/45/3646   Hypertension 05/07/2015   Hypercholesteremia 05/07/2015   Herniated nucleus pulposus, L3-4 right     Past Surgical History:  Procedure Laterality Date   ABDOMINAL HYSTERECTOMY     BREAST EXCISIONAL BIOPSY Left years ago   negative   COLON SURGERY     COLONOSCOPY WITH PROPOFOL N/A 09/04/2016   Procedure: COLONOSCOPY WITH PROPOFOL;  Surgeon: Manya Silvas, MD;  Location: [facility];  Service: Endoscopy;  Laterality: N/A;   LUMBAR LAMINECTOMY/ DECOMPRESSION WITH MET-RX Right 05/09/2015   Procedure: Far Lateral Lumbar Three-Four Microdiscectomy WITH MET-RX;  Surgeon: Erline Levine, MD;  Location: [place] [facility];  Service: Neurosurgery;  Laterality: Right;     OB History   No obstetric  history on file.     Family History  Problem Relation Age of Onset   Breast cancer Neg Hx     Social History   Tobacco Use   Smoking status: Never   Smokeless tobacco: Never  Substance Use Topics   Alcohol use: No   Drug use: No    Home Medications Prior to Admission medications   Medication Sig Start Date End Date Taking? Authorizing Provider  acetaminophen (TYLENOL) 500 MG tablet Take 1,000 mg by mouth every 6 (six) hours as needed (pain).    [provider]  aspirin EC 81 MG tablet Take 81 mg by mouth at bedtime.    [provider]  atenolol-chlorthalidone (TENORETIC) 50-25 MG tablet Take 1 tablet by mouth daily.    [provider]  CALCIUM PO Take 1 tablet by mouth daily.    [provider]  dapagliflozin propanediol (FARXIGA) 5 MG TABS tablet Take 5 mg by mouth daily.    [provider]  diphenhydramine-acetaminophen (TYLENOL PM) 25-500 MG TABS tablet Take 1 tablet by mouth at bedtime as needed (SLEEP).     [provider]  HYDROcodone-acetaminophen (NORCO/VICODIN) 5-325 MG tablet Take 1 tablet by mouth every 6 (six) hours as needed. 04/22/17   Horton, Barbette Hair, MD  MELATONIN PO Take 1 tablet by mouth at bedtime.    [provider]  meloxicam (MOBIC) 7.5 MG tablet Take 1 tablet (7.5 mg total) by mouth daily. 05/06/15   Sable Feil, PA-C  Menthol,  Topical Analgesic, (BIOFREEZE EX) Apply 1 application topically 2 (two) times daily as needed (pain).    [provider]  metFORMIN (GLUCOPHAGE-XR) 750 MG 24 hr tablet Take 750 mg by mouth 2 (two) times daily. Morning and bedtime    [provider]  methocarbamol (ROBAXIN) 500 MG tablet Take 1 tablet (500 mg total) by mouth 2 (two) times daily. 12/07/19   Jacqlyn Larsen, PA-C  methylPREDNISolone (MEDROL DOSEPAK) 4 MG TBPK tablet Take as directed 12/07/19   Jacqlyn Larsen, PA-C  naproxen sodium (ALEVE) 220 MG tablet Take 440 mg by mouth daily as needed  (pain).    [provider]  rosuvastatin (CRESTOR) 40 MG tablet Take 40 mg by mouth daily.    [provider]  sulfamethoxazole-trimethoprim (BACTRIM DS,SEPTRA DS) 800-160 MG tablet Take 1 tablet by mouth 2 (two) times daily.    [provider]  venlafaxine XR (EFFEXOR-XR) 150 MG 24 hr capsule Take 150 mg by mouth daily.    [provider]    Allergies    Patient has no known allergies.  Review of Systems   Review of Systems  Constitutional:  Negative for chills and fever.  HENT:  Negative for ear pain and sore throat.   Eyes:  Negative for pain and visual disturbance.  Respiratory:  Negative for cough and shortness of breath.   Cardiovascular:  Negative for chest pain and palpitations.  Gastrointestinal:  Negative for abdominal pain and vomiting.  Genitourinary:  Negative for dysuria and hematuria.  Musculoskeletal:  Positive for arthralgias. Negative for back pain.  Skin:  Negative for color change and rash.  Neurological:  Negative for seizures and syncope.  All other systems reviewed and are negative.  Physical Exam Updated Vital Signs BP (!) 173/68 (BP Location: Left Arm)   Pulse 75   Temp 97.8 F (36.6 C) (Oral)   Resp 17   Ht 5' (1.524 m)   Wt 56.7 kg   SpO2 99%   BMI 24.41 kg/m   Physical Exam Vitals and nursing note reviewed.  Constitutional:      General: She is not in acute distress.    Appearance: She is well-developed.  HENT:     Head: Normocephalic and atraumatic.  Eyes:     Conjunctiva/sclera: Conjunctivae normal.  Cardiovascular:     Rate and Rhythm: Normal rate and regular rhythm.     Heart sounds: No murmur heard. Pulmonary:     Effort: Pulmonary effort is normal. No respiratory distress.     Breath sounds: Normal breath sounds.  Abdominal:     Palpations: Abdomen is soft.     Tenderness: There is no abdominal tenderness.  Musculoskeletal:     Cervical back: Neck supple.     Comments: RUE: No tenderness to  palpation throughout extremity, normal joint ROM, normal radial pulse LUE: No tenderness to palpation throughout extremity, normal joint ROM, normal radial pulse  Skin:    General: Skin is warm and dry.  Neurological:     Mental Status: She is alert.     Comments: 5 out of 5 strength throughout upper and lower extremities, sensation to light touch intact throughout upper and lower extremities     ED Results / Procedures / Treatments   Labs (all labs ordered are listed, but only abnormal results are displayed) Labs Reviewed  CBC WITH DIFFERENTIAL/PLATELET  BASIC METABOLIC PANEL  SEDIMENTATION RATE  TROPONIN I (HIGH SENSITIVITY)    EKG None  Radiology DG Chest 2  View  Result Date: 01/17/2021 CLINICAL DATA:  Chest pain EXAM: CHEST - 2 VIEW COMPARISON:  September 24, 2018 FINDINGS: Lungs are clear. Heart is upper normal in size with pulmonary vascularity normal. No adenopathy. No pneumothorax. There is degenerative change in the thoracic spine. IMPRESSION: Lungs clear.  Heart upper normal in size. Electronically Signed   By: Lowella Grip III M.D.   On: 01/17/2021 15:04    Procedures Procedures   Medications Ordered in ED Medications - No data to display  ED Course  I have reviewed the triage vital signs and the nursing notes.  Pertinent labs & imaging results that were available during my care of the patient were reviewed by me and considered in my medical decision making (see chart for details).    MDM Rules/Calculators/A&P                          80 year old lady presents to ER with concern for bilateral shoulder/upper arm aching and heaviness.  On exam she appears well in no distress.  No focal neurologic deficits appreciated.  Given age, risk factors, will check EKG, troponin to evaluate for atypical cardiac presentation.  While awaiting work-up and reassessment, signed out to Dr. Melina Copa.  Please see his note for final plan and disposition.  Final Clinical Impression(s)  / ED Diagnoses Final diagnoses:  Bilateral arm pain    Rx / DC Orders ED Discharge Orders     None        Lucrezia Starch, MD 01/17/21 [phone]

## 2021-01-17 NOTE — ED Notes (Signed)
Patient transported to X-ray 

## 2021-01-17 NOTE — ED Provider Notes (Signed)
Signout from Dr. Roslynn Amble.  80 year old female here with bilateral arm and shoulder discomfort.  No weakness no numbness.  Plan is to follow-up on labs EKG chest x-ray to exclude cardiac cause. Physical Exam  BP 140/60 (BP Location: Right Arm)   Pulse 75   Temp 97.8 F (36.6 C) (Oral)   Resp 16   Ht 5' (1.524 m)   Wt 56.7 kg   SpO2 95%   BMI 24.41 kg/m   Physical Exam  ED Course/Procedures     Procedures  MDM  Patient initial troponin coming back at 1500.  Have ordered aspirin and heparin per pharmacy protocol.  I find the patient resting comfortably in bed, denies any chest pain.  States is having a little bit of arm discomfort.  Reviewed results with her.  She is agreeable to plan for cardiology consult and admission.  Discussed with Dr. Burt Knack cardiology.  He agrees with plan for aspirin and heparin and he will accept the patient to [facility] telemetry under cardiology service.       Hayden Rasmussen, MD 01/18/21 [phone]

## 2021-01-18 ENCOUNTER — Encounter (HOSPITAL_COMMUNITY): Payer: Self-pay | Admitting: Cardiovascular Disease

## 2021-01-18 ENCOUNTER — Inpatient Hospital Stay (HOSPITAL_COMMUNITY)
Admission: EM | Disposition: A | Discharge status: Home / Self Care | Payer: Self-pay | Source: Home / Self Care | Attending: Cardiovascular Disease

## 2021-01-18 DIAGNOSIS — I2510 Atherosclerotic heart disease of native coronary artery without angina pectoris: Secondary | ICD-10-CM

## 2021-01-18 DIAGNOSIS — I10 Essential (primary) hypertension: Secondary | ICD-10-CM

## 2021-01-18 DIAGNOSIS — E785 Hyperlipidemia, unspecified: Secondary | ICD-10-CM

## 2021-01-18 HISTORY — PX: LEFT HEART CATH AND CORONARY ANGIOGRAPHY: CATH118249

## 2021-01-18 HISTORY — PX: INTRAVASCULAR ULTRASOUND/IVUS: CATH118244

## 2021-01-18 HISTORY — PX: CORONARY STENT INTERVENTION: CATH118234

## 2021-01-18 LAB — HEMOGLOBIN A1C
Hgb A1c MFr Bld: 6.5 % — ABNORMAL HIGH (ref 4.8–5.6)
Mean Plasma Glucose: 140 mg/dL

## 2021-01-18 LAB — BASIC METABOLIC PANEL
Anion gap: 8 (ref 5–15)
BUN: 33 mg/dL — ABNORMAL HIGH (ref 8–23)
CO2: 28 mmol/L (ref 22–32)
Calcium: 9.2 mg/dL (ref 8.9–10.3)
Chloride: 102 mmol/L (ref 98–111)
Creatinine, Ser: 1.59 mg/dL — ABNORMAL HIGH (ref 0.44–1.00)
GFR, Estimated: 33 mL/min — ABNORMAL LOW (ref >60)
Glucose, Bld: 126 mg/dL — ABNORMAL HIGH (ref 70–99)
Potassium: 4.2 mmol/L (ref 3.5–5.1)
Sodium: 138 mmol/L (ref 135–145)

## 2021-01-18 LAB — HEPARIN LEVEL (UNFRACTIONATED): Heparin Unfractionated: 0.51 IU/mL (ref 0.30–0.70)

## 2021-01-18 LAB — GLUCOSE, CAPILLARY
Glucose-Capillary: 124 mg/dL — ABNORMAL HIGH (ref 70–99)
Glucose-Capillary: 113 mg/dL — ABNORMAL HIGH (ref 70–99)
Glucose-Capillary: 132 mg/dL — ABNORMAL HIGH (ref 70–99)
Glucose-Capillary: 112 mg/dL — ABNORMAL HIGH (ref 70–99)

## 2021-01-18 LAB — POCT ACTIVATED CLOTTING TIME
Activated Clotting Time: 266 seconds
Activated Clotting Time: 323 seconds

## 2021-01-18 SURGERY — LEFT HEART CATH AND CORONARY ANGIOGRAPHY
Anesthesia: LOCAL

## 2021-01-18 MED ORDER — ASPIRIN 81 MG PO CHEW
81.0000 mg | CHEWABLE_TABLET | Freq: Every day | ORAL | Status: DC
  Administered 2021-01-19: 81 mg via ORAL
  Filled 2021-01-18: qty 1

## 2021-01-18 MED ORDER — LIDOCAINE HCL (PF) 1 % IJ SOLN
INTRAMUSCULAR | Status: DC | PRN
  Administered 2021-01-18: 10 mL via INTRA_ARTERIAL

## 2021-01-18 MED ORDER — HEPARIN SODIUM (PORCINE) 1000 UNIT/ML IJ SOLN
INTRAMUSCULAR | Status: AC
  Filled 2021-01-18: qty 1

## 2021-01-18 MED ORDER — TICAGRELOR 90 MG PO TABS
ORAL_TABLET | ORAL | Status: DC | PRN
  Administered 2021-01-18: 180 mg via ORAL

## 2021-01-18 MED ORDER — HYDRALAZINE HCL 20 MG/ML IJ SOLN
INTRAMUSCULAR | Status: AC
  Filled 2021-01-18: qty 1

## 2021-01-18 MED ORDER — ATORVASTATIN CALCIUM 10 MG PO TABS: 20.0000 mg | ORAL_TABLET | Freq: Every day | ORAL | Status: DC

## 2021-01-18 MED ORDER — LIDOCAINE HCL (PF) 1 % IJ SOLN
INTRAMUSCULAR | Status: AC
  Filled 2021-01-18: qty 30

## 2021-01-18 MED ORDER — NITROGLYCERIN 1 MG/10 ML FOR IR/CATH LAB
INTRA_ARTERIAL | Status: AC
  Filled 2021-01-18: qty 10

## 2021-01-18 MED ORDER — SODIUM CHLORIDE 0.9% FLUSH
3.0000 mL | Freq: Two times a day (BID) | INTRAVENOUS | Status: DC
  Administered 2021-01-18: 3 mL via INTRAVENOUS

## 2021-01-18 MED ORDER — MIDAZOLAM HCL 2 MG/2ML IJ SOLN
INTRAMUSCULAR | Status: AC
  Filled 2021-01-18: qty 2

## 2021-01-18 MED ORDER — HEPARIN (PORCINE) IN NACL 1000-0.9 UT/500ML-% IV SOLN
INTRAVENOUS | Status: AC
  Filled 2021-01-18: qty 500

## 2021-01-18 MED ORDER — ACETAMINOPHEN 325 MG PO TABS
ORAL_TABLET | ORAL | Status: AC
  Filled 2021-01-18: qty 2

## 2021-01-18 MED ORDER — IOHEXOL 350 MG/ML SOLN
INTRAVENOUS | Status: DC | PRN
  Administered 2021-01-18: 95 mL via INTRA_ARTERIAL

## 2021-01-18 MED ORDER — ASPIRIN EC 81 MG PO TBEC: 81.0000 mg | DELAYED_RELEASE_TABLET | Freq: Every day | ORAL | Status: DC

## 2021-01-18 MED ORDER — FENTANYL CITRATE (PF) 100 MCG/2ML IJ SOLN
INTRAMUSCULAR | Status: DC | PRN
  Administered 2021-01-18: 25 ug via INTRAVENOUS

## 2021-01-18 MED ORDER — TICAGRELOR 90 MG PO TABS
90.0000 mg | ORAL_TABLET | Freq: Two times a day (BID) | ORAL | Status: DC
  Administered 2021-01-18 – 2021-01-19 (×2): 90 mg via ORAL
  Filled 2021-01-18 (×2): qty 1

## 2021-01-18 MED ORDER — ASPIRIN 81 MG PO CHEW: 81.0000 mg | CHEWABLE_TABLET | Freq: Every day | ORAL | Status: DC

## 2021-01-18 MED ORDER — ACETAMINOPHEN 325 MG PO TABS
650.0000 mg | ORAL_TABLET | ORAL | Status: DC | PRN
  Administered 2021-01-18: 650 mg via ORAL

## 2021-01-18 MED ORDER — LIDOCAINE HCL (PF) 1 % IJ SOLN
INTRAMUSCULAR | Status: DC | PRN
  Administered 2021-01-18: 5 mL via SUBCUTANEOUS

## 2021-01-18 MED ORDER — ONDANSETRON HCL 4 MG/2ML IJ SOLN
4.0000 mg | Freq: Four times a day (QID) | INTRAMUSCULAR | Status: DC | PRN
  Administered 2021-01-18: 4 mg via INTRAVENOUS
  Filled 2021-01-18: qty 2

## 2021-01-18 MED ORDER — MIDAZOLAM HCL 2 MG/2ML IJ SOLN
INTRAMUSCULAR | Status: DC | PRN
Start: 2021-01-18 — End: 2021-01-18
  Administered 2021-01-18: 1 mg via INTRAVENOUS

## 2021-01-18 MED ORDER — SODIUM CHLORIDE 0.9 % IV SOLN: 250.0000 mL | INTRAVENOUS | Status: DC | PRN

## 2021-01-18 MED ORDER — VERAPAMIL HCL 2.5 MG/ML IV SOLN
INTRAVENOUS | Status: AC
  Filled 2021-01-18: qty 2

## 2021-01-18 MED ORDER — FENTANYL CITRATE (PF) 100 MCG/2ML IJ SOLN
INTRAMUSCULAR | Status: AC
  Filled 2021-01-18: qty 2

## 2021-01-18 MED ORDER — SODIUM CHLORIDE 0.9 % IV SOLN: INTRAVENOUS | Status: AC

## 2021-01-18 MED ORDER — SODIUM CHLORIDE 0.9% FLUSH: 3.0000 mL | INTRAVENOUS | Status: DC | PRN

## 2021-01-18 MED ORDER — AMLODIPINE BESYLATE 5 MG PO TABS
5.0000 mg | ORAL_TABLET | Freq: Every day | ORAL | Status: DC
  Administered 2021-01-18 – 2021-01-19 (×2): 5 mg via ORAL
  Filled 2021-01-18 (×2): qty 1

## 2021-01-18 MED ORDER — HYDRALAZINE HCL 20 MG/ML IJ SOLN
10.0000 mg | INTRAMUSCULAR | Status: AC | PRN
  Administered 2021-01-18: 10 mg via INTRAVENOUS

## 2021-01-18 MED ORDER — TICAGRELOR 90 MG PO TABS
ORAL_TABLET | ORAL | Status: AC
  Filled 2021-01-18: qty 2

## 2021-01-18 MED ORDER — HEPARIN SODIUM (PORCINE) 1000 UNIT/ML IJ SOLN
INTRAMUSCULAR | Status: DC | PRN
  Administered 2021-01-18 (×2): 3000 [IU] via INTRAVENOUS
  Administered 2021-01-18: 3500 [IU] via INTRAVENOUS

## 2021-01-18 MED ORDER — MORPHINE SULFATE (PF) 2 MG/ML IV SOLN
2.0000 mg | INTRAVENOUS | Status: DC | PRN
Start: 2021-01-18 — End: 2021-01-19

## 2021-01-18 MED ORDER — LABETALOL HCL 5 MG/ML IV SOLN: 10.0000 mg | INTRAVENOUS | Status: AC | PRN

## 2021-01-18 SURGICAL SUPPLY — 20 items
BALLN SAPPHIRE 2.0X12 (BALLOONS) ×2
BALLN SAPPHIRE ~~LOC~~ 2.75X15 (BALLOONS) ×2 IMPLANT
BALLN SAPPHIRE ~~LOC~~ 3.75X15 (BALLOONS) ×2 IMPLANT
BALLOON SAPPHIRE 2.0X12 (BALLOONS) ×1 IMPLANT
CATH INFINITI 5FR ANG PIGTAIL (CATHETERS) ×2 IMPLANT
CATH OPTICROSS HD (CATHETERS) ×2 IMPLANT
CATH OPTITORQUE TIG 4.0 5F (CATHETERS) ×2 IMPLANT
CATH VISTA GUIDE 6FR XBLAD3.5 (CATHETERS) ×2 IMPLANT
DEVICE RAD COMP TR BAND LRG (VASCULAR PRODUCTS) ×2 IMPLANT
GLIDESHEATH SLEND A-KIT 6F 22G (SHEATH) ×2 IMPLANT
KIT ENCORE 26 ADVANTAGE (KITS) ×2 IMPLANT
KIT HEART LEFT (KITS) ×2 IMPLANT
PACK CARDIAC CATHETERIZATION (CUSTOM PROCEDURE TRAY) ×2 IMPLANT
SLED PULL BACK IVUS (MISCELLANEOUS) ×2 IMPLANT
STENT ONYX FRONTIER 2.5X22 (Permanent Stent) ×2 IMPLANT
STENT ONYX FRONTIER 3.5X18 (Permanent Stent) ×2 IMPLANT
TRANSDUCER W/STOPCOCK (MISCELLANEOUS) ×2 IMPLANT
TUBING CIL FLEX 10 FLL-RA (TUBING) ×2 IMPLANT
WIRE ASAHI PROWATER 180CM (WIRE) ×2 IMPLANT
WIRE HI TORQ VERSACORE J 260CM (WIRE) ×2 IMPLANT

## 2021-01-18 NOTE — Progress Notes (Signed)
ANTICOAGULATION CONSULT NOTE Pharmacy Consult for Heparin Indication: chest pain/ACS  No Known Allergies  Patient Measurements: Height: 5' (152.4 cm) Weight: 56.7 kg (125 lb) IBW/kg (Calculated) : 45.5 Heparin Dosing Weight: 56.7 kg  Vital Signs: Temp: 98.4 F (36.9 C) (06/30 0003) Temp Source: Oral (06/30 0003) BP: 121/57 (06/30 0003) Pulse Rate: 78 (06/30 0003)  Labs: Recent Labs    01/17/21 1503 01/17/21 1634 01/18/21 0041  HGB 12.9  --   --   HCT 38.5  --   --   PLT 214  --   --   HEPARINUNFRC  --   --  0.51  CREATININE 1.69*  --   --   TROPONINIHS 1,502* 1,596*  --      Estimated Creatinine Clearance: 22 mL/min (A) (by C-G formula based on SCr of 1.69 mg/dL (H)).  Assessment: 80 y.o. female with chest pain for heparin   Goal of Therapy:  Heparin level 0.3-0.7 units/ml Monitor platelets by anticoagulation protocol: Yes   Plan:  Continue Heparin at current rate  Follow up after cath today   Phillis Knack, PharmD, BCPS

## 2021-01-18 NOTE — H&P (View-Only) (Signed)
Progress Note  Patient Name: Elizabeth Mcmillan Date of Encounter: 01/18/2021  [facility] Cardiologist: None   Subjective   No chest pain overnight, continues to have soreness in her back and shoulder blades.   Inpatient Medications    Scheduled Meds:  diphenhydrAMINE  25 mg Oral QHS   And   acetaminophen  500 mg Oral QHS   atenolol  50 mg Oral Daily   atorvastatin  80 mg Oral Daily   insulin aspart  0-15 Units Subcutaneous TID WC   sodium chloride flush  3 mL Intravenous Q12H   venlafaxine XR  225 mg Oral Daily   Continuous Infusions:  sodium chloride     sodium chloride 1 mL/kg/hr (01/17/21 2048)   heparin 700 Units/hr (01/17/21 1703)   PRN Meds: sodium chloride, sodium chloride flush   Vital Signs    Vitals:   01/18/21 0003 01/18/21 0306 01/18/21 0736 01/18/21 1007  BP: (!) 121/57 (!) 145/65 (!) 155/72 (!) 160/60  Pulse: 78 64 68 70  Resp: 17 17 16    Temp: 98.4 F (36.9 C) 97.8 F (36.6 C) 98 F (36.7 C)   TempSrc: Oral Oral Oral   SpO2: 98% 98% 100%   Weight:      Height:        Intake/Output Summary (Last 24 hours) at 01/18/2021 1008 Last data filed at 01/18/2021 0400 Gross per 24 hour  Intake 516.67 ml  Output --  Net 516.67 ml   Last 3 Weights 01/17/2021 01/17/2021 12/07/2019  Weight (lbs) 125 lb 125 lb 125 lb  Weight (kg) 56.7 kg 56.7 kg 56.7 kg      Telemetry    SR - Personally Reviewed  ECG    No new tracing  Physical Exam   GEN: No acute distress.   Neck: No JVD Cardiac: RRR, no murmurs, rubs, or gallops.  Respiratory: Clear to auscultation bilaterally. GI: Soft, nontender, non-distended  MS: No edema; No deformity. Neuro:  Nonfocal  Psych: Normal affect   Labs    High Sensitivity Troponin:   Recent Labs  Lab 01/17/21 1503 01/17/21 1634  TROPONINIHS 1,502* 1,596*      Chemistry Recent Labs  Lab 01/17/21 1503 01/18/21 0041  NA 138 138  K 3.3* 4.2  CL 98 102  CO2 25 28  GLUCOSE 160* 126*  BUN 36* 33*   CREATININE 1.69* 1.59*  CALCIUM 9.7 9.2  GFRNONAA 31* 33*  ANIONGAP 15 8     Hematology Recent Labs  Lab 01/17/21 1503  WBC 10.2  RBC 3.90  HGB 12.9  HCT 38.5  MCV 98.7  MCH 33.1  MCHC 33.5  RDW 13.0  PLT 214    BNPNo results for input(s): BNP, PROBNP in the last 168 hours.   DDimer No results for input(s): DDIMER in the last 168 hours.   Radiology    DG Chest 2 View  Result Date: 01/17/2021 CLINICAL DATA:  Chest pain EXAM: CHEST - 2 VIEW COMPARISON:  September 24, 2018 FINDINGS: Lungs are clear. Heart is upper normal in size with pulmonary vascularity normal. No adenopathy. No pneumothorax. There is degenerative change in the thoracic spine. IMPRESSION: Lungs clear.  Heart upper normal in size. Electronically Signed   By: Lowella Grip III M.D.   On: 01/17/2021 15:04    Cardiac Studies   N/a   Patient Profile     80 y.o. female with type 2 diabetes, hypertension, and mixed hyperlipidemia who is being seen 01/17/2021 for the  evaluation of non-STEMI.  Assessment & Plan    NSTEMI: hsTn peaked at 1596. Remains on IV heparin with plans for cardiac cath today. No chest pain overnight, still with soreness in her back/shoulder blades "like she's been kicked".  -- on ASA, statin and BB -- echo pending  HTN: blood pressures remain elevated this morning.  -- continue on atenolol 50mg  daily, chlorthalidone held with plans for cath -- add Norvasc 5 mg daily  HLD: on Lipitor 20mg  daily PTA -- increased to 80mg  daily -- check lipids in am  DM: Hgb A1c pending -- SSI  CKD stage III: Cr 1.69>>1.59 this morning -- chlorthalidone held  For questions or updates, please contact [facility] Please consult [email or web address] for contact info under        Signed, Reino Bellis, NP  01/18/2021, 10:08 AM    Patient seen, examined. Available data reviewed. Agree with findings, assessment, and plan as outlined by Reino Bellis, NP.  The patient is independently interviewed  and examined.  She is alert, oriented, no distress.  Lungs are clear, heart is regular rate and rhythm with no murmur gallop, abdomen is soft and nontender, extremities have no edema, skin is warm and dry with no rash.  The patient denies any recurrent chest pain overnight on IV heparin.  Plans again reviewed for cardiac catheterization and possible PCI today.  Renal function is stable with creatinine trending down from 1.69-1.59 with IV fluids overnight.  Sherren Mocha, M.D. 01/18/2021 11:06 AM

## 2021-01-18 NOTE — Interval H&P Note (Signed)
Cath Lab Visit (complete for each Cath Lab visit)  Clinical Evaluation Leading to the Procedure:   ACS: Yes.    Non-ACS:    Anginal Classification: CCS II  Anti-ischemic medical therapy: Minimal Therapy (1 class of medications)  Non-Invasive Test Results: No non-invasive testing performed  Prior CABG: No previous CABG      History and Physical Interval Note:  01/18/2021 1:28 PM  Elizabeth Mcmillan  has presented today for surgery, with the diagnosis of NSTEMI.  The various methods of treatment have been discussed with the patient and family. After consideration of risks, benefits and other options for treatment, the patient has consented to  Procedure(s): LEFT HEART CATH AND CORONARY ANGIOGRAPHY (N/A) as a surgical intervention.  The patient's history has been reviewed, patient examined, no change in status, stable for surgery.  I have reviewed the patient's chart and labs.  Questions were answered to the patient's satisfaction.     Quay Burow

## 2021-01-18 NOTE — Progress Notes (Addendum)
Progress Note  Patient Name: Elizabeth Mcmillan Date of Encounter: 01/18/2021  [facility] Cardiologist: None   Subjective   No chest pain overnight, continues to have soreness in her back and shoulder blades.   Inpatient Medications    Scheduled Meds:  diphenhydrAMINE  25 mg Oral QHS   And   acetaminophen  500 mg Oral QHS   atenolol  50 mg Oral Daily   atorvastatin  80 mg Oral Daily   insulin aspart  0-15 Units Subcutaneous TID WC   sodium chloride flush  3 mL Intravenous Q12H   venlafaxine XR  225 mg Oral Daily   Continuous Infusions:  sodium chloride     sodium chloride 1 mL/kg/hr (01/17/21 2048)   heparin 700 Units/hr (01/17/21 1703)   PRN Meds: sodium chloride, sodium chloride flush   Vital Signs    Vitals:   01/18/21 0003 01/18/21 0306 01/18/21 0736 01/18/21 1007  BP: (!) 121/57 (!) 145/65 (!) 155/72 (!) 160/60  Pulse: 78 64 68 70  Resp: 17 17 16    Temp: 98.4 F (36.9 C) 97.8 F (36.6 C) 98 F (36.7 C)   TempSrc: Oral Oral Oral   SpO2: 98% 98% 100%   Weight:      Height:        Intake/Output Summary (Last 24 hours) at 01/18/2021 1008 Last data filed at 01/18/2021 0400 Gross per 24 hour  Intake 516.67 ml  Output --  Net 516.67 ml   Last 3 Weights 01/17/2021 01/17/2021 12/07/2019  Weight (lbs) 125 lb 125 lb 125 lb  Weight (kg) 56.7 kg 56.7 kg 56.7 kg      Telemetry    SR - Personally Reviewed  ECG    No new tracing  Physical Exam   GEN: No acute distress.   Neck: No JVD Cardiac: RRR, no murmurs, rubs, or gallops.  Respiratory: Clear to auscultation bilaterally. GI: Soft, nontender, non-distended  MS: No edema; No deformity. Neuro:  Nonfocal  Psych: Normal affect   Labs    High Sensitivity Troponin:   Recent Labs  Lab 01/17/21 1503 01/17/21 1634  TROPONINIHS 1,502* 1,596*      Chemistry Recent Labs  Lab 01/17/21 1503 01/18/21 0041  NA 138 138  K 3.3* 4.2  CL 98 102  CO2 25 28  GLUCOSE 160* 126*  BUN 36* 33*   CREATININE 1.69* 1.59*  CALCIUM 9.7 9.2  GFRNONAA 31* 33*  ANIONGAP 15 8     Hematology Recent Labs  Lab 01/17/21 1503  WBC 10.2  RBC 3.90  HGB 12.9  HCT 38.5  MCV 98.7  MCH 33.1  MCHC 33.5  RDW 13.0  PLT 214    BNPNo results for input(s): BNP, PROBNP in the last 168 hours.   DDimer No results for input(s): DDIMER in the last 168 hours.   Radiology    DG Chest 2 View  Result Date: 01/17/2021 CLINICAL DATA:  Chest pain EXAM: CHEST - 2 VIEW COMPARISON:  September 24, 2018 FINDINGS: Lungs are clear. Heart is upper normal in size with pulmonary vascularity normal. No adenopathy. No pneumothorax. There is degenerative change in the thoracic spine. IMPRESSION: Lungs clear.  Heart upper normal in size. Electronically Signed   By: Lowella Grip III M.D.   On: 01/17/2021 15:04    Cardiac Studies   N/a   Patient Profile     80 y.o. female with type 2 diabetes, hypertension, and mixed hyperlipidemia who is being seen 01/17/2021 for the  evaluation of non-STEMI.  Assessment & Plan    NSTEMI: hsTn peaked at 1596. Remains on IV heparin with plans for cardiac cath today. No chest pain overnight, still with soreness in her back/shoulder blades "like she's been kicked".  -- on ASA, statin and BB -- echo pending  HTN: blood pressures remain elevated this morning.  -- continue on atenolol 50mg  daily, chlorthalidone held with plans for cath -- add Norvasc 5 mg daily  HLD: on Lipitor 20mg  daily PTA -- increased to 80mg  daily -- check lipids in am  DM: Hgb A1c pending -- SSI  CKD stage III: Cr 1.69>>1.59 this morning -- chlorthalidone held  For questions or updates, please contact [facility] Please consult [email or web address] for contact info under        Signed, Reino Bellis, NP  01/18/2021, 10:08 AM    Patient seen, examined. Available data reviewed. Agree with findings, assessment, and plan as outlined by Reino Bellis, NP.  The patient is independently interviewed  and examined.  She is alert, oriented, no distress.  Lungs are clear, heart is regular rate and rhythm with no murmur gallop, abdomen is soft and nontender, extremities have no edema, skin is warm and dry with no rash.  The patient denies any recurrent chest pain overnight on IV heparin.  Plans again reviewed for cardiac catheterization and possible PCI today.  Renal function is stable with creatinine trending down from 1.69-1.59 with IV fluids overnight.  Sherren Mocha, M.D. 01/18/2021 11:06 AM

## 2021-01-19 ENCOUNTER — Other Ambulatory Visit (HOSPITAL_COMMUNITY): Payer: Self-pay

## 2021-01-19 ENCOUNTER — Encounter (HOSPITAL_COMMUNITY): Payer: Self-pay | Admitting: Cardiovascular Disease

## 2021-01-19 ENCOUNTER — Inpatient Hospital Stay (HOSPITAL_COMMUNITY): Payer: Medicare Other

## 2021-01-19 DIAGNOSIS — I214 Non-ST elevation (NSTEMI) myocardial infarction: Secondary | ICD-10-CM

## 2021-01-19 LAB — BASIC METABOLIC PANEL
Anion gap: 8 (ref 5–15)
BUN: 20 mg/dL (ref 8–23)
CO2: 25 mmol/L (ref 22–32)
Calcium: 8.6 mg/dL — ABNORMAL LOW (ref 8.9–10.3)
Chloride: 106 mmol/L (ref 98–111)
Creatinine, Ser: 1.13 mg/dL — ABNORMAL HIGH (ref 0.44–1.00)
GFR, Estimated: 50 mL/min — ABNORMAL LOW (ref >60)
Glucose, Bld: 125 mg/dL — ABNORMAL HIGH (ref 70–99)
Potassium: 3.3 mmol/L — ABNORMAL LOW (ref 3.5–5.1)
Sodium: 139 mmol/L (ref 135–145)

## 2021-01-19 LAB — ECHOCARDIOGRAM COMPLETE
AR max vel: 2.71 cm2
AV Area VTI: 2.77 cm2
AV Area mean vel: 2.68 cm2
AV Mean grad: 4.5 mmHg
AV Peak grad: 7.6 mmHg
Ao pk vel: 1.38 m/s
Area-P 1/2: 3.31 cm2
Height: 60 in
S' Lateral: 2.7 cm
Weight: 2038.81 oz

## 2021-01-19 LAB — CBC
HCT: 34.3 % — ABNORMAL LOW (ref 36.0–46.0)
Hemoglobin: 11.5 g/dL — ABNORMAL LOW (ref 12.0–15.0)
MCH: 33 pg (ref 26.0–34.0)
MCHC: 33.5 g/dL (ref 30.0–36.0)
MCV: 98.6 fL (ref 80.0–100.0)
Platelets: 176 10*3/uL (ref 150–400)
RBC: 3.48 MIL/uL — ABNORMAL LOW (ref 3.87–5.11)
RDW: 13 % (ref 11.5–15.5)
WBC: 7.4 10*3/uL (ref 4.0–10.5)
nRBC: 0 % (ref 0.0–0.2)

## 2021-01-19 LAB — LIPID PANEL
Cholesterol: 205 mg/dL — ABNORMAL HIGH (ref 0–200)
HDL: 28 mg/dL — ABNORMAL LOW (ref >40)
LDL Cholesterol: UNDETERMINED mg/dL (ref 0–99)
Total CHOL/HDL Ratio: 7.3 RATIO
Triglycerides: 401 mg/dL — ABNORMAL HIGH (ref <150)
VLDL: UNDETERMINED mg/dL (ref 0–40)

## 2021-01-19 LAB — GLUCOSE, CAPILLARY: Glucose-Capillary: 134 mg/dL — ABNORMAL HIGH (ref 70–99)

## 2021-01-19 LAB — LDL CHOLESTEROL, DIRECT: Direct LDL: 114.2 mg/dL — ABNORMAL HIGH (ref 0–99)

## 2021-01-19 MED ORDER — CARVEDILOL 6.25 MG PO TABS
6.2500 mg | ORAL_TABLET | Freq: Two times a day (BID) | ORAL | 0 refills | Status: DC
  Filled 2021-01-19: qty 180, 90d supply, fill #0

## 2021-01-19 MED ORDER — CARVEDILOL 6.25 MG PO TABS
6.2500 mg | ORAL_TABLET | Freq: Two times a day (BID) | ORAL | Status: DC
  Administered 2021-01-19: 6.25 mg via ORAL
  Filled 2021-01-19: qty 1

## 2021-01-19 MED ORDER — PERFLUTREN LIPID MICROSPHERE
1.0000 mL | INTRAVENOUS | Status: AC | PRN
  Administered 2021-01-19: 5 mL via INTRAVENOUS
  Filled 2021-01-19: qty 10

## 2021-01-19 MED ORDER — ATORVASTATIN CALCIUM 80 MG PO TABS
80.0000 mg | ORAL_TABLET | Freq: Every day | ORAL | 1 refills | Status: DC
  Filled 2021-01-19: qty 90, 90d supply, fill #0

## 2021-01-19 MED ORDER — TICAGRELOR 90 MG PO TABS
90.0000 mg | ORAL_TABLET | Freq: Two times a day (BID) | ORAL | 2 refills | Status: DC
  Filled 2021-01-19: qty 60, 30d supply, fill #0

## 2021-01-19 MED ORDER — POTASSIUM CHLORIDE CRYS ER 20 MEQ PO TBCR
40.0000 meq | EXTENDED_RELEASE_TABLET | Freq: Once | ORAL | Status: AC
  Administered 2021-01-19: 40 meq via ORAL
  Filled 2021-01-19: qty 2

## 2021-01-19 MED ORDER — NITROGLYCERIN 0.4 MG SL SUBL
0.4000 mg | SUBLINGUAL_TABLET | SUBLINGUAL | 2 refills | Status: AC | PRN
  Filled 2021-01-19: qty 25, 8d supply, fill #0

## 2021-01-19 MED ORDER — CARVEDILOL 6.25 MG PO TABS: 6.2500 mg | ORAL_TABLET | Freq: Two times a day (BID) | ORAL | Status: DC

## 2021-01-19 MED ORDER — AMLODIPINE BESYLATE 5 MG PO TABS
5.0000 mg | ORAL_TABLET | Freq: Every day | ORAL | 0 refills | Status: DC
  Filled 2021-01-19: qty 90, 90d supply, fill #0

## 2021-01-19 MED FILL — Heparin Sod (Porcine)-NaCl IV Soln 1000 Unit/500ML-0.9%: INTRAVENOUS | Qty: 1000 | Status: AC

## 2021-01-19 NOTE — Care Management Important Message (Signed)
Important Message  Patient Details  Name: Elizabeth Mcmillan MRN: [id number] Date of Birth: 1941-07-06   Medicare Important Message Given:  Yes   Patient left prior to IM delivery.  Will mailed the document to the patients home address.    Meilani Edmundson 01/19/2021, 2:44 PM

## 2021-01-19 NOTE — Discharge Summary (Addendum)
Discharge Summary    Patient ID: Elizabeth Mcmillan MRN: [id number]; DOB: 11/16/1940  Admit date: 01/17/2021 Discharge date: 01/19/2021  PCP:  Renee Rival, NP   [facility] Providers Cardiologist:  Sherren Mocha, MD   {  Discharge Diagnoses    Principal Problem:   NSTEMI (non-ST elevated myocardial infarction) [facility]) Active Problems:   Diabetes mellitus without complication [facility])   Hypertension   Hypercholesteremia    Diagnostic Studies/Procedures    Cath: 01/18/21  Ost LAD to Prox LAD lesion is 50% stenosed. Mid LAD lesion is 95% stenosed. A drug-eluting stent was successfully placed using a STENT ONYX FRONTIER 2.5X22. Post intervention, there is a 0% residual stenosis. A drug-eluting stent was successfully placed using a STENT ONYX FRONTIER 3.5X18. Post intervention, there is a 0% residual stenosis.   IMPRESSION: Elizabeth Mcmillan had successful IVUS guided mid and proximal LAD PCI and drug-eluting stenting.  She will need to be on DAPT for 12 months uninterrupted (low-dose aspirin and Brilinta).  I used 95 cc of contrast with a max GFR of 99 cc.  She will need to be hydrated with close attention to her renal function.  A 2D echo has been ordered.  Dr. Burt Knack was notified of these results.     Quay Burow. MD, [facility] 01/18/2021 3:07 PM  Diagnostic Dominance: Right    Intervention    _____________   History of Present Illness     Elizabeth Mcmillan is a 80 y.o. female with  type 2 diabetes, hypertension, and mixed hyperlipidemia who presented with chest pain and found to have a NSTEMI.   Elizabeth Mcmillan complained of aching in both arms that first occurred Monday prior to admission and lasted for several hours and then recurred the day of. She also had pain in her left shoulder and pain in her 'ribs.' She has a history of arthritis but these symptoms were different than what she has experienced in the past. There was associated diaphoresis and shortness of breath, no  nausea or vomiting. She has had no recurrent arm pain since IV heparin was started but still complains of rib soreness when she moves certain ways.   She has had Type II DM for many years but has come off of oral hypoglycemics because of good glycemic control. She was also treated for HTN and dyslipidemia.  She is not aware of having chronic kidney disease but her creatinine has been mildly elevated for the past few years based on review of her records.   The patient has no personal history of coronary artery disease.  Her father had a lot of problems with coronary disease and ultimately died of a myocardial infarction at age 38.  The patient is a lifelong non-smoker but has other cardiac risk factors outlined above with type 2 diabetes, hypertension, and dyslipidemia.   In the ED her labs showed hsTn 1596, Na + 138, K+ 3.3, Cr 1.69, WBC 10.2. EKG showed SR with ST/T wave changes concerning for anterior ischemia. She was admitted to cardiology and planned for cardiac cath.     Hospital Course    NSTEMI: hsTn peaked at 1596. Underwent cardiac cath noted above with ostial LAD 50% and mLAD 95% lesion treated with PCI/DES x2. Placed on DAPT with ASA/Brilinta for at least one year. No recurrent chest pain with ambulation, seen by CR. Follow up echo completed and reviewed by MD prior to discharge. EF felt to be around 55%, formal read pending at discharge.  HTN: blood pressures remain elevated. Switched from atenolol to coreg 6.23m BID with the addition of norvasc 517mdaily. -- stopped chlorthalidone at discharge    HLD: on Lipitor 2064maily PTA on med list. Reports she was not taking.  -- increased Lipitor to 16m66mily -- Trig 401, unable to calculate LDL, HDL 28 -- consider starting Vascepa at outpatient visit if deductible meet and financially feasible.    DM: Hgb A1c 6.5 ( has been diet controlled) -- dicussed diet/ exercise modifications -- consider SGLT2 at follow up   CKD stage III:  Cr 1.69>>1.59>>1.13 on discharge -- chlorthalidone held at DC  Hypokalemia: K+ 3.3, supplemented prior to discharge.  General: Well developed, well nourished, female appearing in no acute distress. Head: Normocephalic, atraumatic.  Neck: Supple without bruits, JVD. Lungs:  Resp regular and unlabored, CTA. Heart: RRR, S1, S2, no S3, S4, or murmur; no rub. Abdomen: Soft, non-tender, non-distended with normoactive bowel sounds. No hepatomegaly. No rebound/guarding. No obvious abdominal masses. Extremities: No clubbing, cyanosis, edema. Distal pedal pulses are 2+ bilaterally. Right radial cath site stable without bruising or hematoma Neuro: Alert and oriented X 3. Moves all extremities spontaneously. Psych: Normal affect.  Patient was seen by Dr. CoopBurt Knack deemed stable for discharge home. Follow up in the office has been arranged. [facility]. Educated by PharmD prior to discharge.   Did the patient have an acute coronary syndrome (MI, NSTEMI, STEMI, etc) this admission?:  Yes                               AHA/ACC Clinical Performance & Quality Measures: Aspirin prescribed? - Yes ADP Receptor Inhibitor (Plavix/Clopidogrel, Brilinta/Ticagrelor or Effient/Prasugrel) prescribed (includes medically managed patients)? - Yes Beta Blocker prescribed? - Yes High Intensity Statin (Lipitor 40-16mg57mCrestor 20-40mg)31mscribed? - Yes EF assessed during THIS hospitalization? - Yes For EF <40%, was ACEI/ARB prescribed? - Not Applicable (EF >/= 40%) F03%EF <40%, Aldosterone Antagonist (Spironolactone or Eplerenone) prescribed? - Not Applicable (EF >/= 40%) C21%iac Rehab Phase II ordered (including medically managed patients)? - Yes      _____________  Discharge Vitals Blood pressure (!) 170/69, pulse 82, temperature 99.3 F (37.4 C), temperature source Oral, resp. rate 18, height 5' (1.524 m), weight 57.8 kg, SpO2 99 %.  Filed Weights   01/17/21 1413 01/17/21 2014  01/18/21 1545  Weight: 56.7 kg 56.7 kg 57.8 kg    Labs & Radiologic Studies    CBC Recent Labs    01/17/21 1503 01/19/21 0308  WBC 10.2 7.4  NEUTROABS 8.5*  --   HGB 12.9 11.5*  HCT 38.5 34.3*  MCV 98.7 98.6  PLT 214 176   224ic Metabolic Panel Recent Labs    01/18/21 0041 01/19/21 0308  NA 138 139  K 4.2 3.3*  CL 102 106  CO2 28 25  GLUCOSE 126* 125*  BUN 33* 20  CREATININE 1.59* 1.13*  CALCIUM 9.2 8.6*   Liver Function Tests No results for input(s): AST, ALT, ALKPHOS, BILITOT, PROT, ALBUMIN in the last 72 hours. No results for input(s): LIPASE, AMYLASE in the last 72 hours. High Sensitivity Troponin:   Recent Labs  Lab 01/17/21 1503 01/17/21 1634  TROPONINIHS 1,502* 1,596*    BNP Invalid input(s): POCBNP D-Dimer No results for input(s): DDIMER in the last 72 hours. Hemoglobin A1C Recent Labs    01/18/21 0041  HGBA1C 6.5*   Fasting Lipid  Panel Recent Labs    01/19/21 0308  CHOL 205*  HDL 28*  LDLCALC UNABLE TO CALCULATE IF TRIGLYCERIDE OVER 400 mg/dL  TRIG 401*  CHOLHDL 7.3  LDLDIRECT 114.2*   Thyroid Function Tests No results for input(s): TSH, T4TOTAL, T3FREE, THYROIDAB in the last 72 hours.  Invalid input(s): FREET3 _____________  DG Chest 2 View  Result Date: 01/17/2021 CLINICAL DATA:  Chest pain EXAM: CHEST - 2 VIEW COMPARISON:  September 24, 2018 FINDINGS: Lungs are clear. Heart is upper normal in size with pulmonary vascularity normal. No adenopathy. No pneumothorax. There is degenerative change in the thoracic spine. IMPRESSION: Lungs clear.  Heart upper normal in size. Electronically Signed   By: Lowella Grip III M.D.   On: 01/17/2021 15:04   CARDIAC CATHETERIZATION  Result Date: 01/19/2021 Formatting of this result is different from the original. Images from the original result were not included.  Ost LAD to Prox LAD lesion is 50% stenosed.  Mid LAD lesion is 95% stenosed.  A drug-eluting stent was successfully placed using a  STENT ONYX FRONTIER 2.5X22.  Post intervention, there is a 0% residual stenosis.  A drug-eluting stent was successfully placed using a STENT ONYX FRONTIER 3.5X18.  Post intervention, there is a 0% residual stenosis.  Elizabeth Mcmillan is a 80 y.o. female  [id number] LOCATION:  FACILITY: [facility] PHYSICIAN: Quay Burow, M.D. 1940/11/29 DATE OF PROCEDURE:  01/18/2021 DATE OF DISCHARGE: CARDIAC CATHETERIZATION / IVUS Guided PCI DES mid and Prox LAD History obtained from chart review.80 y.o. female with type 2 diabetes, hypertension, and mixed hyperlipidemia who is being seen 01/17/2021 for the evaluation of non-STEMI. PROCEDURE DESCRIPTION: The patient was brought to the second floor [facility] cath lab in the postabsorptive state.  She was premedicated with IV Versed and fentanyl.  Her right wristwas prepped and shaved in usual sterile fashion. Xylocaine 1% was used for local anesthesia. A 6 French sheath was inserted into the right radial  artery using standard Seldinger technique. The patient received 4500 units  of heparin intravenously.  A 5 [place] TIG catheter and pigtail catheters were used for selective coronary angiography and obtain left heart pressures.  Isovue dye was used for the entirety of the case (95 cc total administered to patient).  Retrograde aortic, left ventricular and pullback pressures were recorded.  Radial cocktail was administered via the SideArm sheath. The patient received an additional 5000's of heparin (9500 units of heparin total) with an ACT peak at 323.  Using a 6 [place] XB LAD 3.5 cm guide catheter along with a 0.14 [place] guidewire and a 2 mm x 12 mm balloon the mid LAD lesion was crossed with little difficulty and predilated.  The patient did receive 180 mg of Brilinta prior to intervention.  I then chose a 2.5 x 22 mm long Medtronic frontier stent and deployed at 14 and 16 atm.  I postdilated with a 2.75 x 15 mm long noncompliant balloon at 16 atm (2.85 mm) resulting in  reduction of a 95% mid LAD stenosis to 0% residual.  I did perform intravascular ultrasound imaging revealing excellent apposition of the stent struts to the vessel wall. I also performed IVUS imaging of the proximal LAD revealing an extensive amount of atherosclerotic plaque with a minimal lumen area of 3.8 mm.  The vessel diameter just proximal to the diagonal branch was 3.8 mm and in the proximal LAD 4.3 mm.  Elected to primarily stent this with a 3.5 mm x 18 mm  long Medtronic frontier drug-eluting stent and postdilated with a 3.75 x 15 mm long noncompliant balloon up to 16 atm (3.85 mm) resulting reduction of a 50 to 60% hypodense proximal LAD stenosis to 0% residual.  The patient tolerated the procedure well.  The guidewire and catheter were removed.  The sheath was removed and a TR band was placed on the right wrist to achieve patent hemostasis.  The patient left the lab in stable condition.   Elizabeth Mcmillan had successful IVUS guided mid and proximal LAD PCI and drug-eluting stenting.  She will need to be on DAPT for 12 months uninterrupted (low-dose aspirin and Brilinta).  I used 95 cc of contrast with a max GFR of 99 cc.  She will need to be hydrated with close attention to her renal function.  A 2D echo has been ordered.  Dr. Burt Knack was notified of these results. Quay Burow. MD, [facility] 01/18/2021 3:07 PM   Disposition   Pt is being discharged home today in good condition.  Follow-up Plans & Appointments     Follow-up [facility] Follow up on 02/02/2021.   Specialty: Cardiology Why: _0 :30pm for your follow up appt with Emelia Loron NP Contact information: [address] [place] [address] [phone]               Discharge Instructions     Amb Referral to Cardiac Rehabilitation   Complete by: As directed    Diagnosis:  Coronary Stents NSTEMI     After initial evaluation and assessments  completed: Virtual Based Care may be provided alone or in conjunction with Phase 2 Cardiac Rehab based on patient barriers.: Yes   Call MD for:  difficulty breathing, headache or visual disturbances   Complete by: As directed    Call MD for:  redness, tenderness, or signs of infection (pain, swelling, redness, odor or green/yellow discharge around incision site)   Complete by: As directed    Diet - low sodium heart healthy   Complete by: As directed    Discharge instructions   Complete by: As directed    Radial Site Care Refer to this sheet in the next few weeks. These instructions provide you with information on caring for yourself after your procedure. Your caregiver may also give you more specific instructions. Your treatment has been planned according to current medical practices, but problems sometimes occur. Call your caregiver if you have any problems or questions after your procedure. HOME CARE INSTRUCTIONS You may shower the day after the procedure. Remove the bandage (dressing) and gently wash the site with plain soap and water. Gently pat the site dry.  Do not apply powder or lotion to the site.  Do not submerge the affected site in water for 3 to 5 days.  Inspect the site at least twice daily.  Do not flex or bend the affected arm for 24 hours.  No lifting over 5 pounds (2.3 kg) for 5 days after your procedure.  Do not drive home if you are discharged the same day of the procedure. Have someone else drive you.  You may drive 24 hours after the procedure unless otherwise instructed by your caregiver.  What to expect: Any bruising will usually fade within 1 to 2 weeks.  Blood that collects in the tissue (hematoma) may be painful to the touch. It should usually decrease in size and tenderness within 1 to 2 weeks.  SEEK IMMEDIATE MEDICAL CARE IF:  You have unusual pain at the radial site.  You have redness, warmth, swelling, or pain at the radial site.  You have drainage (other than  a small amount of blood on the dressing).  You have chills.  You have a fever or persistent symptoms for more than 72 hours.  You have a fever and your symptoms suddenly get worse.  Your arm becomes pale, cool, tingly, or numb.  You have heavy bleeding from the site. Hold pressure on the site.   PLEASE DO NOT MISS ANY DOSES OF YOUR BRILINTA!!!!! Also keep a log of you blood pressures and bring back to your follow up appt. Please call the office with any questions.   Patients taking blood thinners should generally stay away from medicines like ibuprofen, Advil, Motrin, naproxen, and Aleve due to risk of stomach bleeding. You may take Tylenol as directed or talk to your primary doctor about alternatives.  PLEASE ENSURE THAT YOU DO NOT RUN OUT OF YOUR BRILINTA. This medication is very important to remain on for at least one year. IF you have issues obtaining this medication due to cost please CALL the office 3-5 business days prior to running out in order to prevent missing doses of this medication.   Increase activity slowly   Complete by: As directed        Discharge Medications   Allergies as of 01/19/2021   No Known Allergies      Medication List     STOP taking these medications    atenolol-chlorthalidone 50-25 MG tablet Commonly known as: TENORETIC   HYDROcodone-acetaminophen 5-325 MG tablet Commonly known as: NORCO/VICODIN   meloxicam 7.5 MG tablet Commonly known as: Mobic   methocarbamol 500 MG tablet Commonly known as: ROBAXIN   methylPREDNISolone 4 MG Tbpk tablet Commonly known as: MEDROL DOSEPAK       TAKE these medications    acetaminophen 500 MG tablet Commonly known as: TYLENOL Take 1,000 mg by mouth every 6 (six) hours as needed (pain).   amLODipine 5 MG tablet Commonly known as: NORVASC Take 1 tablet (5 mg total) by mouth daily. Notes to patient: Lowers blood pressure  Decreases chest pain   aspirin EC 81 MG tablet Take 81 mg by mouth at  bedtime. Notes to patient: Prevents clotting in the stent and heart attack   atorvastatin 80 MG tablet Commonly known as: LIPITOR Take 1 tablet (80 mg total) by mouth daily. What changed:  medication strength how much to take Notes to patient: Lowers cholesterol and triglycerides   carvedilol 6.25 MG tablet Commonly known as: COREG Take 1 tablet (6.25 mg total) by mouth 2 (two) times daily with a meal. Notes to patient: Decreases work of the heart  Lowers blood pressure and heart rate   diphenhydramine-acetaminophen 25-500 MG Tabs tablet Commonly known as: TYLENOL PM Take 1 tablet by mouth at bedtime. Notes to patient: Sleeping aid   nitroGLYCERIN 0.4 MG SL tablet Commonly known as: Nitrostat Place 1 tablet (0.4 mg total) under the tongue every 5 (five) minutes as needed.   ticagrelor 90 MG Tabs tablet Commonly known as: BRILINTA Take 1 tablet (90 mg total) by mouth 2 (two) times daily. Notes to patient: Prevents clotting in the stent and heart attack   venlafaxine XR 150 MG 24 hr capsule Commonly known as: EFFEXOR-XR Take 150 mg by mouth daily. Notes to patient: Depression    venlafaxine XR 75 MG 24 hr capsule Commonly known as: EFFEXOR-XR Take 75 mg by mouth  daily. Notes to patient: Depression         Outstanding Labs/Studies   FLP/LFTs in 8 weeks  Duration of Discharge Encounter   Greater than 30 minutes including physician time.  Signed, Reino Bellis, NP 01/19/2021, 11:24 AM   Patient seen, examined. Available data reviewed. Agree with findings, assessment, and plan as outlined by Reino Bellis, NP.  The patient is independently interviewed and examined.  On my examination today, she is awake and alert, no distress.  Lungs are clear, heart is regular rate and rhythm with no murmur gallop, abdomen soft nontender, extremities have no edema, right radial site is clear.  I have personally reviewed the patient's cardiac catheterization images which show an  excellent result after stenting of the LAD yesterday.  The patient is clinically stable for discharge today.  We reviewed post PCI instructions.  I reviewed her medical regimen and the importance of adherence to dual antiplatelet therapy has been discussed.  The patient had an echocardiogram this morning and the formal interpretation is currently pending.  I did review the images and she appears to have a wall motion abnormality in the distal LAD territory involving the distal anteroseptum.  The overall EF by my interpretation is preserved in the range of about 55%.  We will review the formal interpretation after it is completed.  Hospital follow-up will be arranged.  She wishes to follow with me as her primary cardiologist.  Sherren Mocha, M.D. 01/19/2021 11:24 AM

## 2021-01-19 NOTE — TOC Benefit Eligibility Note (Signed)
Patient Teacher, English as a foreign language completed.    The patient is currently admitted and upon discharge could be taking Brilinta 90 mg.  The current 30 day co-pay is, $459.68 due to a $500.00 deductible remaining.   The patient is currently admitted and upon discharge could be taking Vascepa 1g.  The current 30 day co-pay is, $383.26 due to a $500.00 deductible remaining.   The patient is insured through [place], [place] Patient Advocate Specialist [place] Team Direct Number: [phone]  Fax: [phone]

## 2021-01-19 NOTE — Progress Notes (Signed)
CARDIAC REHAB PHASE I   PRE:  Rate/Rhythm: 82 SR  BP:  Supine: 154/65  Sitting:   Standing:    SaO2: 97%RA  MODE:  Ambulation: 700 ft   POST:  Rate/Rhythm: 106 ST  BP:  Supine: 156/58  Sitting:   Standing:    SaO2: 100%RA 0800-0905 Pt walked 700 ft on RA with steady gait and no CP. Tolerated well. MI education completed with pt who voiced understanding. Stressed importance of brilinta with stent. Reviewed NTG use, walking ofr ex, heart healthy and low carb food choices, MI restrictions, and CRP 2. Pt would like referral to [facility] [place] which I placed.    Graylon Good, RN BSN  01/19/2021 9:01 AM

## 2021-01-30 ENCOUNTER — Telehealth (HOSPITAL_COMMUNITY): Payer: Self-pay | Admitting: Pharmacist

## 2021-01-30 ENCOUNTER — Encounter (HOSPITAL_COMMUNITY): Payer: Self-pay | Admitting: Pharmacist

## 2021-01-30 NOTE — Progress Notes (Unsigned)
Elizabeth Mcmillan

## 2021-02-02 ENCOUNTER — Ambulatory Visit (INDEPENDENT_AMBULATORY_CARE_PROVIDER_SITE_OTHER): Payer: Medicare Other | Admitting: Family

## 2021-02-02 ENCOUNTER — Other Ambulatory Visit: Payer: Self-pay

## 2021-02-02 VITALS — BP 120/70 | HR 97 | Ht 60.0 in | Wt 125.0 lb

## 2021-02-02 DIAGNOSIS — E785 Hyperlipidemia, unspecified: Secondary | ICD-10-CM

## 2021-02-02 DIAGNOSIS — I10 Essential (primary) hypertension: Secondary | ICD-10-CM | POA: Diagnosis not present

## 2021-02-02 DIAGNOSIS — I25118 Atherosclerotic heart disease of native coronary artery with other forms of angina pectoris: Secondary | ICD-10-CM

## 2021-02-02 MED ORDER — CLOPIDOGREL BISULFATE 75 MG PO TABS: ORAL_TABLET | ORAL | 5 refills | Status: DC

## 2021-02-02 MED ORDER — CARVEDILOL 12.5 MG PO TABS: 12.5000 mg | ORAL_TABLET | Freq: Two times a day (BID) | ORAL | 5 refills | Status: DC

## 2021-02-02 NOTE — Patient Instructions (Signed)
Medication Instructions:  Your physician has recommended you make the following change in your medication:   STOP Amlodipine  CHANGE Carvedilol to 12.5mg  one tablet twice daily  We have filled out paperwork for assistance to get Brilinta at a cheaper price. IF this is not approved we will change to Clopidogrel (Plavix). You will take 300mg  (4 tablets) on the first day and then 75mg  (one tablet) daily thereafter. We will send a prescription to the pharmacy.  *If you need a refill on your cardiac medications before your next appointment, please call your pharmacy*   Lab Work: Your physician recommends that you return for lab work in 6 weeks for fasting lipid panel and CMP.   If you have labs (blood work) drawn today and your tests are completely normal, you will receive your results only by: [place] (if you have MyChart) OR A paper copy in the mail If you have any lab test that is abnormal or we need to change your treatment, we will call you to review the results.   Testing/Procedures: YourEKG today shows stable changes after your stent. There were no new or worrisome findings.    Follow-Up: At [facility], you and your health needs are our priority.  As part of our continuing mission to provide you with exceptional heart care, we have created designated Provider Care Teams.  These Care Teams include your primary Cardiologist (physician) and Advanced Practice Providers (APPs -  Physician Assistants and Nurse Practitioners) who all work together to provide you with the care you need, when you need it.  We recommend signing up for the patient portal called "MyChart".  Sign up information is provided on this After Visit Summary.  MyChart is used to connect with patients for Virtual Visits (Telemedicine).  Patients are able to view lab/test results, encounter notes, upcoming appointments, etc.  Non-urgent messages can be sent to your provider as well.   To learn more about what you  can do with MyChart, go to [email or web address].    Your next appointment:   2 month(s)  The format for your next appointment:   In Person  Provider:   You may see Sherren Mocha, MD or one of the following Advanced Practice Providers on your designated Care Team:   Richardson Dopp, PA-C [place], [place] Loel Dubonnet, NP    Other Instructions  For coronary artery disease often called "heart disease" we aim for optimal guideline directed medical therapy. We use the "A, B, C"s to help keep [place] on track!  A = Aspirin 81mg  daily B = Beta blocker which helps to relax the heart. This is your Carvedilol. C = Cholesterol control. You take Atorvasatin to help control your cholesterol.  D = Don't forget nitroglycerin! This is an emergency tablet to be used if you have chest pain. E = Extras. In your case, this is Brilinta or PLavix  Heart Healthy Diet Recommendations: A low-salt diet is recommended. Meats should be grilled, baked, or boiled. Avoid fried foods. Focus on lean protein sources like fish or chicken with vegetables and fruits. The American Heart Association is a Microbiologist!  American Heart Association Diet and Lifeystyle Recommendations   Exercise recommendations: The American Heart Association recommends 150 minutes of moderate intensity exercise weekly. Try 30 minutes of moderate intensity exercise 4-5 times per week. This could include walking, jogging, or swimming.

## 2021-02-02 NOTE — Progress Notes (Signed)
Office Visit    Patient Name: Elizabeth Mcmillan Date of Encounter: 02/02/2021  PCP:  Renee Rival, NP   [place]  Cardiologist:  Sherren Mocha, MD  Advanced Practice Provider:  No care team member to display Electrophysiologist:  None    Chief Complaint    Elizabeth Mcmillan is a 80 y.o. female with a hx of CAD s/p NSTEMI with DES x2 to LAD 01/17/21, DM2, HTN, HLD presents today for hospital follow-up  Past Medical History    Past Medical History:  Diagnosis Date   Anxiety    Diabetes mellitus without complication ([place])    GERD (gastroesophageal reflux disease)    Hypercholesteremia    Hypertension    Past Surgical History:  Procedure Laterality Date   ABDOMINAL HYSTERECTOMY     BREAST EXCISIONAL BIOPSY Left years ago   negative   COLON SURGERY     COLONOSCOPY WITH PROPOFOL N/A 09/04/2016   Procedure: COLONOSCOPY WITH PROPOFOL;  Surgeon: Manya Silvas, MD;  Location: [facility];  Service: Endoscopy;  Laterality: N/A;   CORONARY STENT INTERVENTION N/A 01/18/2021   Procedure: CORONARY STENT INTERVENTION;  Surgeon: Lorretta Harp, MD;  Location: [place] [facility];  Service: Cardiovascular;  Laterality: N/A;   INTRAVASCULAR ULTRASOUND/IVUS N/A 01/18/2021   Procedure: Intravascular Ultrasound/IVUS;  Surgeon: Lorretta Harp, MD;  Location: [facility];  Service: Cardiovascular;  Laterality: N/A;   LEFT HEART CATH AND CORONARY ANGIOGRAPHY N/A 01/18/2021   Procedure: LEFT HEART CATH AND CORONARY ANGIOGRAPHY;  Surgeon: Lorretta Harp, MD;  Location: [facility];  Service: Cardiovascular;  Laterality: N/A;   LUMBAR LAMINECTOMY/ DECOMPRESSION WITH MET-RX Right 05/09/2015   Procedure: Far Lateral Lumbar Three-Four Microdiscectomy WITH MET-RX;  Surgeon: Erline Levine, MD;  Location: [facility];  Service: Neurosurgery;  Laterality: Right;    Allergies  No Known Allergies  History of Present Illness    Elizabeth Mcmillan is a  80 y.o. female with a hx of CAD s/p NSTEMI with DES x2 to LAD 01/17/21, DM2, HTN, HLD, mitral regurgitation, aortic regurgitation, dilation of ascending aorta last seen while hospitalized.  Her family history is notable for coronary artery disease.  She presented to the ED 01/17/2021 with aching in both arms, pain in her left shoulder.  It was associated with diaphoresis and shortness of breath but no nausea or vomiting. HS troponin peaked at 1596.  She underwent cardiac catheterization 01/18/2021 with ostial LAD 50% and mid LAD 95% lesion treated with PCI/DES x2.  She was recommended for aspirin and Brilinta for at least 1 year.  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  She presents today for follow-up with her daughter.  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  Brings blood pressure log at home with blood pressures ranging 100s to 150s.  She does have an arm cuff at home but it has not been checked for accuracy. She has been having dizzy and lightheaded spells intermittently. Notes no near syncope nor syncope. Most recently it occurred while walking to another room. Notes no palpitation.  Since hospital discharge she has cut out soft drinks and drinking about 3 bottles of water per day.  Endorses eating regular meals.  She  does have concerns as Brilinta at the pharmacy was going to be >$400 we discussed patient assistance and needing to meet her out-of-pocket cost for Medicare.  She denies bleeding complications.  Reports no recurrent anginal symptoms since hospital discharge.  EKGs/Labs/Other Studies Reviewed:   The following studies were reviewed today:  Echo 01/19/2021  1. Left ventricular ejection fraction, by estimation, is 50 to 55%. The  left  ventricle has low normal function. The left ventricle demonstrates  regional wall motion abnormalities (see scoring diagram/findings for  description). Left ventricular diastolic   parameters are consistent with Grade I diastolic dysfunction (impaired  relaxation). There is severe akinesis of the left ventricular, mid-apical  anteroseptal wall and apical segment.   2. Right ventricular systolic function is normal. The right ventricular  size is normal.   3. Left atrial size was mildly dilated.   4. The mitral valve is normal in structure. Mild mitral valve  regurgitation.   5. The aortic valve is normal in structure. Aortic valve regurgitation is  mild to moderate.   6. Aortic dilatation noted. There is moderate dilatation of the ascending  aorta, measuring 40 mm.   Cath: 01/18/21   Ost LAD to Prox LAD lesion is 50% stenosed. Mid LAD lesion is 95% stenosed. A drug-eluting stent was successfully placed using a STENT ONYX FRONTIER 2.5X22. Post intervention, there is a 0% residual stenosis. A drug-eluting stent was successfully placed using a STENT ONYX FRONTIER 3.5X18. Post intervention, there is a 0% residual stenosis.   IMPRESSION: Ms. Breed had successful IVUS guided mid and proximal LAD PCI and drug-eluting stenting.  She will need to be on DAPT for 12 months uninterrupted (low-dose aspirin and Brilinta).  I used 95 cc of contrast with a max GFR of 99 cc.  She will need to be hydrated with close attention to her renal function.  A 2D echo has been ordered.  Dr. Burt Knack was notified of these results.     Quay Burow. MD, [facility] 01/18/2021 3:07 PM   Diagnostic Dominance: Right      Intervention      _____________  EKG:  EKG is  ordered today.  The ekg ordered today demonstrates NSR 97 bpm with stable anterolateral TWI. No acute St/T wave changes.   Recent Labs: 01/19/2021: BUN 20; Creatinine, Ser 1.13; Hemoglobin 11.5; Platelets 176; Potassium 3.3; Sodium 139  Recent  Lipid Panel    Component Value Date/Time   CHOL 205 (H) 01/19/2021 0308   CHOL 154 02/12/2014 0559   TRIG 401 (H) 01/19/2021 0308   TRIG 296 (H) 02/12/2014 0559   HDL 28 (L) 01/19/2021 0308   HDL 29 (L) 02/12/2014 0559   CHOLHDL 7.3 01/19/2021 0308   VLDL UNABLE TO CALCULATE IF TRIGLYCERIDE OVER 400 mg/dL 01/19/2021 0308   VLDL 59 (H) 02/12/2014 0559   LDLCALC UNABLE TO CALCULATE IF TRIGLYCERIDE OVER 400 mg/dL 01/19/2021 0308   LDLCALC 66 02/12/2014 0559   LDLDIRECT 114.2 (H) 01/19/2021 0308   Home Medications   No outpatient medications have been marked as taking for the 02/02/21 encounter (Appointment) with Loel Dubonnet, NP.     Review of Systems      All other systems reviewed and are otherwise negative except as noted above.  Physical Exam    VS:  There were no vitals taken for this visit. , BMI There is no height or weight on file to calculate BMI.  Wt Readings from Last 3 Encounters:  01/18/21 127 lb 6.8  oz (57.8 kg)  12/07/19 125 lb (56.7 kg)  04/21/17 118 lb (53.5 kg)     GEN: Well nourished, well developed, in no acute distress. HEENT: normal. Neck: Supple, no JVD, carotid bruits, or masses. Cardiac: RRR, no murmurs, rubs, or gallops. No clubbing, cyanosis, edema.  Radials/PT 2+ and equal bilaterally.  Respiratory:  Respirations regular and unlabored, clear to auscultation bilaterally. GI: Soft, nontender, nondistended. MS: No deformity or atrophy. Skin: Warm and dry, no rash. Neuro:  Strength and sensation are intact. Psych: Normal affect.  Assessment & Plan    CAD s/p DESx2 to LAD 12/2020- h=Stable with no anginal symptoms since cardiac catheterization.  Right radial catheterization site healing appropriately. No indication for ischemic evaluation.  Recommended for DAPT for at least 12 months.  She is presently on aspirin and Brilinta.  We will follow-up patient assistance paperwork for Brilinta.  If not approved and cost prohibitive, will recommend  Plavix 75 mg daily with 300 mg loading dose.  Her daughter verbalized understanding to contact her office prior to running out of medication.  Continue carvedilol, statin. Heart healthy diet and regular cardiovascular exercise encouraged.    HLD, LDL goal less than 70 -01/19/2021 total cholesterol 205, HDL 28, triglycerides 401, LDL 114.2.  Atorvastatin 80 mg daily started during recent admission.  Plan for lipid and CMP in 6 weeks.  If triglycerides remain elevated consider addition of Vascepa follow-up with MA because preoperative.  If LDL not at goal plan to add Zetia.   HTN - BP low normal today. BP readings at home 100-150s. Encouraged to bring monitor to next appt to ensure accuracy. Due to lightheadedness and tachycardia, increase carvedilol to 12.$RemoveBeforeDE'5mg'zorhUSuCibJugxS$  twice daily and stop Amlodipine. If lightheadedness, tachycardia persists consider ZIO monitor.   DM2 -01/18/2021 A1c 6.5. Continue to follow with PCP.   Valvular heart disease -Echo 01/19/2021 normal LVEF, mild MR, mild to moderate AI. Consider repeat echo in 1 year. Continue optimal BP control.   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  Disposition: Follow up in 2 month(s) with Dr. Burt Knack or APP.  Signed, Loel Dubonnet, NP 02/02/2021, 1:23 PM [place]

## 2021-02-03 ENCOUNTER — Encounter (HOSPITAL_BASED_OUTPATIENT_CLINIC_OR_DEPARTMENT_OTHER): Payer: Self-pay | Admitting: Family

## 2021-02-16 ENCOUNTER — Telehealth: Payer: Self-pay | Admitting: Family

## 2021-02-16 MED ORDER — CLOPIDOGREL BISULFATE 75 MG PO TABS: 75.0000 mg | ORAL_TABLET | Freq: Every day | ORAL | 3 refills | Status: DC

## 2021-02-16 NOTE — Telephone Encounter (Signed)
Unsure if patient assistance has been approved. Daughter can call to check by calling [phone]). I am unable to call as I am currently in clinic.  To prevent interruption in therapy, please send Rx for Clopidogrel (Plavix). She will take Brilinta tonight and start Clopidogrel (Plavix) in the morning.  She will take 4 tablets ('300mg'$ ) in the morning then take 1 tablet ('75mg'$ ) daily.   Loel Dubonnet, NP

## 2021-02-16 NOTE — Telephone Encounter (Signed)
Pt c/o medication issue:  1. Name of Medication:  ticagrelor (BRILINTA) 90 MG TABS tablet  2. How are you currently taking this medication (dosage and times per day)?  As prescribed  3. Are you having a reaction (difficulty breathing--STAT)?  No   4. What is your medication issue?   Patient's daughter states the patient applied for patient assistance for Brilinta, but they haven't gotten an update on it and she will be out of medication by 02/17/21. She states the patient is unable to afford the medication and she would like to know if there is anything that can be done to last her until she gets an update on the patient assistance. If able to assist, she is hoping to have [id number] [place] [place] - [address]  Please advise.

## 2021-02-16 NOTE — Telephone Encounter (Signed)
Spoke with the pts daughter and she will call the number provided to see if the pt was able to get assistance and I advised her that I will call her back shortly and if not we can switch her meds as offered by [facility] because she will be out of [facility] starting this Sunday.

## 2021-02-16 NOTE — Telephone Encounter (Signed)
Discussed loading dose of plavix and subsequent dosing with daughter. She states her mother has enough brilinta through Sunday. She is sure she has enough Brilinta tablets (2 tabs each day) through Sunday. She will begin the Plavix (loading) on Monday.  She will call back with any questions.

## 2021-04-01 NOTE — Progress Notes (Signed)
Office Visit    Patient Name: Elizabeth Mcmillan Date of Encounter: 04/02/2021  PCP:  Pcp, No   [facility]  Cardiologist:  Sherren Mocha, MD  Advanced Practice Provider:  No care team member to display Electrophysiologist:  None    Chief Complaint    Elizabeth Mcmillan is a 80 y.o. female with a hx of CAD s/p NSTEMI with DES x2 to LAD 01/17/21, DM2, HTN, HLD presents today for follow up of CAD  Past Medical History    Past Medical History:  Diagnosis Date   Anxiety    Diabetes mellitus without complication ([place])    GERD (gastroesophageal reflux disease)    Hypercholesteremia    Hypertension    Past Surgical History:  Procedure Laterality Date   ABDOMINAL HYSTERECTOMY     BREAST EXCISIONAL BIOPSY Left years ago   negative   COLON SURGERY     COLONOSCOPY WITH PROPOFOL N/A 09/04/2016   Procedure: COLONOSCOPY WITH PROPOFOL;  Surgeon: Manya Silvas, MD;  Location: [place];  Service: Endoscopy;  Laterality: N/A;   CORONARY STENT INTERVENTION N/A 01/18/2021   Procedure: CORONARY STENT INTERVENTION;  Surgeon: Lorretta Harp, MD;  Location: [place] [facility];  Service: Cardiovascular;  Laterality: N/A;   INTRAVASCULAR ULTRASOUND/IVUS N/A 01/18/2021   Procedure: Intravascular Ultrasound/IVUS;  Surgeon: Lorretta Harp, MD;  Location: [place] [facility];  Service: Cardiovascular;  Laterality: N/A;   LEFT HEART CATH AND CORONARY ANGIOGRAPHY N/A 01/18/2021   Procedure: LEFT HEART CATH AND CORONARY ANGIOGRAPHY;  Surgeon: Lorretta Harp, MD;  Location: [place] [facility];  Service: Cardiovascular;  Laterality: N/A;   LUMBAR LAMINECTOMY/ DECOMPRESSION WITH MET-RX Right 05/09/2015   Procedure: Far Lateral Lumbar Three-Four Microdiscectomy WITH MET-RX;  Surgeon: Erline Levine, MD;  Location: [place] [facility];  Service: Neurosurgery;  Laterality: Right;    Allergies  No Known Allergies  History of Present Illness    Elizabeth Mcmillan is a 80 y.o. female  with a hx of CAD s/p NSTEMI with DES x2 to LAD 01/17/21, DM2, HTN, HLD, mitral regurgitation, aortic regurgitation, dilation of ascending aorta last seen 02/02/21  Her family history is notable for coronary artery disease.  She presented to the ED 01/17/2021 with aching in both arms, pain in her left shoulder.  It was associated with diaphoresis and shortness of breath but no nausea or vomiting. HS troponin peaked at 1596.  She underwent cardiac catheterization 01/18/2021 with ostial LAD 50% and mid LAD 95% lesion treated with PCI/DES x2.  She was recommended for aspirin and Brilinta for at least 1 year.  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  Seen in follow up 02/02/21 with her daughter. 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 She was working on a more healthy diet. Given mild tachycardia with palpitations and to prevent hypotension, Amlodipine was stopped and Carvedilol increased. She was transitioned from Brilinta to Plavix due to cost.   She presents today for follow up. Has been back to work 3 days per week cutting hair at [employer] which she enjoys. Checks her blood pressure at home with readings in the low 130s. Reports no shortness of breath nor dyspnea on exertion. Reports no chest pain, pressure,  or tightness. No edema, orthopnea, PND. Reports no palpitations.  Tolerating Plavix with mild bruising. Tells me Brilinta patient assistance was approved but she has not yet received the medication.   EKGs/Labs/Other Studies Reviewed:   The following studies were reviewed today:  Echo 01/19/2021  1. Left ventricular ejection fraction, by estimation, is 50 to 55%. The  left ventricle has low normal function. The left ventricle demonstrates  regional  wall motion abnormalities (see scoring diagram/findings for  description). Left ventricular diastolic   parameters are consistent with Grade I diastolic dysfunction (impaired  relaxation). There is severe akinesis of the left ventricular, mid-apical  anteroseptal wall and apical segment.   2. Right ventricular systolic function is normal. The right ventricular  size is normal.   3. Left atrial size was mildly dilated.   4. The mitral valve is normal in structure. Mild mitral valve  regurgitation.   5. The aortic valve is normal in structure. Aortic valve regurgitation is  mild to moderate.   6. Aortic dilatation noted. There is moderate dilatation of the ascending  aorta, measuring 40 mm.   Cath: 01/18/21   Ost LAD to Prox LAD lesion is 50% stenosed. Mid LAD lesion is 95% stenosed. A drug-eluting stent was successfully placed using a STENT ONYX FRONTIER 2.5X22. Post intervention, there is a 0% residual stenosis. A drug-eluting stent was successfully placed using a STENT ONYX FRONTIER 3.5X18. Post intervention, there is a 0% residual stenosis.   IMPRESSION: Elizabeth Mcmillan had successful IVUS guided mid and proximal LAD PCI and drug-eluting stenting.  She will need to be on DAPT for 12 months uninterrupted (low-dose aspirin and Brilinta).  I used 95 cc of contrast with a max GFR of 99 cc.  She will need to be hydrated with close attention to her renal function.  A 2D echo has been ordered.  Dr. Burt Knack was notified of these results.     Quay Burow. MD, [facility] 01/18/2021 3:07 PM   Diagnostic Dominance: Right      Intervention      _____________  EKG:  No EKG is  ordered today.  The ekg independently reviewed from 02/02/21 demonstrated  NSR 97 bpm with stable anterolateral TWI. No acute St/T wave changes.   Recent Labs: 01/19/2021: BUN 20; Creatinine, Ser 1.13; Hemoglobin 11.5; Platelets 176; Potassium 3.3; Sodium 139  Recent Lipid Panel    Component Value Date/Time   CHOL 205  (H) 01/19/2021 0308   CHOL 154 02/12/2014 0559   TRIG 401 (H) 01/19/2021 0308   TRIG 296 (H) 02/12/2014 0559   HDL 28 (L) 01/19/2021 0308   HDL 29 (L) 02/12/2014 0559   CHOLHDL 7.3 01/19/2021 0308   VLDL UNABLE TO CALCULATE IF TRIGLYCERIDE OVER 400 mg/dL 01/19/2021 0308   VLDL 59 (H) 02/12/2014 0559   LDLCALC UNABLE TO CALCULATE IF TRIGLYCERIDE OVER 400 mg/dL 01/19/2021 0308   LDLCALC 66 02/12/2014 0559   LDLDIRECT 114.2 (H) 01/19/2021 0308   Home Medications   Current Meds  Medication Sig   acetaminophen (TYLENOL) 500 MG tablet Take 1,000 mg by mouth every 6 (six) hours as needed (pain).   aspirin EC 81 MG tablet Take 81 mg by mouth at bedtime.   atorvastatin (LIPITOR) 80 MG tablet Take 1 tablet (80 mg total) by mouth daily.   carvedilol (COREG) 12.5 MG tablet Take 1 tablet (12.5 mg total) by mouth 2 (two) times daily with a meal.   clopidogrel (PLAVIX) 75 MG tablet Take 1 tablet (75 mg  total) by mouth daily. The 1st day of medication, take 4 tabs ($Remov'300mg'DZzhkQ$ ) loading dose. Then take 1 tab ($Remo'75mg'egKbb$ ) daily   diphenhydramine-acetaminophen (TYLENOL PM) 25-500 MG TABS tablet Take 1 tablet by mouth at bedtime.   nitroGLYCERIN (NITROSTAT) 0.4 MG SL tablet Place 1 tablet (0.4 mg total) under the tongue every 5 (five) minutes as needed.   venlafaxine XR (EFFEXOR-XR) 150 MG 24 hr capsule Take 150 mg by mouth daily.     Review of Systems      All other systems reviewed and are otherwise negative except as noted above.  Physical Exam    VS:  BP 138/70   Pulse 81   Ht 5' (1.524 m)   Wt 127 lb (57.6 kg)   SpO2 98%   BMI 24.80 kg/m  , BMI Body mass index is 24.8 kg/m.  Wt Readings from Last 3 Encounters:  04/02/21 127 lb (57.6 kg)  02/02/21 125 lb (56.7 kg)  01/18/21 127 lb 6.8 oz (57.8 kg)     GEN: Well nourished, well developed, in no acute distress. HEENT: normal. Neck: Supple, no JVD, carotid bruits, or masses. Cardiac: RRR, no murmurs, rubs, or gallops. No clubbing, cyanosis,  edema.  Radials/PT 2+ and equal bilaterally.  Respiratory:  Respirations regular and unlabored, clear to auscultation bilaterally. GI: Soft, nontender, nondistended. MS: No deformity or atrophy. Skin: Warm and dry, no rash. Neuro:  Strength and sensation are intact. Psych: Normal affect.  Assessment & Plan    CAD s/p DESx2 to LAD 12/2020- Recommended for DAPT for at least 12 months. Presently on ASA/PLavix. Tells me patient assistance for brilinta approved but medication not received, we will reach out to [employer] for clarification. Stable with no anginal symptoms. No indication for ischemic evaluation.  GDMT includes DAPT, Plavix, Atorvastatin.   HLD, LDL goal less than 70 -01/19/2021 total cholesterol 205, HDL 28, triglycerides 401, LDL 114.2.  Atorvastatin 80 mg daily started during admission.  Due for repeat FLP, CMP ordered today. She will have them done this week.   HTN - BP well controlled. Continue current antihypertensive regimen.    DM2 -01/18/2021 A1c 6.5. Continue to follow with PCP.   Valvular heart disease -Echo 01/19/2021 normal LVEF, mild MR, mild to moderate AI. Consider repeat echo in 1 year. Continue optimal BP control.   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 Continue optimal BP control.   Disposition: Follow up in 4-6 months Dr. Burt Knack or APP.  Signed, Loel Dubonnet, NP 04/02/2021, 2:07 PM [place]

## 2021-04-02 ENCOUNTER — Other Ambulatory Visit: Payer: Self-pay

## 2021-04-02 ENCOUNTER — Encounter (HOSPITAL_BASED_OUTPATIENT_CLINIC_OR_DEPARTMENT_OTHER): Payer: Self-pay | Admitting: Family

## 2021-04-02 ENCOUNTER — Ambulatory Visit (INDEPENDENT_AMBULATORY_CARE_PROVIDER_SITE_OTHER): Payer: Medicare Other | Admitting: Family

## 2021-04-02 VITALS — BP 128/72 | HR 81 | Ht 60.0 in | Wt 127.0 lb

## 2021-04-02 DIAGNOSIS — I25118 Atherosclerotic heart disease of native coronary artery with other forms of angina pectoris: Secondary | ICD-10-CM | POA: Diagnosis not present

## 2021-04-02 DIAGNOSIS — I10 Essential (primary) hypertension: Secondary | ICD-10-CM

## 2021-04-02 DIAGNOSIS — E785 Hyperlipidemia, unspecified: Secondary | ICD-10-CM | POA: Diagnosis not present

## 2021-04-02 NOTE — Patient Instructions (Signed)
Medication Instructions:  Continue your current medications.   Loel Dubonnet, NP will reach out to [employer] (Brilinta patient assistance) to see why they your Kary Kos was not mailed. If they are able to mail - we will switch you from Plavix to Brilinta.   *If you need a refill on your cardiac medications before your next appointment, please call your pharmacy*   Lab Work: Your provider has recommended lab work this week of next for fasting lipid panel and CMP. Please have this collected at [facility]. The lab is open 8:00 am - 4:30 pm. Please avoid 12:00p - 1:00p for lunch hour. You do not need an appointment. Please go to [address] [place], [place] [address]. This is in the Primary Care office on the 3rd floor, let them know you are there for blood work and they will direct you to the lab.  If you have labs (blood work) drawn today and your tests are completely normal, you will receive your results only by: [place] (if you have MyChart) OR A paper copy in the mail If you have any lab test that is abnormal or we need to change your treatment, we will call you to review the results.   Testing/Procedures: None ordered today.   Follow-Up: At [facility], you and your health needs are our priority.  As part of our continuing mission to provide you with exceptional heart care, we have created designated Provider Care Teams.  These Care Teams include your primary Cardiologist (physician) and Advanced Practice Providers (APPs -  Physician Assistants and Nurse Practitioners) who all work together to provide you with the care you need, when you need it.  We recommend signing up for the patient portal called "MyChart".  Sign up information is provided on this After Visit Summary.  MyChart is used to connect with patients for Virtual Visits (Telemedicine).  Patients are able to view lab/test results, encounter notes, upcoming appointments, etc.   Non-urgent messages can be sent to your provider as well.   To learn more about what you can do with MyChart, go to [email or web address].    Your next appointment:   4-6 months  The format for your next appointment:   In Person  Provider:   You may see Sherren Mocha, MD or one of the following Advanced Practice Providers on your designated Care Team:   Richardson Dopp, PA-C [place], [place]   Other Instructions  Heart Healthy Diet Recommendations: A low-salt diet is recommended. Meats should be grilled, baked, or boiled. Avoid fried foods. Focus on lean protein sources like fish or chicken with vegetables and fruits. The American Heart Association is a Microbiologist!   Exercise recommendations: The American Heart Association recommends 150 minutes of moderate intensity exercise weekly. Try 30 minutes of moderate intensity exercise 4-5 times per week. This could include walking, jogging, or swimming.

## 2021-04-03 ENCOUNTER — Telehealth (HOSPITAL_BASED_OUTPATIENT_CLINIC_OR_DEPARTMENT_OTHER): Payer: Self-pay | Admitting: Family

## 2021-04-03 NOTE — Telephone Encounter (Signed)
At visit with Urban Gibson, NP, pt stated she had filled out patient assistance forms for Brilinta through [employer]. She stated she received a letter stating she was approved, but had not received her medication yet.  Called [employer] 04/02/21 and left a voicemail d/t long wait time. Called again today, 04/03/21, waited for 40 minutes before reaching someone. They stated pt did not have an RX for Brilinta sent in, so they could not ship the Brilinta to her yet. They stated we can either fax Brilinta Rx to [employer] or put in verbal Rx to the specialty pharmacy, [facility] [phone]).   Called [facility] and gave verbal Rx for Brilinta 90 mg BID per Laurann Montana, NP. Pharmacy stated they did not have a profile for this pt in their system yet. Spoke to pharmacist who took verbal Rx. She stated pt will need to call [employer] to order the medication.   Called pt and left detailed vm (ok per DPR) to inform her of need to call [employer] to order medication to be mailed to her address. Gave phone number for [employer] [phone]).

## 2021-04-17 LAB — COMPREHENSIVE METABOLIC PANEL
ALT: 12 IU/L (ref 0–32)
AST: 15 IU/L (ref 0–40)
Albumin/Globulin Ratio: 1.5 (ref 1.2–2.2)
Albumin: 4 g/dL (ref 3.7–4.7)
Alkaline Phosphatase: 114 IU/L (ref 44–121)
BUN/Creatinine Ratio: 15 (ref 12–28)
BUN: 16 mg/dL (ref 8–27)
Bilirubin Total: 0.5 mg/dL (ref 0.0–1.2)
CO2: 22 mmol/L (ref 20–29)
Calcium: 9.1 mg/dL (ref 8.7–10.3)
Chloride: 107 mmol/L — ABNORMAL HIGH (ref 96–106)
Creatinine, Ser: 1.04 mg/dL — ABNORMAL HIGH (ref 0.57–1.00)
Globulin, Total: 2.6 g/dL (ref 1.5–4.5)
Glucose: 145 mg/dL — ABNORMAL HIGH (ref 70–99)
Potassium: 4.5 mmol/L (ref 3.5–5.2)
Sodium: 143 mmol/L (ref 134–144)
Total Protein: 6.6 g/dL (ref 6.0–8.5)
eGFR: 55 mL/min/{1.73_m2} — ABNORMAL LOW (ref >59)

## 2021-04-17 LAB — LIPID PANEL
Chol/HDL Ratio: 4 ratio (ref 0.0–4.4)
Cholesterol, Total: 165 mg/dL (ref 100–199)
HDL: 41 mg/dL (ref >39)
LDL Chol Calc (NIH): 82 mg/dL (ref 0–99)
Triglycerides: 253 mg/dL — ABNORMAL HIGH (ref 0–149)
VLDL Cholesterol Cal: 42 mg/dL — ABNORMAL HIGH (ref 5–40)

## 2021-04-18 ENCOUNTER — Telehealth: Payer: Self-pay | Admitting: *Deleted

## 2021-04-18 NOTE — Telephone Encounter (Signed)
I placed call to patient but was unable to leave a message as mailbox was full.

## 2021-04-18 NOTE — Telephone Encounter (Signed)
-----   Message from Loel Dubonnet, NP sent at 04/18/2021  1:10 PM EDT ----- Stable kidney function. Normal electrolytes and liver function. Cholesterol panel shows improvement. Total cholesterol at goal of <70. Triglycerides not at goal of <150 and LDL not at goal of <70. Continue Atorvastatin 80mg  QD. Start Zetia 10mg  QD. Repeat lipid panel/LFT in 2 mos.

## 2021-04-19 NOTE — Telephone Encounter (Signed)
Left message for patient to call office for lab results and plan of care.

## 2021-04-20 NOTE — Telephone Encounter (Signed)
Left message for pt to call.

## 2021-04-24 ENCOUNTER — Encounter: Payer: Self-pay | Admitting: *Deleted

## 2021-04-24 NOTE — Telephone Encounter (Signed)
Letter of results sent to pt asking her to call to discuss 

## 2021-04-27 ENCOUNTER — Telehealth: Payer: Self-pay | Admitting: *Deleted

## 2021-04-27 DIAGNOSIS — E785 Hyperlipidemia, unspecified: Secondary | ICD-10-CM

## 2021-04-27 NOTE — Telephone Encounter (Signed)
-----   Message from Loel Dubonnet, NP sent at 04/18/2021  1:10 PM EDT ----- Stable kidney function. Normal electrolytes and liver function. Cholesterol panel shows improvement. Total cholesterol at goal of <70. Triglycerides not at goal of <150 and LDL not at goal of <70. Continue Atorvastatin 80mg  QD. Start Zetia 10mg  QD. Repeat lipid panel/LFT in 2 mos.

## 2021-04-27 NOTE — Telephone Encounter (Signed)
The patient has been notified of the result and verbalized understanding.  All questions (if any) were answered. mailed lab slip for  Liver , panel, lipid  to do in 2 months Raiford Simmonds, RN 04/27/2021 10:29 AM

## 2021-05-21 ENCOUNTER — Other Ambulatory Visit: Payer: Self-pay

## 2021-05-21 ENCOUNTER — Telehealth: Payer: Self-pay | Admitting: Cardiovascular Disease

## 2021-05-21 MED ORDER — ATORVASTATIN CALCIUM 80 MG PO TABS: 80.0000 mg | ORAL_TABLET | Freq: Every day | ORAL | 3 refills | Status: DC

## 2021-05-21 NOTE — Telephone Encounter (Signed)
 *  STAT* If patient is at the pharmacy, call can be transferred to refill team.   1. Which medications need to be refilled? (please list name of each medication and dose if known)   atorvastatin (LIPITOR) 80 MG tablet   2. Which pharmacy/location (including street and city if local pharmacy) is medication to be sent to?  [id number] - [place], [place] - [address]  3. Do they need a 30 day or 90 day supply? 90 days

## 2021-08-29 ENCOUNTER — Other Ambulatory Visit: Payer: Self-pay

## 2021-08-29 ENCOUNTER — Emergency Department (HOSPITAL_BASED_OUTPATIENT_CLINIC_OR_DEPARTMENT_OTHER): Payer: Medicare Other | Admitting: Radiology

## 2021-08-29 ENCOUNTER — Observation Stay (HOSPITAL_BASED_OUTPATIENT_CLINIC_OR_DEPARTMENT_OTHER)
Admission: EM | Admit: 2021-08-29 | Discharge: 2021-08-31 | Disposition: A | Discharge status: Home / Self Care | Payer: Medicare Other | Attending: Internal Medicine | Admitting: Internal Medicine

## 2021-08-29 ENCOUNTER — Telehealth: Payer: Self-pay | Admitting: Cardiovascular Disease

## 2021-08-29 ENCOUNTER — Emergency Department (HOSPITAL_BASED_OUTPATIENT_CLINIC_OR_DEPARTMENT_OTHER): Payer: Medicare Other

## 2021-08-29 ENCOUNTER — Encounter (HOSPITAL_BASED_OUTPATIENT_CLINIC_OR_DEPARTMENT_OTHER): Payer: Self-pay | Admitting: Emergency Medicine

## 2021-08-29 DIAGNOSIS — R0602 Shortness of breath: Secondary | ICD-10-CM | POA: Diagnosis not present

## 2021-08-29 DIAGNOSIS — Z7982 Long term (current) use of aspirin: Secondary | ICD-10-CM | POA: Insufficient documentation

## 2021-08-29 DIAGNOSIS — I10 Essential (primary) hypertension: Secondary | ICD-10-CM | POA: Diagnosis not present

## 2021-08-29 DIAGNOSIS — R42 Dizziness and giddiness: Secondary | ICD-10-CM

## 2021-08-29 DIAGNOSIS — I160 Hypertensive urgency: Secondary | ICD-10-CM | POA: Insufficient documentation

## 2021-08-29 DIAGNOSIS — Z7984 Long term (current) use of oral hypoglycemic drugs: Secondary | ICD-10-CM | POA: Insufficient documentation

## 2021-08-29 DIAGNOSIS — W108XXA Fall (on) (from) other stairs and steps, initial encounter: Secondary | ICD-10-CM | POA: Insufficient documentation

## 2021-08-29 DIAGNOSIS — Z20822 Contact with and (suspected) exposure to covid-19: Secondary | ICD-10-CM | POA: Insufficient documentation

## 2021-08-29 DIAGNOSIS — E119 Type 2 diabetes mellitus without complications: Secondary | ICD-10-CM | POA: Diagnosis not present

## 2021-08-29 DIAGNOSIS — I700 Atherosclerosis of aorta: Secondary | ICD-10-CM | POA: Diagnosis present

## 2021-08-29 DIAGNOSIS — I252 Old myocardial infarction: Secondary | ICD-10-CM | POA: Diagnosis not present

## 2021-08-29 DIAGNOSIS — Z7902 Long term (current) use of antithrombotics/antiplatelets: Secondary | ICD-10-CM | POA: Diagnosis not present

## 2021-08-29 DIAGNOSIS — Z79899 Other long term (current) drug therapy: Secondary | ICD-10-CM | POA: Diagnosis not present

## 2021-08-29 DIAGNOSIS — I2510 Atherosclerotic heart disease of native coronary artery without angina pectoris: Secondary | ICD-10-CM | POA: Diagnosis not present

## 2021-08-29 DIAGNOSIS — W19XXXA Unspecified fall, initial encounter: Secondary | ICD-10-CM

## 2021-08-29 DIAGNOSIS — G4700 Insomnia, unspecified: Secondary | ICD-10-CM

## 2021-08-29 DIAGNOSIS — E7800 Pure hypercholesterolemia, unspecified: Secondary | ICD-10-CM | POA: Diagnosis present

## 2021-08-29 LAB — URINALYSIS, ROUTINE W REFLEX MICROSCOPIC
Bilirubin Urine: NEGATIVE
Glucose, UA: NEGATIVE mg/dL
Hgb urine dipstick: NEGATIVE
Ketones, ur: NEGATIVE mg/dL
Leukocytes,Ua: NEGATIVE
Nitrite: NEGATIVE
Protein, ur: 30 mg/dL — AB
Specific Gravity, Urine: 1.019 (ref 1.005–1.030)
pH: 6.5 (ref 5.0–8.0)

## 2021-08-29 LAB — COMPREHENSIVE METABOLIC PANEL
ALT: 13 U/L (ref 0–44)
AST: 15 U/L (ref 15–41)
Albumin: 4.3 g/dL (ref 3.5–5.0)
Alkaline Phosphatase: 124 U/L (ref 38–126)
Anion gap: 10 (ref 5–15)
BUN: 26 mg/dL — ABNORMAL HIGH (ref 8–23)
CO2: 25 mmol/L (ref 22–32)
Calcium: 9.4 mg/dL (ref 8.9–10.3)
Chloride: 105 mmol/L (ref 98–111)
Creatinine, Ser: 0.9 mg/dL (ref 0.44–1.00)
GFR, Estimated: >60 mL/min (ref >60)
Glucose, Bld: 106 mg/dL — ABNORMAL HIGH (ref 70–99)
Potassium: 4 mmol/L (ref 3.5–5.1)
Sodium: 140 mmol/L (ref 135–145)
Total Bilirubin: 0.6 mg/dL (ref 0.3–1.2)
Total Protein: 6.6 g/dL (ref 6.5–8.1)

## 2021-08-29 LAB — CBC
HCT: 37.4 % (ref 36.0–46.0)
Hemoglobin: 12 g/dL (ref 12.0–15.0)
MCH: 32.7 pg (ref 26.0–34.0)
MCHC: 32.1 g/dL (ref 30.0–36.0)
MCV: 101.9 fL — ABNORMAL HIGH (ref 80.0–100.0)
Platelets: 225 10*3/uL (ref 150–400)
RBC: 3.67 MIL/uL — ABNORMAL LOW (ref 3.87–5.11)
RDW: 13.5 % (ref 11.5–15.5)
WBC: 11.2 10*3/uL — ABNORMAL HIGH (ref 4.0–10.5)
nRBC: 0 % (ref 0.0–0.2)

## 2021-08-29 LAB — TROPONIN I (HIGH SENSITIVITY)
Troponin I (High Sensitivity): 22 ng/L — ABNORMAL HIGH (ref <18)
Troponin I (High Sensitivity): 23 ng/L — ABNORMAL HIGH (ref <18)
Troponin I (High Sensitivity): 22 ng/L — ABNORMAL HIGH (ref <18)

## 2021-08-29 LAB — RESP PANEL BY RT-PCR (FLU A&B, COVID) ARPGX2
Influenza A by PCR: NEGATIVE
Influenza B by PCR: NEGATIVE
SARS Coronavirus 2 by RT PCR: NEGATIVE

## 2021-08-29 LAB — D-DIMER, QUANTITATIVE: D-Dimer, Quant: 0.79 ug/mL-FEU — ABNORMAL HIGH (ref 0.00–0.50)

## 2021-08-29 LAB — BRAIN NATRIURETIC PEPTIDE: B Natriuretic Peptide: 104.5 pg/mL — ABNORMAL HIGH (ref 0.0–100.0)

## 2021-08-29 MED ORDER — IOHEXOL 350 MG/ML SOLN
75.0000 mL | Freq: Once | INTRAVENOUS | Status: AC | PRN
  Administered 2021-08-29: 75 mL via INTRAVENOUS

## 2021-08-29 MED ORDER — LABETALOL HCL 5 MG/ML IV SOLN
10.0000 mg | Freq: Once | INTRAVENOUS | Status: AC
  Administered 2021-08-29: 10 mg via INTRAVENOUS
  Filled 2021-08-29: qty 4

## 2021-08-29 MED ORDER — LABETALOL HCL 5 MG/ML IV SOLN
10.0000 mg | INTRAVENOUS | Status: DC | PRN
  Administered 2021-08-29 – 2021-08-30 (×2): 10 mg via INTRAVENOUS
  Filled 2021-08-29 (×2): qty 4

## 2021-08-29 MED ORDER — CARVEDILOL 12.5 MG PO TABS
12.5000 mg | ORAL_TABLET | Freq: Two times a day (BID) | ORAL | Status: DC
  Administered 2021-08-29 – 2021-08-31 (×4): 12.5 mg via ORAL
  Filled 2021-08-29 (×4): qty 1

## 2021-08-29 MED ORDER — HYDRALAZINE HCL 20 MG/ML IJ SOLN
10.0000 mg | Freq: Once | INTRAMUSCULAR | Status: AC
  Administered 2021-08-29: 10 mg via INTRAVENOUS
  Filled 2021-08-29: qty 1

## 2021-08-29 MED ORDER — ACETAMINOPHEN 325 MG PO TABS
650.0000 mg | ORAL_TABLET | Freq: Once | ORAL | Status: AC
  Administered 2021-08-29: 650 mg via ORAL
  Filled 2021-08-29: qty 2

## 2021-08-29 NOTE — Treatment Plan (Signed)
81 yo with CAD s/p DES 01/18/2021 placement on aspirin and brilinta, HTN, HLD, DM who presented to the ED initially after Elizabeth Mcmillan mechanical fall with right sided chest pain.  Apparently she landed on her right ribs and was c/o right breast and rib pain as well as worsening SOB with exertion.  Daughter noted she was having progressive SOB prior to fall.  Per EDP, no evidence of HF and lower suspicion for PE with lack of hypoxia and tachycardia.   Vitals notable for significant hypertension, after last dose of hydralazine, patient complained of CP and due to her persistent symptoms (chest pain) in addition to significant hypertension and DOE, EDP requested admission for additional w/u.  Labs notable for mildly elevated and flat troponins.  Mild leukocytosis.  CT chest without acute traumatic injury within thorax, trace pericardial fluid, no acute lung process.  EKG shows sinus rhythm.  Requested troponin given chest pain in ED.  BNP and d dimer given DOE (she's already had CT scan without contrast) so will hold off on repeating unless labs support this.  Unclear if she took home BP meds, ED going to follow up regarding this.  Will place in obs for further w/u of chest pain, doe, HTN.

## 2021-08-29 NOTE — ED Provider Notes (Signed)
[facility] EMERGENCY DEPT Provider Note   CSN: [id number] Arrival date & time: 08/29/21  2993     History  Chief Complaint  Patient presents with   Lytle Michaels    MAGDALYNN DAVILLA is a 81 y.o. female.  HPI  81 year old female with past medical history of CAD with previous stent on aspirin and Brilinta, HTN, HLD, DM presents to the emergency department with right-sided chest pain after a fall.  Patient states she had a mechanical fall couple days ago.  She landed down onto her right ribs.  Since then she has been having right breast and right rib pain.  She also complains of worsening shortness of breath with exertion, mostly due to the pain.  Did not hit her head, no loss of consciousness.  No recent fever, cough, lower extremity swelling, acute illness.  Home Medications Prior to Admission medications   Medication Sig Start Date End Date Taking? Authorizing Provider  aspirin EC 81 MG tablet Take 81 mg by mouth at bedtime.   Yes [provider]  atorvastatin (LIPITOR) 80 MG tablet Take 1 tablet (80 mg total) by mouth daily. 05/21/21  Yes Sherren Mocha, MD  carvedilol (COREG) 12.5 MG tablet Take 1 tablet (12.5 mg total) by mouth 2 (two) times daily with a meal. 02/02/21  Yes Loel Dubonnet, NP  clopidogrel (PLAVIX) 75 MG tablet Take 1 tablet (75 mg total) by mouth daily. The 1st day of medication, take 4 tabs (300mg ) loading dose. Then take 1 tab (75mg ) daily 02/16/21  Yes Loel Dubonnet, NP  diphenhydramine-acetaminophen (TYLENOL PM) 25-500 MG TABS tablet Take 1 tablet by mouth at bedtime.   Yes [provider]  metFORMIN (GLUCOPHAGE-XR) 750 MG 24 hr tablet Take 750 mg by mouth every evening. 08/19/21  Yes [provider]  ticagrelor (BRILINTA) 90 MG TABS tablet Take 90 mg by mouth every 12 (twelve) hours.   Yes [provider]  venlafaxine XR (EFFEXOR-XR) 150 MG 24 hr capsule Take 150 mg by mouth daily.   Yes [provider]   acetaminophen (TYLENOL) 500 MG tablet Take 1,000 mg by mouth every 6 (six) hours as needed (pain).    [provider]  nitroGLYCERIN (NITROSTAT) 0.4 MG SL tablet Place 1 tablet (0.4 mg total) under the tongue every 5 (five) minutes as needed. 01/19/21   Cheryln Manly, NP      Allergies    Patient has no known allergies.    Review of Systems   Review of Systems  Constitutional:  Positive for fatigue. Negative for fever.  Respiratory:  Positive for shortness of breath.   Cardiovascular:  Positive for chest pain. Negative for palpitations and leg swelling.  Gastrointestinal:  Negative for abdominal pain, diarrhea and vomiting.  Musculoskeletal:        Right rib and right breast pain  Skin:  Negative for rash.  Neurological:  Negative for headaches.   Physical Exam Updated Vital Signs BP (!) 217/78    Pulse 75    Temp 98.4 F (36.9 C) (Oral)    Resp 17    Wt 59 kg    SpO2 100%    BMI 25.39 kg/m  Physical Exam Vitals and nursing note reviewed.  Constitutional:      General: She is not in acute distress.    Appearance: Normal appearance. She is not diaphoretic.  HENT:     Head: Normocephalic.     Mouth/Throat:     Mouth: Mucous membranes are  moist.  Cardiovascular:     Rate and Rhythm: Normal rate.  Pulmonary:     Effort: Pulmonary effort is normal. No respiratory distress.     Comments: Diminished at bases Abdominal:     Palpations: Abdomen is soft.     Tenderness: There is no abdominal tenderness.  Musculoskeletal:     Comments: Right breast bruising and tenderness, tenderness to palpation of the right anterior ribs, no crepitus  Skin:    General: Skin is warm.  Neurological:     Mental Status: She is alert and oriented to person, place, and time. Mental status is at baseline.  Psychiatric:        Mood and Affect: Mood normal.    ED Results / Procedures / Treatments   Labs (all labs ordered are listed, but only abnormal results are displayed) Labs  Reviewed  COMPREHENSIVE METABOLIC PANEL - Abnormal; Notable for the following components:      Result Value   Glucose, Bld 106 (*)    BUN 26 (*)    All other components within normal limits  CBC - Abnormal; Notable for the following components:   WBC 11.2 (*)    RBC 3.67 (*)    MCV 101.9 (*)    All other components within normal limits  URINALYSIS, ROUTINE W REFLEX MICROSCOPIC - Abnormal; Notable for the following components:   Protein, ur 30 (*)    All other components within normal limits  TROPONIN I (HIGH SENSITIVITY) - Abnormal; Notable for the following components:   Troponin I (High Sensitivity) 22 (*)    All other components within normal limits  TROPONIN I (HIGH SENSITIVITY) - Abnormal; Notable for the following components:   Troponin I (High Sensitivity) 23 (*)    All other components within normal limits    EKG EKG Interpretation  Date/Time:  Wednesday August 29 2021 10:43:47 EST Ventricular Rate:  81 PR Interval:  158 QRS Duration: 64 QT Interval:  382 QTC Calculation: 443 R Axis:   14 Text Interpretation: Normal sinus rhythm Normal ECG When compared with ECG of 18-Jan-2021 14:59, No significant change was found Confirmed by Lavenia Atlas [phone]) on 08/29/2021 3:09:03 PM  Radiology DG Chest 2 View  Result Date: 08/29/2021 CLINICAL DATA:  pain in chest wall from fall EXAM: CHEST - 2 VIEW COMPARISON:  January 17, 2021. FINDINGS: Streaky left basilar opacities. No confluent consolidation. No visible pleural effusions or pneumothorax. Biapical pleuroparenchymal scarring. Cardiomediastinal silhouette is within normal limits and similar to prior. No evidence of displaced fracture. Polyarticular degenerative change. IMPRESSION: Streaky left basilar opacities, most likely atelectasis. Electronically Signed   By: Margaretha Sheffield M.D.   On: 08/29/2021 10:15   CT Chest Wo Contrast  Result Date: 08/29/2021 CLINICAL DATA:  Blunt chest trauma. Right breast hematoma and right rib  swelling and tenderness. No crepitus. Shortness of breath. Fluid retention in legs and face. Abdominal distention. EXAM: CT CHEST WITHOUT CONTRAST TECHNIQUE: Multidetector CT imaging of the chest was performed following the standard protocol without IV contrast. RADIATION DOSE REDUCTION: This exam was performed according to the departmental dose-optimization program which includes automated exposure control, adjustment of the mA and/or kV according to patient size and/or use of iterative reconstruction technique. COMPARISON:  Chest two views 08/29/2021 and 01/17/2021; CT abdomen and pelvis 04/22/2017 FINDINGS: Cardiovascular: Heart size is mildly to moderately enlarged. Trace pericardial fluid is seen. LAD likely two coronary artery stents. The ascending aorta measures up to 3.9 cm in caliber, high-grade ectatic but not  aneurysmal. Mild-to-moderate calcifications within the thoracic aorta. Normal caliber of the descending thoracic aorta. Mediastinum/Nodes: No axillary, mediastinal, or hilar pathologically enlarged lymph nodes by CT criteria. The visualized portion of the thyroid gland is grossly unremarkable. Mild likely normal epicardial fluid is seen. No higher density mediastinal hematoma. The esophagus follows a normal course of normal caliber. Lungs/Pleura: The central airways are patent. Mild bilateral lower lung posterior subpleural cortical thickening and interlobular septal thickening, likely a combination of subsegmental atelectasis and/or scarring. No pleural effusion or pneumothorax. No evidence of pneumonia. Upper Abdomen: The left adrenal gland is normal. Within the inferior aspect of the lateral limb of the right adrenal gland there is a nodule with internal density of 17-29 Hounsfield units (axial image 122) measuring up to 1.2 cm. This is unchanged from 04/22/2017 CT abdomen and pelvis and therefore benign. Musculoskeletal: Mild levocurvature of the mid to lower thoracic spine. Moderate multilevel  degenerative disc changes. No significant hematoma is visualized within the right breast noting a hematoma is evidenced clinically. There may be mildly increased density of the right breast tissue from soft tissue injury. IMPRESSION: 1. No noncontrast CT evidence of acute traumatic injury within the thorax. 2. Mild-to-moderate cardiomegaly.  Trace pericardial fluid. 3. 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 4. No acute lung process. 5. Mild levocurvature of the mid to lower thoracic spine with moderate multilevel degenerative disc changes. Electronically Signed   By: Yvonne Kendall M.D.   On: 08/29/2021 14:01    Procedures Procedures    Medications Ordered in ED Medications  hydrALAZINE (APRESOLINE) injection 10 mg (has no administration in time range)  labetalol (NORMODYNE) injection 10 mg (10 mg Intravenous Given 08/29/21 1404)    ED Course/ Medical Decision Making/ A&P                           Medical Decision Making Amount and/or Complexity of Data Reviewed Labs: ordered. Radiology: ordered.  Risk Prescription drug management.   81 year old female presents emergency department with right-sided chest pain.  Patient reports mechanical fall a couple days ago, bruising to the right breast and tenderness along the right ribs.  History of CAD, recent stenting, has outpatient follow-up with cardiology in about a month.  States she is otherwise been compliant with her medications.  Unclear if she took her beta-blocker this morning.  Chest x-ray is unremarkable.  Blood work is reassuring, troponin is slightly elevated at 23, but flat and unchanged with no delta.  Her pain seems to be musculoskeletal and reproducible.  I believe that this is a chronic elevation of troponin and not an indication of acute ACS.  Patient was given a dose of her home beta-blocker with minimal improvement of blood pressure.  Daughter has arrived and now reveals that the patient has  been having worsening shortness of breath even prior to the fall.  No signs of heart failure.  No hypoxia, lower suspicion for PE at this time.  No recent illness, no findings of pericarditis.  Will attempt another dose of medication for blood pressure control.  If this is not achieved then may have to consider hypertensive urgency and more aggressive treatment and possible admission.        Final Clinical Impression(s) / ED Diagnoses Final diagnoses:  None    Rx / DC Orders ED Discharge Orders     None         Judy Goodenow, Alvin Critchley,  DO 08/29/21 1525

## 2021-08-29 NOTE — ED Notes (Signed)
Patient transported to CT 

## 2021-08-29 NOTE — ED Triage Notes (Signed)
Pt daughter arrives and states that this patient has more chief complaints than previously stated by patient. SOB with exertion, fluid retention in legs and face, abd distention x3weeks. Sleeps with 1 pillow.

## 2021-08-29 NOTE — ED Triage Notes (Addendum)
R rib pain that began with mechanical fall last tues, pain worsening. Has not tried any pain medication. Denies any other injury

## 2021-08-29 NOTE — ED Notes (Addendum)
Checked pts BP right arm 222/79 checked left arm and was higher. Changed BP cuff still high.

## 2021-08-29 NOTE — ED Notes (Signed)
Pt provided water, peanut butter crackers and popchips per request. Dr. Karle Starch agreed.

## 2021-08-29 NOTE — ED Notes (Signed)
Pt's daughter to nurses station, states, "My mother is c/o chest pain, like a band around her chest" upon arrival pt c/o not being able to catch her breath. Did not endorse chest pain. RR even and unlabored, 100% RA

## 2021-08-29 NOTE — Telephone Encounter (Signed)
Our operator reports family stated they were taking patient to Urgent Care due to swelling.   Patient currently in ED at [facility]

## 2021-08-29 NOTE — ED Notes (Signed)
Patient ambulated to bathroom and back to room well.

## 2021-08-29 NOTE — ED Provider Notes (Signed)
Care of the patient assumed at the change of shift. Initially complaining of a fall, but after daughter arrived some additional complaints came to light. Workup essentially benign, but BP elevated. Given antihypertensives with plan to re-eval.  Physical Exam  BP (!) 187/76    Pulse 86    Temp 98.4 F (36.9 C) (Oral)    Resp 16    Wt 59 kg    SpO2 100%    BMI 25.39 kg/m   Physical Exam RRR CTAB Anxious/teaful Procedures  Procedures  ED Course / MDM   Clinical Course as of 08/29/21 2322  Wed Aug 29, 2021  1608 BP improving after Hydralazine however patient now complaining of a band-like pain across her chest that started after getting the medication. Will recheck. Discussed admission with patient and daughter.  [CS]  1610 EKG is unchanged.  [CS]  9150 Patient's symptoms have resolved but BP has gone back up some. Patient reluctant to be admitted but daughter has convinced her to stay for BP control and evaluation of her persistent SOB at home.  [CS]  1654 Spoke with Dr. Florene Glen, Hospitalist, who will accept for admission.  [CS]    Clinical Course User Index [CS] Truddie Hidden, MD   Medical Decision Making Problems Addressed: Fall, initial encounter: acute illness or injury Hypertension, unspecified type: chronic illness or injury with exacerbation, progression, or side effects of treatment SOB (shortness of breath): chronic illness or injury with exacerbation, progression, or side effects of treatment  Amount and/or Complexity of Data Reviewed Labs: ordered. Radiology: ordered.  Risk OTC drugs. Prescription drug management. Decision regarding hospitalization.          Truddie Hidden, MD 08/29/21 [id number]

## 2021-08-29 NOTE — Telephone Encounter (Signed)
Pt c/o swelling: STAT is pt has developed SOB within 24 hours  If swelling, where is the swelling located? All over   How much weight have you gained and in what time span? N/A  Have you gained 3 pounds in a day or 5 pounds in a week? Yes   Do you have a log of your daily weights (if so, list)? No   Are you currently taking a fluid pill? No   Are you currently SOB? Patient in different location  Have you traveled recently? No

## 2021-08-30 ENCOUNTER — Encounter (HOSPITAL_COMMUNITY): Payer: Self-pay | Admitting: Family Medicine

## 2021-08-30 ENCOUNTER — Telehealth: Payer: Self-pay | Admitting: Cardiovascular Disease

## 2021-08-30 DIAGNOSIS — Z7982 Long term (current) use of aspirin: Secondary | ICD-10-CM | POA: Diagnosis not present

## 2021-08-30 DIAGNOSIS — G4700 Insomnia, unspecified: Secondary | ICD-10-CM

## 2021-08-30 DIAGNOSIS — Z79899 Other long term (current) drug therapy: Secondary | ICD-10-CM | POA: Diagnosis not present

## 2021-08-30 DIAGNOSIS — W108XXA Fall (on) (from) other stairs and steps, initial encounter: Secondary | ICD-10-CM | POA: Diagnosis not present

## 2021-08-30 DIAGNOSIS — I160 Hypertensive urgency: Secondary | ICD-10-CM | POA: Diagnosis not present

## 2021-08-30 DIAGNOSIS — Z7902 Long term (current) use of antithrombotics/antiplatelets: Secondary | ICD-10-CM | POA: Diagnosis not present

## 2021-08-30 DIAGNOSIS — I252 Old myocardial infarction: Secondary | ICD-10-CM | POA: Diagnosis not present

## 2021-08-30 DIAGNOSIS — R42 Dizziness and giddiness: Secondary | ICD-10-CM

## 2021-08-30 DIAGNOSIS — E119 Type 2 diabetes mellitus without complications: Secondary | ICD-10-CM | POA: Diagnosis not present

## 2021-08-30 DIAGNOSIS — Z20822 Contact with and (suspected) exposure to covid-19: Secondary | ICD-10-CM | POA: Diagnosis not present

## 2021-08-30 DIAGNOSIS — I2510 Atherosclerotic heart disease of native coronary artery without angina pectoris: Secondary | ICD-10-CM | POA: Diagnosis not present

## 2021-08-30 DIAGNOSIS — I700 Atherosclerosis of aorta: Secondary | ICD-10-CM | POA: Diagnosis present

## 2021-08-30 DIAGNOSIS — R0602 Shortness of breath: Secondary | ICD-10-CM | POA: Diagnosis present

## 2021-08-30 DIAGNOSIS — Z7984 Long term (current) use of oral hypoglycemic drugs: Secondary | ICD-10-CM | POA: Diagnosis not present

## 2021-08-30 DIAGNOSIS — I10 Essential (primary) hypertension: Secondary | ICD-10-CM | POA: Diagnosis not present

## 2021-08-30 HISTORY — DX: Atherosclerotic heart disease of native coronary artery without angina pectoris: I25.10

## 2021-08-30 LAB — GLUCOSE, CAPILLARY
Glucose-Capillary: 204 mg/dL — ABNORMAL HIGH (ref 70–99)
Glucose-Capillary: 149 mg/dL — ABNORMAL HIGH (ref 70–99)

## 2021-08-30 LAB — CBG MONITORING, ED: Glucose-Capillary: 156 mg/dL — ABNORMAL HIGH (ref 70–99)

## 2021-08-30 MED ORDER — ONDANSETRON HCL 4 MG PO TABS: 4.0000 mg | ORAL_TABLET | Freq: Four times a day (QID) | ORAL | Status: DC | PRN

## 2021-08-30 MED ORDER — AMLODIPINE BESYLATE 5 MG PO TABS
5.0000 mg | ORAL_TABLET | Freq: Every day | ORAL | Status: DC
  Administered 2021-08-30 – 2021-08-31 (×2): 5 mg via ORAL
  Filled 2021-08-30 (×2): qty 1

## 2021-08-30 MED ORDER — ACETAMINOPHEN 500 MG PO TABS: 1000.0000 mg | ORAL_TABLET | Freq: Four times a day (QID) | ORAL | Status: DC | PRN

## 2021-08-30 MED ORDER — ENOXAPARIN SODIUM 40 MG/0.4ML IJ SOSY: 40.0000 mg | PREFILLED_SYRINGE | INTRAMUSCULAR | Status: DC

## 2021-08-30 MED ORDER — TRAZODONE HCL 50 MG PO TABS
25.0000 mg | ORAL_TABLET | Freq: Every day | ORAL | Status: DC
  Administered 2021-08-30: 25 mg via ORAL
  Filled 2021-08-30: qty 1

## 2021-08-30 MED ORDER — TICAGRELOR 90 MG PO TABS: 90.0000 mg | ORAL_TABLET | Freq: Two times a day (BID) | ORAL | Status: DC

## 2021-08-30 MED ORDER — CLOPIDOGREL BISULFATE 75 MG PO TABS
75.0000 mg | ORAL_TABLET | Freq: Every day | ORAL | Status: DC
  Administered 2021-08-30 – 2021-08-31 (×2): 75 mg via ORAL
  Filled 2021-08-30 (×2): qty 1

## 2021-08-30 MED ORDER — ACETAMINOPHEN 500 MG PO TABS
500.0000 mg | ORAL_TABLET | Freq: Every day | ORAL | Status: DC
  Administered 2021-08-30: 500 mg via ORAL
  Filled 2021-08-30: qty 1

## 2021-08-30 MED ORDER — HYDRALAZINE HCL 25 MG PO TABS
25.0000 mg | ORAL_TABLET | Freq: Four times a day (QID) | ORAL | Status: DC | PRN
  Administered 2021-08-30 (×2): 25 mg via ORAL
  Filled 2021-08-30 (×2): qty 1

## 2021-08-30 MED ORDER — ACETAMINOPHEN 325 MG PO TABS: 650.0000 mg | ORAL_TABLET | Freq: Four times a day (QID) | ORAL | Status: DC | PRN

## 2021-08-30 MED ORDER — ACETAMINOPHEN 650 MG RE SUPP: 650.0000 mg | Freq: Four times a day (QID) | RECTAL | Status: DC | PRN

## 2021-08-30 MED ORDER — DIPHENHYDRAMINE HCL 25 MG PO CAPS
25.0000 mg | ORAL_CAPSULE | Freq: Every day | ORAL | Status: DC
  Administered 2021-08-30: 25 mg via ORAL
  Filled 2021-08-30: qty 1

## 2021-08-30 MED ORDER — ASPIRIN EC 81 MG PO TBEC
81.0000 mg | DELAYED_RELEASE_TABLET | Freq: Every day | ORAL | Status: DC
  Administered 2021-08-30: 81 mg via ORAL
  Filled 2021-08-30: qty 1

## 2021-08-30 MED ORDER — ATORVASTATIN CALCIUM 40 MG PO TABS
80.0000 mg | ORAL_TABLET | Freq: Every day | ORAL | Status: DC
  Administered 2021-08-30 – 2021-08-31 (×2): 80 mg via ORAL
  Filled 2021-08-30 (×2): qty 2

## 2021-08-30 MED ORDER — NITROGLYCERIN 0.4 MG SL SUBL: 0.4000 mg | SUBLINGUAL_TABLET | SUBLINGUAL | Status: DC | PRN

## 2021-08-30 MED ORDER — METFORMIN HCL ER 750 MG PO TB24: 750.0000 mg | ORAL_TABLET | Freq: Every evening | ORAL | Status: DC

## 2021-08-30 MED ORDER — ONDANSETRON HCL 4 MG/2ML IJ SOLN: 4.0000 mg | Freq: Four times a day (QID) | INTRAMUSCULAR | Status: DC | PRN

## 2021-08-30 MED ORDER — DIPHENHYDRAMINE-APAP (SLEEP) 25-500 MG PO TABS: 1.0000 | ORAL_TABLET | Freq: Every day | ORAL | Status: DC

## 2021-08-30 MED ORDER — VENLAFAXINE HCL ER 150 MG PO CP24
150.0000 mg | ORAL_CAPSULE | Freq: Every day | ORAL | Status: DC
  Administered 2021-08-31: 150 mg via ORAL
  Filled 2021-08-30: qty 1

## 2021-08-30 NOTE — ED Notes (Signed)
Pt's CBG Result was 156. Informed Joss-RN.

## 2021-08-30 NOTE — Telephone Encounter (Signed)
Spoke with [place] and advised she is not on the [facility].  She states they're concerned with pt's BP issues and her being sent home.  She said "one minute they're telling her that if they can't get her BP down she's going to have a stroke and the next they're talking about sending her home".  Advised again, I unfortunately can't discuss pt's personal health information with her but if they are concerned to discuss this with doctor at ED about keeping her admitted.  She asked that I call her aunt, Donnie Aho, who is on the [facility].  Called Olivia Mackie and left message to call back or discuss concerns with ED doctor.

## 2021-08-30 NOTE — ED Provider Notes (Signed)
8 AM: Patient is well-appearing.  On history she seems to have no signs of endorgan damage with no vision changes, chest pain, weakness/numbness.  She had a headache earlier but that is gone now.  Kidney function is okay.  She is not sure what her blood pressure runs at home.  She is only on carvedilol for blood pressure at home.  I will add Norvasc.  She has no leg swelling right now.  Discussed options with patient which includes going home and titrating blood pressure medicines as an outpatient given no endorgan damage.  She wants to discuss this with her daughter.  Otherwise we will continue admission for now.  9:04 AM Discussed with Dr. Olevia Bowens at [facility] given continued boarding. He agrees with some norvasc, and can add po hydralazine prn SBP>160. Otherwise she remains stable.    Sherwood Gambler, MD 08/30/21 1540

## 2021-08-30 NOTE — H&P (Signed)
History and Physical    Patient: Elizabeth Mcmillan MAU:[id number] DOB: [birth year] DOA: 08/29/2021 DOS: the patient was seen and examined on 08/30/2021 PCP: Donnamarie Rossetti, PA-C  Patient coming from: Home  Chief Complaint:  Chief Complaint  Patient presents with   Fall    HPI: Elizabeth Mcmillan is a 81 y.o. female with medical history significant of anxiety, CAD, type II DM, GERD, hyperlipidemia, hypertension, CAD, NSTEMI who was seen at [facility] emergency department due to right-sided chest wall pain.  The patient stated that about a week ago she had a mechanical fall while she was going down the outside stairs with her dog.  Her pain was getting better until yesterday morning got very intense.  She was dyspneic, denied precordial chest pain, nausea, emesis, palpitations, diaphoresis, PND, orthopnea or recent pitting edema of the lower extremities.  She has had 2 other falls in the last 6 weeks.  On Christmas Eve was the worst of these two fall, she fell, hit her head, not sure if she lost consciousness or not, but her memory was not the same for the next 2 to 3 days.  Her daughter also added that she has been very dyspneic on exertion.  She has been occasional positional lightheadedness at times.  Denies fever, sore throat, rhinorrhea, productive cough, wheezing or hemoptysis.  No abdominal pain, diarrhea, constipation, melena or hematochezia.  She occasionally gets frequency and nocturia, but no flank pain, dysuria or hematuria.  No polyuria, polydipsia, polyphagia or blurred vision.  She has frequent insomnia, but drinks coffee in the morning and 1 to 3 cans of diet Pepsi the rest of the day, usually with meals.  ED: Initial vital signs were temperature 98.4 F, pulse 85, respiration 18, BP 170/100 mmHg O2 sat 100% on room air.  The patient was started on amlodipine while in the emergency department and her regular medications were continued.  Lab work: Her urinalysis showed proteinuria 30 mg per  the sitter, but was otherwise unremarkable.  Troponin was 22, followed by 23 and then 22 ng/L.  BNP 104.5 pg/mL.  CBC showed a white count 11.2, hemoglobin 12.0 g/dL platelets 225.  CMP showed a glucose of 106 and BUN of 26 mg/dL, the rest of the CMP measurements were unremarkable.  Imaging: Two-view chest radiograph showed left atelectasis.  CT and CTA chest showed mild to moderate cardiomegaly.  There was ectatic and borderline on the real small ascending aorta measuring up to 3.9 cm on noncontrast chest CT, but is not described on CTA chest (there was ascending aortic dilatation on echo in July/22).  There was aortic atherosclerosis.  No evidence of pulmonary embolism.  Review of Systems: As mentioned in the history of present illness. All other systems reviewed and are negative. Past Medical History:  Diagnosis Date   Anxiety    CAD (coronary artery disease) 08/30/2021   Diabetes mellitus without complication (HCC)    GERD (gastroesophageal reflux disease)    Hypercholesteremia    Hypertension    NSTEMI (non-ST elevated myocardial infarction) ([place]) 01/17/2021   Past Surgical History:  Procedure Laterality Date   ABDOMINAL HYSTERECTOMY     BREAST EXCISIONAL BIOPSY Left years ago   negative   COLON SURGERY     COLONOSCOPY WITH PROPOFOL N/A 09/04/2016   Procedure: COLONOSCOPY WITH PROPOFOL;  Surgeon: Manya Silvas, MD;  Location: [place];  Service: Endoscopy;  Laterality: N/A;   CORONARY STENT INTERVENTION N/A 01/18/2021   Procedure: CORONARY STENT INTERVENTION;  Surgeon: Lorretta Harp, MD;  Location: [place] [facility];  Service: Cardiovascular;  Laterality: N/A;   INTRAVASCULAR ULTRASOUND/IVUS N/A 01/18/2021   Procedure: Intravascular Ultrasound/IVUS;  Surgeon: Lorretta Harp, MD;  Location: [place] [facility];  Service: Cardiovascular;  Laterality: N/A;   LEFT HEART CATH AND CORONARY ANGIOGRAPHY N/A 01/18/2021   Procedure: LEFT HEART CATH AND CORONARY ANGIOGRAPHY;  Surgeon:  Lorretta Harp, MD;  Location: [place] [facility];  Service: Cardiovascular;  Laterality: N/A;   LUMBAR LAMINECTOMY/ DECOMPRESSION WITH MET-RX Right 05/09/2015   Procedure: Far Lateral Lumbar Three-Four Microdiscectomy WITH MET-RX;  Surgeon: Erline Levine, MD;  Location: [place] [facility];  Service: Neurosurgery;  Laterality: Right;   Social History:  reports that she has never smoked. She has never used smokeless tobacco. She reports that she does not drink alcohol and does not use drugs.  No Known Allergies  Family History  Problem Relation Age of Onset   Heart attack Father    Stroke Father    Coronary artery disease Father    Coronary artery disease Paternal Grandfather    Heart attack Paternal Grandfather    Breast cancer Neg Hx     Prior to Admission medications   Medication Sig Start Date End Date Taking? Authorizing Provider  aspirin EC 81 MG tablet Take 81 mg by mouth at bedtime.   Yes [provider]  atorvastatin (LIPITOR) 80 MG tablet Take 1 tablet (80 mg total) by mouth daily. 05/21/21  Yes Sherren Mocha, MD  carvedilol (COREG) 12.5 MG tablet Take 1 tablet (12.5 mg total) by mouth 2 (two) times daily with a meal. 02/02/21  Yes Loel Dubonnet, NP  clopidogrel (PLAVIX) 75 MG tablet Take 1 tablet (75 mg total) by mouth daily. The 1st day of medication, take 4 tabs ($Remov'300mg'dyHFgc$ ) loading dose. Then take 1 tab ($Remo'75mg'mYkDP$ ) daily 02/16/21  Yes Loel Dubonnet, NP  diphenhydramine-acetaminophen (TYLENOL PM) 25-500 MG TABS tablet Take 1 tablet by mouth at bedtime.   Yes [provider]  metFORMIN (GLUCOPHAGE-XR) 750 MG 24 hr tablet Take 750 mg by mouth every evening. 08/19/21  Yes [provider]  ticagrelor (BRILINTA) 90 MG TABS tablet Take 90 mg by mouth every 12 (twelve) hours.   Yes [provider]  venlafaxine XR (EFFEXOR-XR) 150 MG 24 hr capsule Take 150 mg by mouth daily.   Yes [provider]  acetaminophen (TYLENOL) 500 MG tablet Take 1,000  mg by mouth every 6 (six) hours as needed (pain).    [provider]  nitroGLYCERIN (NITROSTAT) 0.4 MG SL tablet Place 1 tablet (0.4 mg total) under the tongue every 5 (five) minutes as needed. 01/19/21   Cheryln Manly, NP    Physical Exam: Vitals:   08/30/21 1200 08/30/21 1230 08/30/21 1259 08/30/21 1356  BP: (!) 184/64 (!) 169/74  (!) 170/78  Pulse: 77 78  84  Resp: $Remo'16 15  16  'YLWhc$ Temp:   98.4 F (36.9 C) 99.1 F (37.3 C)  TempSrc:   Oral Oral  SpO2: 97% 100%  98%  Weight:       Physical Exam Constitutional:      Appearance: Normal appearance.  HENT:     Head: Normocephalic and atraumatic.     Mouth/Throat:     Mouth: Mucous membranes are moist.  Eyes:     Pupils: Pupils are equal, round, and reactive to light.  Cardiovascular:     Rate and Rhythm: Normal rate and regular rhythm.  Heart sounds: No murmur heard. Pulmonary:     Effort: Pulmonary effort is normal.     Breath sounds: Normal breath sounds.  Abdominal:     General: Bowel sounds are normal. There is no distension.     Palpations: Abdomen is soft.     Tenderness: There is no abdominal tenderness.  Musculoskeletal:     Cervical back: Neck supple.  Skin:    General: Skin is warm and dry.  Neurological:     General: No focal deficit present.     Mental Status: She is alert and oriented to person, place, and time.  Psychiatric:        Mood and Affect: Mood normal.        Behavior: Behavior normal.     Data Reviewed:  There are no new results to review at this time.  01/19/2021 echocardiogram IMPRESSIONS    1. Left ventricular ejection fraction, by estimation, is 50 to 55%. The  left ventricle has low normal function. The left ventricle demonstrates  regional wall motion abnormalities (see scoring diagram/findings for  description). Left ventricular diastolic   parameters are consistent with Grade I diastolic dysfunction (impaired  relaxation). There is severe akinesis of the left  ventricular, mid-apical  anteroseptal wall and apical segment.   2. Right ventricular systolic function is normal. The right ventricular  size is normal.   3. Left atrial size was mildly dilated.   4. The mitral valve is normal in structure. Mild mitral valve  regurgitation.   5. The aortic valve is normal in structure. Aortic valve regurgitation is  mild to moderate.   6. Aortic dilatation noted. There is moderate dilatation of the ascending  aorta, measuring 40 mm.  Assessment and Plan: Principal Problem:   Hypertensive urgency   Hypertension Observation/telemetry. Continue carvedilol 12.5 mg twice a day. Started on amlodipine this morning. Continue amlodipine 5 mg p.o. daily. Monitor blood pressure and heart rate.  Active Problems:   Dizziness Check carotid Doppler.    Type 2 diabetes mellitus (HCC) Carbohydrate modified diet. Continue metformin 750 mg p.o. every evening. CBG monitoring before meals and bedtime. Check hemoglobin A1c in the morning.    CAD (coronary artery disease) Continue aspirin, clopidogrel and ticagrelor Continue carvedilol 12.5 mg p.o. twice daily. Continue atorvastatin 80 mg p.o. daily.    Aortic atherosclerosis   Hypercholesteremia Discussed briefly with the patient and daughter. Continue atorvastatin.    Insomnia Advised to decrease caffeine intake. Advised to only drink coffee in the morning. Continue Tylenol PM. Trial of low dose trazodone at bedtime.    Advance Care Planning:   Code Status: Full Code   Consults:   Family Communication: Her daughter was at bedside.  Severity of Illness: The appropriate patient status for this patient is OBSERVATION. Observation status is judged to be reasonable and necessary in order to provide the required intensity of service to ensure the patient's safety. The patient's presenting symptoms, physical exam findings, and initial radiographic and laboratory data in the context of their medical  condition is felt to place them at decreased risk for further clinical deterioration. Furthermore, it is anticipated that the patient will be medically stable for discharge from the hospital within 2 midnights of admission.   Author: Reubin Milan, MD 08/30/2021 3:47 PM  For on call review [email or web address].

## 2021-08-30 NOTE — Telephone Encounter (Signed)
[place] patient's granddaughter called stating patient has been at the emergency room since yesterday with SOB, swelling and her BP in the 200's.  They were finally able to get her BP down to 164 after trying 3 different medications.  Granddaughter states they are thinking about sending her home, they are not comfortable with that, they are concerned because she had a heart attack last year and had stents placed.  She is still having SOB and swelling and her BP is still high.  She wanted to make sure Dr. Burt Knack was aware of what is going on.

## 2021-08-31 ENCOUNTER — Observation Stay (HOSPITAL_BASED_OUTPATIENT_CLINIC_OR_DEPARTMENT_OTHER): Payer: Medicare Other

## 2021-08-31 DIAGNOSIS — R42 Dizziness and giddiness: Secondary | ICD-10-CM

## 2021-08-31 DIAGNOSIS — I160 Hypertensive urgency: Secondary | ICD-10-CM | POA: Diagnosis not present

## 2021-08-31 DIAGNOSIS — R0602 Shortness of breath: Secondary | ICD-10-CM | POA: Diagnosis not present

## 2021-08-31 LAB — HEMOGLOBIN A1C
Hgb A1c MFr Bld: 6.9 % — ABNORMAL HIGH (ref 4.8–5.6)
Mean Plasma Glucose: 151.33 mg/dL

## 2021-08-31 LAB — GLUCOSE, CAPILLARY
Glucose-Capillary: 147 mg/dL — ABNORMAL HIGH (ref 70–99)
Glucose-Capillary: 154 mg/dL — ABNORMAL HIGH (ref 70–99)

## 2021-08-31 MED ORDER — LOPERAMIDE HCL 2 MG PO CAPS: 2.0000 mg | ORAL_CAPSULE | ORAL | 0 refills | Status: DC | PRN

## 2021-08-31 MED ORDER — LOPERAMIDE HCL 2 MG PO CAPS: 2.0000 mg | ORAL_CAPSULE | ORAL | Status: DC | PRN

## 2021-08-31 MED ORDER — AMLODIPINE BESYLATE 5 MG PO TABS: 5.0000 mg | ORAL_TABLET | Freq: Every day | ORAL | 0 refills | Status: DC

## 2021-08-31 NOTE — Hospital Course (Addendum)
81 y.o. f w/ history significant for anxiety, CAD, type II DM, GERD, HLD HTN, CAD/NSTEMI who was seen at [facility] ED 2/2 right-sided chest wall pain, about a week ago had a mechanical fall while she was going down the outside stairs with her dog.  Her pain was getting better until 2/8 morning got very intense.  She was dyspneic, denied precordial chest pain, nausea, emesis, palpitations, diaphoresis, PND, orthopnea or recent pitting edema of the lower extremities.  She has had 2 other falls in the last 6 weeks.  Per daughter she has been very dyspneic on exertion.  She has been occasional positional lightheadedness at times.  ED: BP 170/100 mmHg O2 sat 100% on room air,started on amlodipine In ED   Lab work:  UAshowed proteinuria 30 mg per the sitter, but was otherwise unremarkable. Troponin was 22>23>22 ng/L.  BNP 104.5 CBC  stable mildly up wbc 11.2  Imaging: cxr left atelectasis.  CT and CTA chest showed mild to moderate cardiomegaly.  There was ectatic and borderline on the real small ascending aorta measuring up to 3.9 cm on noncontrast chest CT, but is not described on CTA chest (there was ascending aortic dilatation on echo in July/22).  There was aortic atherosclerosis.  No evidence of pulmonary embolism. Patient was admitted for hypertension urgency dizziness insomnia. Underwent carotid ultrasound that showed no  significant stenosis A1c stable 6.9.  Blood pressure fairly stable in 160s.  Remains asymptomatic, eager to go home.  Discussed plan of care with the daughter at the bedside who will monitor blood pressure at home and follow-up with PCP.

## 2021-08-31 NOTE — TOC Transition Note (Signed)
Transition of Care [facility]) - CM/SW Discharge Note   Patient Details  Name: Elizabeth Mcmillan MRN: [id number] Date of Birth: 06-16-1941  Transition of Care [facility]) CM/SW Contact:  Leeroy Cha, RN Phone Number: 08/31/2021, 12:39 PM   Clinical Narrative:    Patient in vascular lab will see after procedure.  Has been dcd to home post procedure.   Final next level of care: Home/Self Care Barriers to Discharge: No Barriers Identified   Patient Goals and CMS Choice Patient states their goals for this hospitalization and ongoing recovery are:: to go home CMS Medicare.gov Compare Post Acute Care list provided to:: Patient    Discharge Placement                       Discharge Plan and Services   Discharge Planning Services: CM Consult                                 Social Determinants of Health (SDOH) Interventions     Readmission Risk Interventions No flowsheet data found.

## 2021-08-31 NOTE — Progress Notes (Signed)
Patient and family given discharge, follow up, and medication instructions, verbalized understanding, IV and telemetry monitor removed, personal belongings with patient, family to transport home

## 2021-08-31 NOTE — Progress Notes (Signed)
Carotid artery duplex has been completed. Preliminary results can be found in CV Proc through chart review.   08/31/21 9:40 AM Elizabeth Mcmillan RVT

## 2021-08-31 NOTE — Discharge Summary (Signed)
Physician Discharge Summary   Patient: Elizabeth Mcmillan MRN: [id number] DOB: 22-Dec-1940  Admit date:     08/29/2021  Discharge date: 08/31/21  Discharge Physician: Antonieta Pert   PCP: Donnamarie Rossetti, PA-C   Recommendations at discharge:   Follow-up with PCP to monitor blood pressure in a week Monitor blood pressure every day at home  Discharge Diagnoses:  Hypertensive urgency: Fairly improved BP.Continue with new medication amlodipine, home Coreg, follow-up with PCP.  Regularly monitor blood pressure at home  Other issues are chronic and stable Type 2 diabetes mellitus -stable A1c.  Continue home meds Hypercholesteremia CAD-no chest pain Troponin borderline up no delta no chest pain on the left side.  Likely from hypertensive urgency.  She is already on aspirin Plavix and statin. Aortic atherosclerosis Dizziness-resolved.  Carotid ultrasound stable.  She has been up and ambulatory in the room without any issues. Insomnia  Resolved Problems:   * No resolved hospital problems. *   Hospital Course: 81 y.o. f w/ history significant for anxiety, CAD, type II DM, GERD, HLD HTN, CAD/NSTEMI who was seen at [facility] ED 2/2 right-sided chest wall pain, about a week ago had a mechanical fall while she was going down the outside stairs with her dog.  Her pain was getting better until 2/8 morning got very intense.  She was dyspneic, denied precordial chest pain, nausea, emesis, palpitations, diaphoresis, PND, orthopnea or recent pitting edema of the lower extremities.  She has had 2 other falls in the last 6 weeks.  Per daughter she has been very dyspneic on exertion.  She has been occasional positional lightheadedness at times.  ED: BP 170/100 mmHg O2 sat 100% on room air,started on amlodipine In ED   Lab work:  UAshowed proteinuria 30 mg per the sitter, but was otherwise unremarkable. Troponin was 22>23>22 ng/L.  BNP 104.5 CBC  stable mildly up wbc 11.2  Imaging: cxr left atelectasis.  CT and  CTA chest showed mild to moderate cardiomegaly.  There was ectatic and borderline on the real small ascending aorta measuring up to 3.9 cm on noncontrast chest CT, but is not described on CTA chest (there was ascending aortic dilatation on echo in July/22).  There was aortic atherosclerosis.  No evidence of pulmonary embolism. Patient was admitted for hypertension urgency dizziness insomnia. Underwent carotid ultrasound that showed no  significant stenosis A1c stable 6.9.  Blood pressure fairly stable in 160s.  Remains asymptomatic, eager to go home.  Discussed plan of care with the daughter at the bedside who will monitor blood pressure at home and follow-up with PCP.      Consultants: none Procedures performed: none  Disposition: home Diet recommendation:  Diet Orders (From admission, onward)     Start     Ordered   08/30/21 1447  Diet heart healthy/carb modified Room service appropriate? Yes; Fluid consistency: Thin  Diet effective now       Question Answer Comment  Diet-HS Snack? Nothing   Room service appropriate? Yes   Fluid consistency: Thin      08/30/21 1446             DISCHARGE MEDICATION: Allergies as of 08/31/2021   No Known Allergies      Medication List     STOP taking these medications    ticagrelor 90 MG Tabs tablet Commonly known as: BRILINTA       TAKE these medications    acetaminophen 500 MG tablet Commonly known as: TYLENOL Take 1,000 mg  by mouth every 6 (six) hours as needed (pain).   amLODipine 5 MG tablet Commonly known as: NORVASC Take 1 tablet (5 mg total) by mouth daily. Start taking on: September 01, 2021   aspirin EC 81 MG tablet Take 81 mg by mouth at bedtime.   atorvastatin 80 MG tablet Commonly known as: LIPITOR Take 1 tablet (80 mg total) by mouth daily.   carvedilol 12.5 MG tablet Commonly known as: COREG Take 1 tablet (12.5 mg total) by mouth 2 (two) times daily with a meal.   clopidogrel 75 MG tablet Commonly  known as: PLAVIX Take 1 tablet (75 mg total) by mouth daily. The 1st day of medication, take 4 tabs (300mg ) loading dose. Then take 1 tab (75mg ) daily   diphenhydramine-acetaminophen 25-500 MG Tabs tablet Commonly known as: TYLENOL PM Take 1 tablet by mouth at bedtime.   loperamide 2 MG capsule Commonly known as: IMODIUM Take 1 capsule (2 mg total) by mouth as needed for diarrhea or loose stools.   metFORMIN 750 MG 24 hr tablet Commonly known as: GLUCOPHAGE-XR Take 750 mg by mouth every evening.   nitroGLYCERIN 0.4 MG SL tablet Commonly known as: Nitrostat Place 1 tablet (0.4 mg total) under the tongue every 5 (five) minutes as needed.   venlafaxine XR 150 MG 24 hr capsule Commonly known as: EFFEXOR-XR Take 150 mg by mouth daily.        Follow-up Information     Grayland Ormond Hestle, PA-C Follow up in 1 week(s).   Specialty: Family Medicine Contact information: [place] [address] [phone]         Sherren Mocha, MD .   Specialty: Cardiology Contact information: [address] [place] [address] [phone]                 Discharge Exam: Danley Danker Weights   08/29/21 0948  Weight: 59 kg  Condition at discharge: stable  The results of significant diagnostics from this hospitalization (including imaging, microbiology, ancillary and laboratory) are listed below for reference.   Imaging Studies: DG Chest 2 View  Result Date: 08/29/2021 CLINICAL DATA:  pain in chest wall from fall EXAM: CHEST - 2 VIEW COMPARISON:  January 17, 2021. FINDINGS: Streaky left basilar opacities. No confluent consolidation. No visible pleural effusions or pneumothorax. Biapical pleuroparenchymal scarring. Cardiomediastinal silhouette is within normal limits and similar to prior. No evidence of displaced fracture. Polyarticular degenerative change. IMPRESSION: Streaky left basilar opacities, most likely atelectasis. Electronically Signed    By: Margaretha Sheffield M.D.   On: 08/29/2021 10:15   CT Chest Wo Contrast  Result Date: 08/29/2021 CLINICAL DATA:  Blunt chest trauma. Right breast hematoma and right rib swelling and tenderness. No crepitus. Shortness of breath. Fluid retention in legs and face. Abdominal distention. EXAM: CT CHEST WITHOUT CONTRAST TECHNIQUE: Multidetector CT imaging of the chest was performed following the standard protocol without IV contrast. RADIATION DOSE REDUCTION: This exam was performed according to the departmental dose-optimization program which includes automated exposure control, adjustment of the mA and/or kV according to patient size and/or use of iterative reconstruction technique. COMPARISON:  Chest two views 08/29/2021 and 01/17/2021; CT abdomen and pelvis 04/22/2017 FINDINGS: Cardiovascular: Heart size is mildly to moderately enlarged. Trace pericardial fluid is seen. LAD likely two coronary artery stents. The ascending aorta measures up to 3.9 cm in caliber, high-grade ectatic but not aneurysmal. Mild-to-moderate calcifications within the thoracic aorta. Normal caliber of the descending thoracic aorta. Mediastinum/Nodes: No  axillary, mediastinal, or hilar pathologically enlarged lymph nodes by CT criteria. The visualized portion of the thyroid gland is grossly unremarkable. Mild likely normal epicardial fluid is seen. No higher density mediastinal hematoma. The esophagus follows a normal course of normal caliber. Lungs/Pleura: The central airways are patent. Mild bilateral lower lung posterior subpleural cortical thickening and interlobular septal thickening, likely a combination of subsegmental atelectasis and/or scarring. No pleural effusion or pneumothorax. No evidence of pneumonia. Upper Abdomen: The left adrenal gland is normal. Within the inferior aspect of the lateral limb of the right adrenal gland there is a nodule with internal density of 17-29 Hounsfield units (axial image 122) measuring up to 1.2  cm. This is unchanged from 04/22/2017 CT abdomen and pelvis and therefore benign. Musculoskeletal: Mild levocurvature of the mid to lower thoracic spine. Moderate multilevel degenerative disc changes. No significant hematoma is visualized within the right breast noting a hematoma is evidenced clinically. There may be mildly increased density of the right breast tissue from soft tissue injury. IMPRESSION: 1. No noncontrast CT evidence of acute traumatic injury within the thorax. 2. Mild-to-moderate cardiomegaly.  Trace pericardial fluid. 3. 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 4. No acute lung process. 5. Mild levocurvature of the mid to lower thoracic spine with moderate multilevel degenerative disc changes. Electronically Signed   By: Yvonne Kendall M.D.   On: 08/29/2021 14:01   CT Angio Chest PE W/Cm &/Or Wo Cm  Result Date: 08/29/2021 CLINICAL DATA:  Shortness of breath and elevated D-dimer. Fluid retention. EXAM: CT ANGIOGRAPHY CHEST WITH CONTRAST TECHNIQUE: Multidetector CT imaging of the chest was performed using the standard protocol during bolus administration of intravenous contrast. Multiplanar CT image reconstructions and MIPs were obtained to evaluate the vascular anatomy. RADIATION DOSE REDUCTION: This exam was performed according to the departmental dose-optimization program which includes automated exposure control, adjustment of the mA and/or kV according to patient size and/or use of iterative reconstruction technique. CONTRAST:  25mL OMNIPAQUE IOHEXOL 350 MG/ML SOLN COMPARISON:  CT chest from same day. FINDINGS: Cardiovascular: Satisfactory opacification of the pulmonary arteries to the segmental level. No evidence of pulmonary embolism. Unchanged mild cardiomegaly. No pericardial effusion. No thoracic aortic aneurysm or dissection. Coronary, aortic arch, and branch vessel atherosclerotic vascular disease. Mediastinum/Nodes: No enlarged mediastinal, hilar, or  axillary lymph nodes. Thyroid gland, trachea, and esophagus demonstrate no significant findings. Lungs/Pleura: No focal consolidation, pleural effusion, or pneumothorax. Mild subsegmental atelectasis in the lingula and posterior right lower lobe. Upper Abdomen: No acute abnormality. Unchanged small right adrenal adenoma. Musculoskeletal: No chest wall abnormality. No acute or significant osseous findings. Review of the MIP images confirms the above findings. IMPRESSION: 1. No evidence of pulmonary embolism. No acute intrathoracic process. 2. Aortic Atherosclerosis (ICD10-I70.0). Electronically Signed   By: Titus Dubin M.D.   On: 08/29/2021 19:42   VAS US CAROTID  Result Date: 08/31/2021 Carotid Arterial Duplex Study Patient Name:  Elizabeth Mcmillan  Date of Exam:   08/31/2021 Medical Rec #: [id number]        Accession #:    [id number] Date of Birth: [birth year]        Patient Gender: F Patient Age:   89 years Exam Location:  [facility] Procedure:      VAS US CAROTID Referring Phys: DAVID ORTIZ --------------------------------------------------------------------------------  Indications:       Dizziness. Risk Factors:      Hypertension, Diabetes. Comparison Study:  No prior studies. Performing Technologist: Oliver Hum  RVT  Examination Guidelines: A complete evaluation includes B-mode imaging, spectral Doppler, color Doppler, and power Doppler as needed of all accessible portions of each vessel. Bilateral testing is considered an integral part of a complete examination. Limited examinations for reoccurring indications may be performed as noted.  Right Carotid Findings: +----------+--------+--------+--------+-----------------------+--------+             PSV cm/s EDV cm/s Stenosis Plaque Description      Comments  +----------+--------+--------+--------+-----------------------+--------+  CCA Prox   79       13                smooth and heterogenous            +----------+--------+--------+--------+-----------------------+--------+  CCA Distal 65       13                smooth and heterogenous           +----------+--------+--------+--------+-----------------------+--------+  ICA Prox   83       20                smooth and heterogenous           +----------+--------+--------+--------+-----------------------+--------+  ICA Distal 103      19                                        tortuous  +----------+--------+--------+--------+-----------------------+--------+  ECA        130      11                                                  +----------+--------+--------+--------+-----------------------+--------+ +----------+--------+-------+--------+-------------------+             PSV cm/s EDV cms Describe Arm Pressure (mmHG)  +----------+--------+-------+--------+-------------------+  Subclavian 78                                             +----------+--------+-------+--------+-------------------+ +---------+--------+--+--------+--+---------+  Vertebral PSV cm/s 72 EDV cm/s 17 Antegrade  +---------+--------+--+--------+--+---------+  Left Carotid Findings: +----------+--------+--------+--------+-----------------------+--------+             PSV cm/s EDV cm/s Stenosis Plaque Description      Comments  +----------+--------+--------+--------+-----------------------+--------+  CCA Prox   84       17                smooth and heterogenous           +----------+--------+--------+--------+-----------------------+--------+  CCA Distal 77       14                smooth and heterogenous           +----------+--------+--------+--------+-----------------------+--------+  ICA Prox   76       20                smooth and heterogenous           +----------+--------+--------+--------+-----------------------+--------+  ICA Distal 94       22  tortuous  +----------+--------+--------+--------+-----------------------+--------+  ECA        65       7                                                    +----------+--------+--------+--------+-----------------------+--------+ +----------+--------+--------+--------+-------------------+             PSV cm/s EDV cm/s Describe Arm Pressure (mmHG)  +----------+--------+--------+--------+-------------------+  Subclavian 163                                             +----------+--------+--------+--------+-------------------+ +---------+--------+--+--------+--+---------+  Vertebral PSV cm/s 58 EDV cm/s 11 Antegrade  +---------+--------+--+--------+--+---------+   Summary: Right Carotid: Velocities in the right ICA are consistent with a 1-39% stenosis. Left Carotid: Velocities in the left ICA are consistent with a 1-39% stenosis. Vertebrals: Bilateral vertebral arteries demonstrate antegrade flow. *See table(s) above for measurements and observations.     Preliminary     Microbiology: Results for orders placed or performed during the hospital encounter of 08/29/21  Resp Panel by RT-PCR (Flu A&B, Covid) Nasopharyngeal Swab     Status: None   Collection Time: 08/29/21  5:18 PM   Specimen: Nasopharyngeal Swab; Nasopharyngeal(NP) swabs in vial transport medium  Result Value Ref Range Status   SARS Coronavirus 2 by RT PCR NEGATIVE NEGATIVE Final    Comment: (NOTE) SARS-CoV-2 target nucleic acids are NOT DETECTED.  The SARS-CoV-2 RNA is generally detectable in upper respiratory specimens during the acute phase of infection. The lowest concentration of SARS-CoV-2 viral copies this assay can detect is 138 copies/mL. A negative result does not preclude SARS-Cov-2 infection and should not be used as the sole basis for treatment or other patient management decisions. A negative result may occur with  improper specimen collection/handling, submission of specimen other than nasopharyngeal swab, presence of viral mutation(s) within the areas targeted by this assay, and inadequate number of viral copies(<138 copies/mL). A  negative result must be combined with clinical observations, patient history, and epidemiological information. The expected result is Negative.  Fact Sheet for Patients:  EntrepreneurPulse.com.au  Fact Sheet for Healthcare Providers:  IncredibleEmployment.be  This test is no t yet approved or cleared by the [place] FDA and  has been authorized for detection and/or diagnosis of SARS-CoV-2 by FDA under an Emergency Use Authorization (EUA). This EUA will remain  in effect (meaning this test can be used) for the duration of the COVID-19 declaration under Section 564(b)(1) of the Act, 21 U.S.C.section 360bbb-3(b)(1), unless the authorization is terminated  or revoked sooner.       Influenza A by PCR NEGATIVE NEGATIVE Final   Influenza B by PCR NEGATIVE NEGATIVE Final    Comment: (NOTE) The Xpert Xpress SARS-CoV-2/FLU/RSV plus assay is intended as an aid in the diagnosis of influenza from Nasopharyngeal swab specimens and should not be used as a sole basis for treatment. Nasal washings and aspirates are unacceptable for Xpert Xpress SARS-CoV-2/FLU/RSV testing.  Fact Sheet for Patients: EntrepreneurPulse.com.au  Fact Sheet for Healthcare Providers: IncredibleEmployment.be  This test is not yet approved or cleared by the [place] FDA and has been authorized for detection and/or diagnosis of SARS-CoV-2 by FDA under an Emergency Use Authorization (EUA). This EUA will remain in  effect (meaning this test can be used) for the duration of the COVID-19 declaration under Section 564(b)(1) of the Act, 21 U.S.C. section 360bbb-3(b)(1), unless the authorization is terminated or revoked.  Performed at [facility], [address] [place], [place] [address]   Labs: CBC: Recent Labs  Lab 08/29/21 1049  WBC 11.2*  HGB 12.0  HCT 37.4  MCV 101.9*  PLT 941   Basic Metabolic  Panel: Recent Labs  Lab 08/29/21 1049  NA 140  K 4.0  CL 105  CO2 25  GLUCOSE 106*  BUN 26*  CREATININE 0.90  CALCIUM 9.4   Liver Function Tests: Recent Labs  Lab 08/29/21 1049  AST 15  ALT 13  ALKPHOS 124  BILITOT 0.6  PROT 6.6  ALBUMIN 4.3  CBG: Recent Labs  Lab 08/30/21 1250 08/30/21 1700 08/30/21 2104 08/31/21 0754  GLUCAP 156* 204* 149* 147*  Discharge time spent: 25 minutes.  Signed: Antonieta Pert, MD [facility] 08/31/2021

## 2021-08-31 NOTE — Plan of Care (Signed)

## 2021-09-03 NOTE — Telephone Encounter (Signed)
See my note. Hospital records reviewed. Appears she is seeing her PCP today. I am happy to help with BP management if needed. Chest CTA and labs seemed to be reassuring. thanks

## 2021-09-03 NOTE — Telephone Encounter (Signed)
Spoke with pt and B/P is still high per pt Pt has appt today at 3:00 pm with PCP Encouraged pt to continue to monitor and if remains consistently high to call back  Will forward to Dr Burt Knack for review .Adonis Housekeeper

## 2021-09-03 NOTE — Telephone Encounter (Signed)
Reviewed hospital records, including labs and radiographic studies.  Reviewed discharge summary.  Appears patient has seen her primary care doctor today.  There is no evidence that an acute cardiac event occurred during this hospitalization.  If the patient requires assistance with management of her hypertension, I am happy to help however I can.  Please let me know.  Sherren Mocha 09/03/2021 4:51 PM

## 2021-09-04 NOTE — Telephone Encounter (Signed)
Pt aware if needs assistance with B/P control to call back Pt verbalized understanding ./cy

## 2021-09-21 ENCOUNTER — Ambulatory Visit (HOSPITAL_BASED_OUTPATIENT_CLINIC_OR_DEPARTMENT_OTHER): Payer: No Typology Code available for payment source | Admitting: Family

## 2021-09-21 NOTE — Progress Notes (Unsigned)
Office Visit    Patient Name: Elizabeth Mcmillan Date of Encounter: 09/21/2021  PCP:  Cleophas Dunker   [facility]  Cardiologist:  Sherren Mocha, MD  Advanced Practice Provider:  No care team member to display Electrophysiologist:  None    Chief Complaint    Elizabeth Mcmillan is a 81 y.o. female with a hx of CAD s/p NSTEMI with DES x2 to LAD 01/17/21, DM2, HTN, HLD presents today for hospital follow up.   Past Medical History    Past Medical History:  Diagnosis Date   Anxiety    CAD (coronary artery disease) 08/30/2021   Diabetes mellitus without complication (HCC)    GERD (gastroesophageal reflux disease)    Hypercholesteremia    Hypertension    NSTEMI (non-ST elevated myocardial infarction) ([place]) 01/17/2021   Past Surgical History:  Procedure Laterality Date   ABDOMINAL HYSTERECTOMY     BREAST EXCISIONAL BIOPSY Left years ago   negative   COLON SURGERY     COLONOSCOPY WITH PROPOFOL N/A 09/04/2016   Procedure: COLONOSCOPY WITH PROPOFOL;  Surgeon: Manya Silvas, MD;  Location: [place];  Service: Endoscopy;  Laterality: N/A;   CORONARY STENT INTERVENTION N/A 01/18/2021   Procedure: CORONARY STENT INTERVENTION;  Surgeon: Lorretta Harp, MD;  Location: [place] [facility];  Service: Cardiovascular;  Laterality: N/A;   INTRAVASCULAR ULTRASOUND/IVUS N/A 01/18/2021   Procedure: Intravascular Ultrasound/IVUS;  Surgeon: Lorretta Harp, MD;  Location: [place] [facility];  Service: Cardiovascular;  Laterality: N/A;   LEFT HEART CATH AND CORONARY ANGIOGRAPHY N/A 01/18/2021   Procedure: LEFT HEART CATH AND CORONARY ANGIOGRAPHY;  Surgeon: Lorretta Harp, MD;  Location: [place] [facility];  Service: Cardiovascular;  Laterality: N/A;   LUMBAR LAMINECTOMY/ DECOMPRESSION WITH MET-RX Right 05/09/2015   Procedure: Far Lateral Lumbar Three-Four Microdiscectomy WITH MET-RX;  Surgeon: Erline Levine, MD;  Location: [facility];  Service:  Neurosurgery;  Laterality: Right;    Allergies  No Known Allergies  History of Present Illness    Elizabeth Mcmillan is a 81 y.o. female with a hx of CAD s/p NSTEMI with DES x2 to LAD 01/17/21, DM2, HTN, HLD, mitral regurgitation, aortic regurgitation, dilation of ascending aorta last seen 03/2021. Her family history is notable for coronary artery disease.  She presented to the ED 01/17/2021 with aching in both arms, pain in her left shoulder.  It was associated with diaphoresis and shortness of breath but no nausea or vomiting. HS troponin peaked at 1596.  She underwent cardiac catheterization 01/18/2021 with ostial LAD 50% and mid LAD 95% lesion treated with PCI/DES x2.  She was recommended for aspirin and Brilinta for at least 1 year.  She had echocardiogram 01/19/2021 showing LVEF 50 to 57%, grade 1 diastolic dysfunction, severe akinesis of the mid apical anteroseptal wall and apical segment, RV normal size and function, mild MR, moderate dilation of ascending aorta 40 mm, mild to moderate AI.  Seen 02/02/21 noting mild tachycardia and palpitations, Amlodipine stopped and Carvedilol increased.  Brilinta switched to Plavix due to cost.  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 Brilinta patient assistance approved. Lipid panel with LDL not at goal so Zetia added.  Admitted 08/29/21-08/31/21 after presenting with right-sided chest pain and mechanical fall the week prior while walking downstairs with her daughter.  CTA chest mild to moderate cardiomegaly.  There was ectatic and borderline ascending aorta up to 39 mm  on chest CT but not described on CTA chest.  She was admitted for hypertensive urgency.  Carotid duplex with no significant stenosis.  She was discharged on amlodipine as well as previous Coreg dose.  She presents today for follow-up. ***  EKGs/Labs/Other Studies Reviewed:   The following studies were reviewed today:  Echo 01/19/2021  1. Left ventricular ejection  fraction, by estimation, is 50 to 55%. The  left ventricle has low normal function. The left ventricle demonstrates  regional wall motion abnormalities (see scoring diagram/findings for  description). Left ventricular diastolic   parameters are consistent with Grade I diastolic dysfunction (impaired  relaxation). There is severe akinesis of the left ventricular, mid-apical  anteroseptal wall and apical segment.   2. Right ventricular systolic function is normal. The right ventricular  size is normal.   3. Left atrial size was mildly dilated.   4. The mitral valve is normal in structure. Mild mitral valve  regurgitation.   5. The aortic valve is normal in structure. Aortic valve regurgitation is  mild to moderate.   6. Aortic dilatation noted. There is moderate dilatation of the ascending  aorta, measuring 40 mm.   Cath: 01/18/21   Ost LAD to Prox LAD lesion is 50% stenosed. Mid LAD lesion is 95% stenosed. A drug-eluting stent was successfully placed using a STENT ONYX FRONTIER 2.5X22. Post intervention, there is a 0% residual stenosis. A drug-eluting stent was successfully placed using a STENT ONYX FRONTIER 3.5X18. Post intervention, there is a 0% residual stenosis.   IMPRESSION: Ms. Sturgell had successful IVUS guided mid and proximal LAD PCI and drug-eluting stenting.  She will need to be on DAPT for 12 months uninterrupted (low-dose aspirin and Brilinta).  I used 95 cc of contrast with a max GFR of 99 cc.  She will need to be hydrated with close attention to her renal function.  A 2D echo has been ordered.  Dr. Burt Knack was notified of these results.     Quay Burow. MD, [facility] 01/18/2021 3:07 PM   Diagnostic Dominance: Right      Intervention      _____________  EKG:  No EKG is  ordered today.  Recent Labs: 08/29/2021: ALT 13; B Natriuretic Peptide 104.5; BUN 26; Creatinine, Ser 0.90; Hemoglobin 12.0; Platelets 225; Potassium 4.0; Sodium 140  Recent Lipid Panel     Component Value Date/Time   CHOL 165 04/17/2021 0823   CHOL 154 02/12/2014 0559   TRIG 253 (H) 04/17/2021 0823   TRIG 296 (H) 02/12/2014 0559   HDL 41 04/17/2021 0823   HDL 29 (L) 02/12/2014 0559   CHOLHDL 4.0 04/17/2021 0823   CHOLHDL 7.3 01/19/2021 0308   VLDL UNABLE TO CALCULATE IF TRIGLYCERIDE OVER 400 mg/dL 01/19/2021 0308   VLDL 59 (H) 02/12/2014 0559   LDLCALC 82 04/17/2021 0823   LDLCALC 66 02/12/2014 0559   LDLDIRECT 114.2 (H) 01/19/2021 0308   Home Medications   No outpatient medications have been marked as taking for the 09/21/21 encounter (Appointment) with Loel Dubonnet, NP.     Review of Systems      All other systems reviewed and are otherwise negative except as noted above.  Physical Exam    VS:  There were no vitals taken for this visit. , BMI There is no height or weight on file to calculate BMI.  Wt Readings from Last 3 Encounters:  08/29/21 130 lb (59 kg)  04/02/21 127 lb (57.6 kg)  02/02/21 125 lb (56.7 kg)    ***  GEN: Well nourished, well developed, in no acute distress. HEENT: normal. Neck: Supple, no JVD, carotid bruits, or masses. Cardiac: RRR, no murmurs, rubs, or gallops. No clubbing, cyanosis, edema.  Radials/PT 2+ and equal bilaterally.  Respiratory:  Respirations regular and unlabored, clear to auscultation bilaterally. GI: Soft, nontender, nondistended. MS: No deformity or atrophy. Skin: Warm and dry, no rash. Neuro:  Strength and sensation are intact. Psych: Normal affect.  Assessment & Plan    CAD s/p DESx2 to LAD 12/2020- Recommended for DAPT for at least 12 months. ***  GDMT includes DAPT, Plavix, Atorvastatin.   HLD, LDL goal less than 70 -01/19/2021 total cholesterol 205, HDL 28, triglycerides 401, LDL 114.2.  Atorvastatin 80 mg daily started.  03/2021 total cholesterol 165, triglycerides 253, HDL 41, LDL 82.  Zetia 10 mg daily added.  Due for repeat lipid panel.***    LE edema -03/2021 LVEF 94-50%, grade 1 diastolic dysfunction.  ***  HTN -recent admission with hypertensive urgency. ***  DM2 -01/18/2021 A1c 6.5.  08/31/2021 A1c 6.9.  Continue to follow with PCP. ***  Valvular heart disease -Echo 01/19/2021 normal LVEF, mild MR, mild to moderate AI. Consider repeat echo in 1 year. Continue optimal BP control. ***  Moderate dilation of ascending aorta - 01/19/21 40 mm by echo.  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 Continue optimal BP control.  Consider repeat imaging in 1 year.  Can be coordinated at follow-up.  Disposition: Follow up in *** Dr. Burt Knack or APP.  Signed, Loel Dubonnet, NP 09/21/2021, 9:40 AM [place]

## 2021-10-19 ENCOUNTER — Telehealth: Payer: Self-pay | Admitting: Cardiovascular Disease

## 2021-10-19 MED ORDER — CARVEDILOL 12.5 MG PO TABS: 12.5000 mg | ORAL_TABLET | Freq: Two times a day (BID) | ORAL | 5 refills | Status: DC

## 2021-10-19 NOTE — Telephone Encounter (Signed)
?*  STAT* If patient is at the pharmacy, call can be transferred to refill team. ? ? ?1. Which medications need to be refilled? (please list name of each medication and dose if known) carvedilol (COREG) 12.5 MG tablet ? ?2. Which pharmacy/location (including street and city if local pharmacy) is medication to be sent to? ?[id number]- [place], [place] - [address]  ?Phone:  [phone]?Fax:  [phone]? ? ?3. Do they need a 30 day or 90 day supply? 30 ds ? ?

## 2022-04-15 ENCOUNTER — Other Ambulatory Visit: Payer: Self-pay | Admitting: Cardiovascular Disease

## 2022-04-30 ENCOUNTER — Other Ambulatory Visit: Payer: Self-pay | Admitting: Family Medicine

## 2022-04-30 DIAGNOSIS — I1A0 Resistant hypertension: Secondary | ICD-10-CM

## 2022-05-17 ENCOUNTER — Other Ambulatory Visit: Payer: Self-pay | Admitting: Cardiovascular Disease

## 2022-05-17 ENCOUNTER — Other Ambulatory Visit: Payer: Self-pay

## 2022-05-20 ENCOUNTER — Ambulatory Visit
Admission: RE | Admit: 2022-05-20 | Discharge: 2022-05-20 | Disposition: A | Discharge status: Home / Self Care | Payer: Medicare Other | Source: Ambulatory Visit | Attending: Family Medicine | Admitting: Family Medicine

## 2022-05-20 DIAGNOSIS — I1A0 Resistant hypertension: Secondary | ICD-10-CM

## 2022-05-23 ENCOUNTER — Ambulatory Visit (HOSPITAL_BASED_OUTPATIENT_CLINIC_OR_DEPARTMENT_OTHER): Payer: No Typology Code available for payment source | Admitting: Family

## 2022-05-24 ENCOUNTER — Encounter (HOSPITAL_BASED_OUTPATIENT_CLINIC_OR_DEPARTMENT_OTHER): Payer: Self-pay | Admitting: Family

## 2022-05-24 ENCOUNTER — Ambulatory Visit (INDEPENDENT_AMBULATORY_CARE_PROVIDER_SITE_OTHER): Payer: Medicare Other | Admitting: Family

## 2022-05-24 VITALS — BP 204/70 | HR 84 | Ht 60.0 in | Wt 126.0 lb

## 2022-05-24 DIAGNOSIS — I10 Essential (primary) hypertension: Secondary | ICD-10-CM

## 2022-05-24 DIAGNOSIS — I25118 Atherosclerotic heart disease of native coronary artery with other forms of angina pectoris: Secondary | ICD-10-CM | POA: Diagnosis not present

## 2022-05-24 DIAGNOSIS — I340 Nonrheumatic mitral (valve) insufficiency: Secondary | ICD-10-CM

## 2022-05-24 DIAGNOSIS — R0609 Other forms of dyspnea: Secondary | ICD-10-CM

## 2022-05-24 DIAGNOSIS — E785 Hyperlipidemia, unspecified: Secondary | ICD-10-CM

## 2022-05-24 MED ORDER — CARVEDILOL 25 MG PO TABS: 25.0000 mg | ORAL_TABLET | Freq: Two times a day (BID) | ORAL | 3 refills | Status: DC

## 2022-05-24 MED ORDER — AMLODIPINE BESYLATE 10 MG PO TABS: 10.0000 mg | ORAL_TABLET | Freq: Every day | ORAL | 3 refills | Status: DC

## 2022-05-24 NOTE — Progress Notes (Unsigned)
Office Visit    Patient Name: Elizabeth Mcmillan Date of Encounter: 05/24/2022  PCP:  Donnamarie Rossetti, [place]  Cardiologist:  Sherren Mocha, MD  Advanced Practice Provider:  No care team member to display Electrophysiologist:  None    Chief Complaint    Elizabeth Mcmillan is a 81 y.o. female with a hx of CAD s/p NSTEMI with DES x2 to LAD 01/17/21, DM2, HTN, HLD presents today for follow up of CAD  Past Medical History    Past Medical History:  Diagnosis Date   Anxiety    CAD (coronary artery disease) 08/30/2021   Diabetes mellitus without complication ([place])    GERD (gastroesophageal reflux disease)    Hypercholesteremia    Hypertension    NSTEMI (non-ST elevated myocardial infarction) ([place] ) 01/17/2021   Past Surgical History:  Procedure Laterality Date   ABDOMINAL HYSTERECTOMY     BREAST EXCISIONAL BIOPSY Left years ago   negative   COLON SURGERY     COLONOSCOPY WITH PROPOFOL N/A 09/04/2016   Procedure: COLONOSCOPY WITH PROPOFOL;  Surgeon: Manya Silvas, MD;  Location: [facility];  Service: Endoscopy;  Laterality: N/A;   CORONARY STENT INTERVENTION N/A 01/18/2021   Procedure: CORONARY STENT INTERVENTION;  Surgeon: Lorretta Harp, MD;  Location: [place] [facility];  Service: Cardiovascular;  Laterality: N/A;   INTRAVASCULAR ULTRASOUND/IVUS N/A 01/18/2021   Procedure: Intravascular Ultrasound/IVUS;  Surgeon: Lorretta Harp, MD;  Location: [place] [facility];  Service: Cardiovascular;  Laterality: N/A;   LEFT HEART CATH AND CORONARY ANGIOGRAPHY N/A 01/18/2021   Procedure: LEFT HEART CATH AND CORONARY ANGIOGRAPHY;  Surgeon: Lorretta Harp, MD;  Location: [place] [facility];  Service: Cardiovascular;  Laterality: N/A;   LUMBAR LAMINECTOMY/ DECOMPRESSION WITH MET-RX Right 05/09/2015   Procedure: Far Lateral Lumbar Three-Four Microdiscectomy WITH MET-RX;  Surgeon: Erline Levine, MD;  Location: [place] [facility];  Service: Neurosurgery;   Laterality: Right;    Allergies  No Known Allergies  History of Present Illness    Elizabeth Mcmillan is a 81 y.o. female with a hx of CAD s/p NSTEMI with DES x2 to LAD 01/17/21, DM2, HTN, HLD, mitral regurgitation, aortic regurgitation, dilation of ascending aorta last seen 04/02/21  Her family history is notable for coronary artery disease.  She presented to the ED 01/17/2021 with aching in both arms, pain in her left shoulder.  It was associated with diaphoresis and shortness of breath but no nausea or vomiting. HS troponin peaked at 1596.  She underwent cardiac catheterization 01/18/2021 with ostial LAD 50% and mid LAD 95% lesion treated with PCI/DES x2.  Recommended for aspirin and Brilinta for at least 1 year.  Echocardiogram 01/19/2021 showing LVEF 50 to 33%, grade 1 diastolic dysfunction, severe akinesis of the mid apical anteroseptal wall and apical segment, RV normal size and function, mild MR, moderate dilation of ascending aorta 40 mm, mild to moderate AI.  Seen in follow up 02/02/21 with her daughter. Given mild tachycardia with palpitations and to prevent hypotension, Amlodipine was stopped and Carvedilol increased. She was transitioned from Brilinta to Plavix due to cost. At follow up 04/02/21 doing well and recommended to follow up in 4-6 months.   Carotid duplex 08/31/21 with bilateral 1-39% stenosis. Renal duplex 05/20/22 with no renal artery stenosis.   She presents today for follow up with her daughter. 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  BP at home 180s/80s. Endorses compliance  with her medications. PCP recently switched Losartan to Olmesartan. Taking medications at 7AM and 9PM. Tells me her breathing is "different" and feels heavier. Notes at rest and with activity as if she gets out of breath. Tells me she has had difficulty with her throat since May and has appointment this month to see ENT. Waking in the morning hoarse. When she eats something sweet or a  carbohydrate feels she has "something coming up in her throat". No cough. Notes indigestion for which she takes Prilosec over the counter.   EKGs/Labs/Other Studies Reviewed:   The following studies were reviewed today:  Echo 01/19/2021  1. Left ventricular ejection fraction, by estimation, is 50 to 55%. The  left ventricle has low normal function. The left ventricle demonstrates  regional wall motion abnormalities (see scoring diagram/findings for  description). Left ventricular diastolic   parameters are consistent with Grade I diastolic dysfunction (impaired  relaxation). There is severe akinesis of the left ventricular, mid-apical  anteroseptal wall and apical segment.   2. Right ventricular systolic function is normal. The right ventricular  size is normal.   3. Left atrial size was mildly dilated.   4. The mitral valve is normal in structure. Mild mitral valve  regurgitation.   5. The aortic valve is normal in structure. Aortic valve regurgitation is  mild to moderate.   6. Aortic dilatation noted. There is moderate dilatation of the ascending  aorta, measuring 40 mm.   Cath: 01/18/21   Ost LAD to Prox LAD lesion is 50% stenosed. Mid LAD lesion is 95% stenosed. A drug-eluting stent was successfully placed using a STENT ONYX FRONTIER 2.5X22. Post intervention, there is a 0% residual stenosis. A drug-eluting stent was successfully placed using a STENT ONYX FRONTIER 3.5X18. Post intervention, there is a 0% residual stenosis.   IMPRESSION: Ms. Messenger had successful IVUS guided mid and proximal LAD PCI and drug-eluting stenting.  She will need to be on DAPT for 12 months uninterrupted (low-dose aspirin and Brilinta).  I used 95 cc of contrast with a max GFR of 99 cc.  She will need to be hydrated with close attention to her renal function.  A 2D echo has been ordered.  Dr. Burt Knack was notified of these results.     Quay Burow. MD, [facility] 01/18/2021 3:07 PM    Diagnostic Dominance: Right      Intervention      _____________  EKG:  No EKG is  ordered today.  The ekg independently reviewed from 02/02/21 demonstrated  NSR 97 bpm with stable anterolateral TWI. No acute St/T wave changes.   Recent Labs: 08/29/2021: ALT 13; B Natriuretic Peptide 104.5; BUN 26; Creatinine, Ser 0.90; Hemoglobin 12.0; Platelets 225; Potassium 4.0; Sodium 140  Recent Lipid Panel    Component Value Date/Time   CHOL 165 04/17/2021 0823   CHOL 154 02/12/2014 0559   TRIG 253 (H) 04/17/2021 0823   TRIG 296 (H) 02/12/2014 0559   HDL 41 04/17/2021 0823   HDL 29 (L) 02/12/2014 0559   CHOLHDL 4.0 04/17/2021 0823   CHOLHDL 7.3 01/19/2021 0308   VLDL UNABLE TO CALCULATE IF TRIGLYCERIDE OVER 400 mg/dL 01/19/2021 0308   VLDL 59 (H) 02/12/2014 0559   LDLCALC 82 04/17/2021 0823   LDLCALC 66 02/12/2014 0559   LDLDIRECT 114.2 (H) 01/19/2021 0308   Home Medications   Current Meds  Medication Sig   acetaminophen (TYLENOL) 500 MG tablet Take 1,000 mg by mouth every 6 (six) hours as needed (pain).  aspirin EC 81 MG tablet Take 81 mg by mouth at bedtime.   atorvastatin (LIPITOR) 80 MG tablet Take 1 tablet (80 mg total) by mouth daily.   carvedilol (COREG) 12.5 MG tablet Take 1 tablet (12.5 mg total) by mouth 2 (two) times daily with a meal. Please call to schedule an overdue appointment with Dr. Burt Knack, [phone], thank you. 2ND attempt.   diphenhydramine-acetaminophen (TYLENOL PM) 25-500 MG TABS tablet Take 1 tablet by mouth at bedtime.   hydrALAZINE (APRESOLINE) 50 MG tablet Take 50 mg by mouth 2 (two) times daily.   loperamide (IMODIUM) 2 MG capsule Take 1 capsule (2 mg total) by mouth as needed for diarrhea or loose stools.   metFORMIN (GLUCOPHAGE-XR) 750 MG 24 hr tablet Take 750 mg by mouth every evening.   nitroGLYCERIN (NITROSTAT) 0.4 MG SL tablet Place 1 tablet (0.4 mg total) under the tongue every 5 (five) minutes as needed.   olmesartan (BENICAR) 40 MG tablet  Take 40 mg by mouth daily.   ticagrelor (BRILINTA) 90 MG TABS tablet Take 90 mg by mouth 2 (two) times daily.   venlafaxine XR (EFFEXOR-XR) 150 MG 24 hr capsule Take 150 mg by mouth daily.     Review of Systems      All other systems reviewed and are otherwise negative except as noted above.  Physical Exam    VS:  BP (!) 204/70   Pulse 84   Ht 5' (1.524 m)   Wt 126 lb (57.2 kg)   BMI 24.61 kg/m  , BMI Body mass index is 24.61 kg/m.  Wt Readings from Last 3 Encounters:  05/24/22 126 lb (57.2 kg)  08/29/21 130 lb (59 kg)  04/02/21 127 lb (57.6 kg)     GEN: Well nourished, well developed, in no acute distress. HEENT: normal. Neck: Supple, no JVD, carotid bruits, or masses. Cardiac: RRR, no murmurs, rubs, or gallops. No clubbing, cyanosis, edema.  Radials/PT 2+ and equal bilaterally.  Respiratory:  Respirations regular and unlabored, clear to auscultation bilaterally. GI: Soft, nontender, nondistended. MS: No deformity or atrophy. Skin: Warm and dry, no rash. Neuro:  Strength and sensation are intact. Psych: Normal affect.  Assessment & Plan    CAD s/p DESx2 to LAD 12/2020  Aortic atherosclerosis - Recommended for DAPT for at least 12 months. Completed 12 months of DAPT - discontinue Brilinta. GDMT Aspirin, Atorvastatin, Carvedilol. Heart healthy diet and regular cardiovascular exercise encouraged.    HLD, LDL goal less than 70 -03/2021 LDL 82. Continue Atorvastatin 72m daily. Previously recommended to start Zetia, but she is not presently taking for unclear reason. Not addressed at this clinic visit - address at follow up.   Carotid artery stenosis - 08/2021 carotid duplex with bilateral 1-39% stenosis. Consider repeat duplex 08/2022. Can coordinate at follow up. Continue Atorvastatin.    HTN - BP not at goal <130/80. Continue Olmesartan 429mdaily. Increase Amlodipine to 1083mCarvedilol to 103m54mD. Continue Hydralazine 100mg52m. Future consideration include thiazide diuretic  for combo tablet amlodipine-olmesartan-hctz pending cost. Renal duplex no stenosis. 07/2020 normal TSH. Plan for cortisol, catecholamines, metanephrines to rule out secondary causes.   DM2 -01/18/2021 A1c 6.5. Continue to follow with PCP.   Valvular heart disease -Echo 01/19/2021 normal LVEF, mild MR, mild to moderate AI. Due for repeat echocardiogram, ordered today.   Moderate dilation of ascending aorta 40 mm by echo 01/19/2021, 39mm 20mT 08/29/21-Due for repeat echo, ordered today. Continue optimal BP control.   Disposition: Follow up in  1 month Dr. Burt Knack or APP.  Signed, Loel Dubonnet, NP 05/24/2022, 3:10 PM [place]

## 2022-05-24 NOTE — Patient Instructions (Addendum)
Medication Instructions:  Your physician has recommended you make the following change in your medication:    STOP Brilinta  INCREASE Carvedilol to '25mg'$  twice daily  INCREASE Amlodipine to '10mg'$  daily  *If you need a refill on your cardiac medications before your next appointment, please call your pharmacy*   Lab Work: Your physician recommends that you return for lab work one day next week in the morning: cortisol, catecholamines, metanephrines.   If you have labs (blood work) drawn today and your tests are completely normal, you will receive your results only by: [place] (if you have MyChart) OR A paper copy in the mail If you have any lab test that is abnormal or we need to change your treatment, we will call you to review the results.   Testing/Procedures: Your physician has requested that you have an echocardiogram. Echocardiography is a painless test that uses sound waves to create images of your heart. It provides your doctor with information about the size and shape of your heart and how well your heart's chambers and valves are working. This procedure takes approximately one hour. There are no restrictions for this procedure. Please do NOT wear cologne, perfume, aftershave, or lotions (deodorant is allowed). Please arrive 15 minutes prior to your appointment time.    Follow-Up: At [facility], you and your health needs are our priority.  As part of our continuing mission to provide you with exceptional heart care, we have created designated Provider Care Teams.  These Care Teams include your primary Cardiologist (physician) and Advanced Practice Providers (APPs -  Physician Assistants and Nurse Practitioners) who all work together to provide you with the care you need, when you need it.  We recommend signing up for the patient portal called "MyChart".  Sign up information is provided on this After Visit Summary.  MyChart is used to connect with patients for  Virtual Visits (Telemedicine).  Patients are able to view lab/test results, encounter notes, upcoming appointments, etc.  Non-urgent messages can be sent to your provider as well.   To learn more about what you can do with MyChart, go to [email or web address].    Your next appointment:   1 month(s)  The format for your next appointment:   In Person  Provider:   Sherren Mocha, MD  or Loel Dubonnet, NP    Other Instructions  Heart Healthy Diet Recommendations: A low-salt diet is recommended. Meats should be grilled, baked, or boiled. Avoid fried foods. Focus on lean protein sources like fish or chicken with vegetables and fruits. The American Heart Association is a Microbiologist!  American Heart Association Diet and Lifeystyle Recommendations   Exercise recommendations: The American Heart Association recommends 150 minutes of moderate intensity exercise weekly. Try 30 minutes of moderate intensity exercise 4-5 times per week. This could include walking, jogging, or swimming.

## 2022-05-31 ENCOUNTER — Telehealth (HOSPITAL_BASED_OUTPATIENT_CLINIC_OR_DEPARTMENT_OTHER): Payer: Self-pay

## 2022-05-31 NOTE — Telephone Encounter (Addendum)
Left message for patient to call back     ----- Message from Gerald Stabs, RN sent at 05/24/2022  3:44 PM EDT ----- BP for CW

## 2022-06-04 NOTE — Telephone Encounter (Signed)
Left message for patient to call back  

## 2022-06-06 ENCOUNTER — Encounter (HOSPITAL_BASED_OUTPATIENT_CLINIC_OR_DEPARTMENT_OTHER): Payer: Self-pay

## 2022-06-06 NOTE — Telephone Encounter (Signed)
FYI

## 2022-06-06 NOTE — Telephone Encounter (Signed)
3rd call attempt, no answer, left message. Will mail letter to her asking her to call the office.

## 2022-06-09 ENCOUNTER — Other Ambulatory Visit: Payer: Self-pay | Admitting: Cardiovascular Disease

## 2022-06-10 ENCOUNTER — Ambulatory Visit (INDEPENDENT_AMBULATORY_CARE_PROVIDER_SITE_OTHER): Payer: Medicare Other

## 2022-06-10 DIAGNOSIS — R0609 Other forms of dyspnea: Secondary | ICD-10-CM

## 2022-06-10 DIAGNOSIS — I340 Nonrheumatic mitral (valve) insufficiency: Secondary | ICD-10-CM | POA: Diagnosis not present

## 2022-06-10 LAB — ECHOCARDIOGRAM COMPLETE
Area-P 1/2: 3.17 cm2
P 1/2 time: 630 msec
S' Lateral: 1.93 cm

## 2022-06-11 ENCOUNTER — Telehealth (HOSPITAL_BASED_OUTPATIENT_CLINIC_OR_DEPARTMENT_OTHER): Payer: Self-pay

## 2022-06-11 DIAGNOSIS — I10 Essential (primary) hypertension: Secondary | ICD-10-CM

## 2022-06-11 DIAGNOSIS — R0609 Other forms of dyspnea: Secondary | ICD-10-CM

## 2022-06-11 NOTE — Telephone Encounter (Addendum)
Called results to patient and left results on VM (ok per DPR), instructions left to call office back if patient has any questions!      ----- Message from Loel Dubonnet, NP sent at 06/10/2022  5:03 PM EST ----- Echo with normal heart muscle function. Heart muscle mildly stiff - prevent from worsening by keeping blood pressure well controlled. Mild leaking of the aortic valve stable compared to previous. Mild dilation of ascending aorta. Repeat echocardiogram in one year for monitoring.

## 2022-07-01 ENCOUNTER — Ambulatory Visit (INDEPENDENT_AMBULATORY_CARE_PROVIDER_SITE_OTHER): Payer: Medicare Other | Admitting: Family

## 2022-07-01 ENCOUNTER — Telehealth: Payer: Self-pay | Admitting: Licensed Clinical Social Worker

## 2022-07-01 ENCOUNTER — Encounter (HOSPITAL_BASED_OUTPATIENT_CLINIC_OR_DEPARTMENT_OTHER): Payer: Self-pay | Admitting: Family

## 2022-07-01 VITALS — BP 158/68 | HR 78 | Ht 60.0 in | Wt 127.0 lb

## 2022-07-01 DIAGNOSIS — E785 Hyperlipidemia, unspecified: Secondary | ICD-10-CM

## 2022-07-01 DIAGNOSIS — I10 Essential (primary) hypertension: Secondary | ICD-10-CM

## 2022-07-01 DIAGNOSIS — I25118 Atherosclerotic heart disease of native coronary artery with other forms of angina pectoris: Secondary | ICD-10-CM

## 2022-07-01 DIAGNOSIS — I77810 Thoracic aortic ectasia: Secondary | ICD-10-CM

## 2022-07-01 DIAGNOSIS — I340 Nonrheumatic mitral (valve) insufficiency: Secondary | ICD-10-CM

## 2022-07-01 MED ORDER — ATORVASTATIN CALCIUM 80 MG PO TABS: 80.0000 mg | ORAL_TABLET | Freq: Every day | ORAL | 3 refills | Status: DC

## 2022-07-01 MED ORDER — OLMESARTAN-AMLODIPINE-HCTZ 40-10-25 MG PO TABS: 1.0000 | ORAL_TABLET | Freq: Every day | ORAL | 1 refills | Status: DC

## 2022-07-01 MED ORDER — OLMESARTAN MEDOXOMIL 40 MG PO TABS: 40.0000 mg | ORAL_TABLET | Freq: Every day | ORAL | 5 refills | Status: DC

## 2022-07-01 MED ORDER — AMLODIPINE BESYLATE 10 MG PO TABS: 10.0000 mg | ORAL_TABLET | Freq: Every day | ORAL | 5 refills | Status: DC

## 2022-07-01 MED ORDER — HYDROCHLOROTHIAZIDE 25 MG PO TABS: 25.0000 mg | ORAL_TABLET | Freq: Every day | ORAL | 5 refills | Status: DC

## 2022-07-01 MED ORDER — HYDRALAZINE HCL 100 MG PO TABS: 100.0000 mg | ORAL_TABLET | Freq: Two times a day (BID) | ORAL | 5 refills | Status: DC

## 2022-07-01 NOTE — Patient Instructions (Addendum)
Medication Instructions:   Recommend enrolling in Medicare Part D if possible to help with prescription drug costs.   With prescription drug coverage we may be able to start a combination tablet but it is very expensive without insurance.  _______________________  Your physician has recommended you make the following change in your medication:   CONTINUE Carvedilol '25mg'$  twice daily  CONTINUE Hydralazine '100mg'$  twice daily  CONTINUE Amlodipine '10mg'$  daily  CONTINUE Olmesartan '40mg'$  daily  ____________________  START Hydrochlorothiazide '25mg'$  daily  *If you need a refill on your cardiac medications before your next appointment, please call your pharmacy*   Lab Work/Testing/Procedures: Recommend labs in 1-2 weeks for BMP, catecholamines, metanephrines, cortisol. Please have collected in the morning and be fasting.   Please return for Lab work. You may come to the...   [facility] (3rd floor) [address], [place], [place] [address]  Open: 8am-Noon and 1pm-4:30pm  Please ring the doorbell on the small table when you exit the elevator and the Lab Tech will come get you  Elizabeth Mcmillan at [facility] [address] [place], [place], [place] [address] Open: 8am-1pm, then 2pm-4:30pm   [place]- Please see attached locations sheet stapled to your lab work with address and hours.    Follow-Up: At [facility], you and your health needs are our priority.  As part of our continuing mission to provide you with exceptional heart care, we have created designated Provider Care Teams.  These Care Teams include your primary Cardiologist (physician) and Advanced Practice Providers (APPs -  Physician Assistants and Nurse Practitioners) who all work together to provide you with the care you need, when you need it.  We recommend signing up for the patient portal called "MyChart".  Sign up information is provided on this After Visit Summary.  MyChart is  used to connect with patients for Virtual Visits (Telemedicine).  Patients are able to view lab/test results, encounter notes, upcoming appointments, etc.  Non-urgent messages can be sent to your provider as well.   To learn more about what you can do with MyChart, go to [email or web address].    Your next appointment:   6 weeks in person with Dr. Burt Knack or Advanced Practice Provider or Pharmacist for blood pressure follow up  Other Instructions  Tips to Measure your Blood Pressure Correctly  Check blood pressure once per day at least 1 hour after medications.  Here's what you can do to ensure a correct reading:  Don't drink a caffeinated beverage or smoke during the 30 minutes before the test.  Sit quietly for five minutes before the test begins.  During the measurement, sit in a chair with your feet on the floor and your arm supported so your elbow is at about heart level.  The inflatable part of the cuff should completely cover at least 80% of your upper arm, and the cuff should be placed on bare skin, not over a shirt.  Don't talk during the measurement.  Blood pressure categories  Blood pressure category SYSTOLIC (upper number)  DIASTOLIC (lower number)  Normal Less than 120 mm Hg and Less than 80 mm Hg  Elevated 120-129 mm Hg and Less than 80 mm Hg  High blood pressure: Stage 1 hypertension 130-139 mm Hg or 80-89 mm Hg  High blood pressure: Stage 2 hypertension 140 mm Hg or higher or 90 mm Hg or higher  Hypertensive crisis (consult your doctor immediately) Higher than 180 mm Hg and/or Higher than 120 mm  Hg  Source: American Heart Association and American Stroke Association. For more on getting your blood pressure under control, buy Controlling Your Blood Pressure, a Special Health Report from [facility].   Blood Pressure Log   Date   Time  Blood Pressure  Example: Nov 1 9 AM 124/78

## 2022-07-01 NOTE — Telephone Encounter (Signed)
H&V Care Navigation CSW Progress Note  Clinical Social Worker  contacted by provider  to f/u with pt regarding pharmacy assistance. Per provider pt not enrolled in pharmacy benefits. LCSW emailed Urban Gibson, NP with the following information:  [facility] (Ask for [place]- appt needed)    CONTACT us Phone: [phone] Fax: [phone]                           Physical Address: [address] [place], [place] [address] Hours:  M-F  8:00am - 5:00pm [facility]) counsels Medicare beneficiaries and caregivers about Medicare, Medicare supplements, Medicare Advantage, Medicare Part D, and long-term care insurance. The counselors on our toll-free line offer free and unbiased information regarding Medicare health care products. We also help people recognize and prevent Medicare billing errors and possible fraud and abuse through the [facility] [employer] Program. [facility] counselors are not licensed insurance agents, and they do not sell or endorse any product, plan, or company.  If you have questions about your specific plan, we encourage you to contact your insurance agent or the insurance company.   I will f/u with pt to encourage them to enroll in coverage if possible, if not there may be some options available for pt assistance.  Patient is participating in a Managed Medicaid Plan:  No, traditional Medicare and [employer]   Tobacco Use: Low Risk  (07/01/2022)    Westley Hummer, MSW, [place]  [phone]- work cell phone (preferred) [phone]- desk phone

## 2022-07-01 NOTE — Progress Notes (Signed)
Office Visit    Patient Name: Elizabeth Mcmillan Date of Encounter: 07/01/2022  PCP:  Cleophas Dunker   [facility]  Cardiologist:  Sherren Mocha, MD  Advanced Practice Provider:  No care team member to display Electrophysiologist:  None    Chief Complaint    Elizabeth Mcmillan is a 81 y.o. female with a hx of CAD s/p NSTEMI with DES x2 to LAD 01/17/21, DM2, HTN, HLD presents today for follow up of hypertension.   Past Medical History    Past Medical History:  Diagnosis Date   Anxiety    CAD (coronary artery disease) 08/30/2021   Diabetes mellitus without complication (HCC)    GERD (gastroesophageal reflux disease)    Hypercholesteremia    Hypertension    NSTEMI (non-ST elevated myocardial infarction) ([place]) 01/17/2021   Past Surgical History:  Procedure Laterality Date   ABDOMINAL HYSTERECTOMY     BREAST EXCISIONAL BIOPSY Left years ago   negative   COLON SURGERY     COLONOSCOPY WITH PROPOFOL N/A 09/04/2016   Procedure: COLONOSCOPY WITH PROPOFOL;  Surgeon: Manya Silvas, MD;  Location: [place];  Service: Endoscopy;  Laterality: N/A;   CORONARY STENT INTERVENTION N/A 01/18/2021   Procedure: CORONARY STENT INTERVENTION;  Surgeon: Lorretta Harp, MD;  Location: [place] CV LAB;  Service: Cardiovascular;  Laterality: N/A;   INTRAVASCULAR ULTRASOUND/IVUS N/A 01/18/2021   Procedure: Intravascular Ultrasound/IVUS;  Surgeon: Lorretta Harp, MD;  Location: [place] CV LAB;  Service: Cardiovascular;  Laterality: N/A;   LEFT HEART CATH AND CORONARY ANGIOGRAPHY N/A 01/18/2021   Procedure: LEFT HEART CATH AND CORONARY ANGIOGRAPHY;  Surgeon: Lorretta Harp, MD;  Location: [place] CV LAB;  Service: Cardiovascular;  Laterality: N/A;   LUMBAR LAMINECTOMY/ DECOMPRESSION WITH MET-RX Right 05/09/2015   Procedure: Far Lateral Lumbar Three-Four Microdiscectomy WITH MET-RX;  Surgeon: Erline Levine, MD;  Location: [place] [facility];  Service:  Neurosurgery;  Laterality: Right;    Allergies  No Known Allergies  History of Present Illness    Elizabeth Mcmillan is a 81 y.o. female with a hx of CAD s/p NSTEMI with DES x2 to LAD 01/17/21, DM2, HTN, HLD, mitral regurgitation, aortic regurgitation, dilation of ascending aorta last seen 05/24/22.  Her family history is notable for coronary artery disease.  She presented to the ED 01/17/2021 with aching in both arms, pain in her left shoulder.  It was associated with diaphoresis and shortness of breath but no nausea or vomiting. HS troponin peaked at 1596.  She underwent cardiac catheterization 01/18/2021 with ostial LAD 50% and mid LAD 95% lesion treated with PCI/DES x2.  Recommended for aspirin and Brilinta for at least 1 year.  Echocardiogram 01/19/2021 showing LVEF 50 to 80%, grade 1 diastolic dysfunction, severe akinesis of the mid apical anteroseptal wall and apical segment, RV normal size and function, mild MR, moderate dilation of ascending aorta 40 mm, mild to moderate AI.  Seen in follow up 02/02/21 with her daughter. Given mild tachycardia with palpitations and to prevent hypotension, Amlodipine was stopped and Carvedilol increased. She was transitioned from Brilinta to Plavix due to cost. At follow up 04/02/21 doing well and recommended to follow up in 4-6 months.   Carotid duplex 08/31/21 with bilateral 1-39% stenosis. Renal duplex 05/20/22 with no renal artery stenosis.   Last seen 05/24/22 with BP not at goal. Amlodipine increased to 10m and Coreg increased to 253mBID. Hydralazine continued at 10037mID. Updated echo  ordered and performed  06/10/22 normal LVEF, mild LVH, mild AI, mild dilation ascending aorta  27m. Metanephrines, cortisol, catecholamines ordered but not collected.  Presents today for follow up independently. She brings her pill bottles and BP home measurements. She has some confusion regarding medications as has multiple doses of Hydralazine and Carvedilol but not certain  which she is taking as her daughter fills her pill box. BP at home labile 120s-170s with average 153. Checking both before and after medications. Takes meds at 7:30AM and 8:30PM. Notes she saw ENT related to hoarseness but was told she could not take the recommended medication due to her cardiac conditions - does not recall name and notes unavailable for review at time of clinic visit. Exertional dyspnea is stable at baseline. No chest pain, edema, orthopnea, PND.   EKGs/Labs/Other Studies Reviewed:   The following studies were reviewed today:  Echo 01/19/2021  1. Left ventricular ejection fraction, by estimation, is 50 to 55%. The  left ventricle has low normal function. The left ventricle demonstrates  regional wall motion abnormalities (see scoring diagram/findings for  description). Left ventricular diastolic   parameters are consistent with Grade I diastolic dysfunction (impaired  relaxation). There is severe akinesis of the left ventricular, mid-apical  anteroseptal wall and apical segment.   2. Right ventricular systolic function is normal. The right ventricular  size is normal.   3. Left atrial size was mildly dilated.   4. The mitral valve is normal in structure. Mild mitral valve  regurgitation.   5. The aortic valve is normal in structure. Aortic valve regurgitation is  mild to moderate.   6. Aortic dilatation noted. There is moderate dilatation of the ascending  aorta, measuring 40 mm.   Cath: 01/18/21   Ost LAD to Prox LAD lesion is 50% stenosed. Mid LAD lesion is 95% stenosed. A drug-eluting stent was successfully placed using a STENT ONYX FRONTIER 2.5X22. Post intervention, there is a 0% residual stenosis. A drug-eluting stent was successfully placed using a STENT ONYX FRONTIER 3.5X18. Post intervention, there is a 0% residual stenosis.   IMPRESSION: Ms. PHendleyhad successful IVUS guided mid and proximal LAD PCI and drug-eluting stenting.  She will need to be on DAPT for  12 months uninterrupted (low-dose aspirin and Brilinta).  I used 95 cc of contrast with a max GFR of 99 cc.  She will need to be hydrated with close attention to her renal function.  A 2D echo has been ordered.  Dr. CBurt Knackwas notified of these results.     Elizabeth Burow MD, [facility]6/30/2022 3:07 PM   Diagnostic Dominance: Right      Intervention      _____________  EKG:  No EKG is  ordered today.   Recent Labs: 08/29/2021: ALT 13; B Natriuretic Peptide 104.5; BUN 26; Creatinine, Ser 0.90; Hemoglobin 12.0; Platelets 225; Potassium 4.0; Sodium 140  Recent Lipid Panel    Component Value Date/Time   CHOL 165 04/17/2021 0823   CHOL 154 02/12/2014 0559   TRIG 253 (H) 04/17/2021 0823   TRIG 296 (H) 02/12/2014 0559   HDL 41 04/17/2021 0823   HDL 29 (L) 02/12/2014 0559   CHOLHDL 4.0 04/17/2021 0823   CHOLHDL 7.3 01/19/2021 0308   VLDL UNABLE TO CALCULATE IF TRIGLYCERIDE OVER 400 mg/dL 01/19/2021 0308   VLDL 59 (H) 02/12/2014 0559   LDLCALC 82 04/17/2021 0823   LDLCALC 66 02/12/2014 0559   LDLDIRECT 114.2 (H) 01/19/2021 0308   Home Medications  Current Meds  Medication Sig   acetaminophen (TYLENOL) 500 MG tablet Take 1,000 mg by mouth every 6 (six) hours as needed (pain).   amLODipine (NORVASC) 10 MG tablet Take 1 tablet (10 mg total) by mouth daily.   aspirin EC 81 MG tablet Take 81 mg by mouth at bedtime.   hydrochlorothiazide (HYDRODIURIL) 25 MG tablet Take 1 tablet (25 mg total) by mouth daily.   nitroGLYCERIN (NITROSTAT) 0.4 MG SL tablet Place 1 tablet (0.4 mg total) under the tongue every 5 (five) minutes as needed.   olmesartan (BENICAR) 40 MG tablet Take 1 tablet (40 mg total) by mouth daily.   [DISCONTINUED] amLODipine (NORVASC) 10 MG tablet Take 1 tablet (10 mg total) by mouth daily.   [DISCONTINUED] olmesartan (BENICAR) 40 MG tablet Take 40 mg by mouth daily.   [DISCONTINUED] Olmesartan-amLODIPine-HCTZ 40-10-25 MG TABS Take 1 tablet by mouth daily.     Review of  Systems      All other systems reviewed and are otherwise negative except as noted above.  Physical Exam    VS:  BP (!) 158/68   Pulse 78   Ht 5' (1.524 m)   Wt 127 lb (57.6 kg)   BMI 24.80 kg/m  , BMI Body mass index is 24.8 kg/m.  Wt Readings from Last 3 Encounters:  07/01/22 127 lb (57.6 kg)  05/24/22 126 lb (57.2 kg)  08/29/21 130 lb (59 kg)     GEN: Well nourished, well developed, in no acute distress. HEENT: normal. Neck: Supple, no JVD, carotid bruits, or masses. Cardiac: RRR, no murmurs, rubs, or gallops. No clubbing, cyanosis, edema.  Radials/PT 2+ and equal bilaterally.  Respiratory:  Respirations regular and unlabored, clear to auscultation bilaterally. GI: Soft, nontender, nondistended. MS: No deformity or atrophy. Skin: Warm and dry, no rash. Neuro:  Strength and sensation are intact. Psych: Normal affect.  Assessment & Plan    CAD s/p DESx2 to LAD 12/2020  Aortic atherosclerosis - Previously completed Brilinta. Stable with no anginal symptoms. No indication for ischemic evaluation.   GDMT Aspirin, Atorvastatin, Carvedilol. Heart healthy diet and regular cardiovascular exercise encouraged.    HLD, LDL goal less than 70 -She has been out of Atorvastatin x 1 month - will refill. Consider lipid panel in 2-3 months.    Carotid artery stenosis - 08/2021 carotid duplex with bilateral 1-39% stenosis. Consider repeat duplex 08/2022. Can coordinate at follow up. Continue Atorvastatin as above.   HTN - BP not at goal <130/80. While combination pill would help with medication management, she does not have prescription drug coverage. Given information to enroll in Part D today and social work will follow up. Continue Amlodipine 89m QD, Coreg 252mBID, Hydralazine 10073mID, Olmesartan 5m17m. Start HCTZ 25mg34m BMP in 1-2 weeks. Avoid TID dosing of Hydralazine as suspect compliance would be an issue Secondary workup:Priorr rnal duplex no stenosis. 07/2020 normal TSH. Plan for  cortisol, catecholamines, metanephrines to rule out secondary causes.   DM2 -01/18/2021 A1c 6.5. Continue to follow with PCP.   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 Repeat echo in 1 year already ordered. Continue Carvedilol   Disposition: Follow up in  6 weeks Dr. CooperBurt KnackP.  Signed, CaitliLoel Dubonnet2/05/2022, 8:35 PM [facility]

## 2022-08-10 ENCOUNTER — Other Ambulatory Visit: Payer: Self-pay | Admitting: Cardiovascular Disease

## 2022-08-12 ENCOUNTER — Ambulatory Visit (INDEPENDENT_AMBULATORY_CARE_PROVIDER_SITE_OTHER): Payer: Medicare Other | Admitting: Family

## 2022-08-12 ENCOUNTER — Encounter (HOSPITAL_BASED_OUTPATIENT_CLINIC_OR_DEPARTMENT_OTHER): Payer: Self-pay | Admitting: Family

## 2022-08-12 VITALS — BP 160/72 | HR 82 | Ht 60.0 in | Wt 131.0 lb

## 2022-08-12 DIAGNOSIS — I77810 Thoracic aortic ectasia: Secondary | ICD-10-CM

## 2022-08-12 DIAGNOSIS — E785 Hyperlipidemia, unspecified: Secondary | ICD-10-CM

## 2022-08-12 DIAGNOSIS — I25118 Atherosclerotic heart disease of native coronary artery with other forms of angina pectoris: Secondary | ICD-10-CM

## 2022-08-12 DIAGNOSIS — I10 Essential (primary) hypertension: Secondary | ICD-10-CM

## 2022-08-12 DIAGNOSIS — I6523 Occlusion and stenosis of bilateral carotid arteries: Secondary | ICD-10-CM

## 2022-08-12 DIAGNOSIS — I340 Nonrheumatic mitral (valve) insufficiency: Secondary | ICD-10-CM

## 2022-08-12 MED ORDER — ISOSORBIDE MONONITRATE ER 30 MG PO TB24: 30.0000 mg | ORAL_TABLET | Freq: Every day | ORAL | 3 refills | Status: DC

## 2022-08-12 NOTE — Patient Instructions (Addendum)
Medication Instructions:  Your physician has recommended you make the following change in your medication:  Start: Imdur '30mg'$  every evening  Imdur (Isosorbide) can cause a headache but it usually improves after a few days. You may take Tylenol as needed. If it does not get better, please let us know.   *If you need a refill on your cardiac medications before your next appointment, please call your pharmacy*   Lab Work: Labs tomorrow!  If you have labs (blood work) drawn today and your tests are completely normal, you will receive your results only by: [facility] (if you have MyChart) OR A paper copy in the mail If you have any lab test that is abnormal or we need to change your treatment, we will call you to review the results.  Follow-Up: At [facility], you and your health needs are our priority.  As part of our continuing mission to provide you with exceptional heart care, we have created designated Provider Care Teams.  These Care Teams include your primary Cardiologist (physician) and Advanced Practice Providers (APPs -  Physician Assistants and Nurse Practitioners) who all work together to provide you with the care you need, when you need it.  We recommend signing up for the patient portal called "MyChart".  Sign up information is provided on this After Visit Summary.  MyChart is used to connect with patients for Virtual Visits (Telemedicine).  Patients are able to view lab/test results, encounter notes, upcoming appointments, etc.  Non-urgent messages can be sent to your provider as well.   To learn more about what you can do with MyChart, go to [email or web address].    Your next appointment:   6-8 Weeks  Provider:   Sherren Mocha, MD  or Hoy Morn    Other Instructions Heart Healthy Diet Recommendations: A low-salt diet is recommended. Meats should be grilled, baked, or boiled. Avoid fried foods. Focus on lean protein sources like fish or chicken with  vegetables and fruits. The American Heart Association is a Microbiologist!  American Heart Association Diet and Lifeystyle Recommendations   Exercise recommendations: The American Heart Association recommends 150 minutes of moderate intensity exercise weekly. Try 30 minutes of moderate intensity exercise 4-5 times per week. This could include walking, jogging, or swimming.

## 2022-08-12 NOTE — Progress Notes (Signed)
Office Visit    Patient Name: [name] Date of Encounter: 08/12/2022  PCP:  Cleophas Dunker   [facility]  Cardiologist:  Sherren Mocha, MD  Advanced Practice Provider:  No care team member to display Electrophysiologist:  None    Chief Complaint    Elizabeth Mcmillan is a 82 y.o. female with a hx of CAD s/p NSTEMI with DES x2 to LAD 01/17/21, DM2, HTN, HLD presents today for follow up of hypertension.   Past Medical History    Past Medical History:  Diagnosis Date   Anxiety    CAD (coronary artery disease) 08/30/2021   Diabetes mellitus without complication (HCC)    GERD (gastroesophageal reflux disease)    Hypercholesteremia    Hypertension    NSTEMI (non-ST elevated myocardial infarction) ([place]) 01/17/2021   Past Surgical History:  Procedure Laterality Date   ABDOMINAL HYSTERECTOMY     BREAST EXCISIONAL BIOPSY Left years ago   negative   COLON SURGERY     COLONOSCOPY WITH PROPOFOL N/A 09/04/2016   Procedure: COLONOSCOPY WITH PROPOFOL;  Surgeon: Manya Silvas, MD;  Location: [place];  Service: Endoscopy;  Laterality: N/A;   CORONARY STENT INTERVENTION N/A 01/18/2021   Procedure: CORONARY STENT INTERVENTION;  Surgeon: Lorretta Harp, MD;  Location: [place] [facility];  Service: Cardiovascular;  Laterality: N/A;   INTRAVASCULAR ULTRASOUND/IVUS N/A 01/18/2021   Procedure: Intravascular Ultrasound/IVUS;  Surgeon: Lorretta Harp, MD;  Location: [place] [facility];  Service: Cardiovascular;  Laterality: N/A;   LEFT HEART CATH AND CORONARY ANGIOGRAPHY N/A 01/18/2021   Procedure: LEFT HEART CATH AND CORONARY ANGIOGRAPHY;  Surgeon: Lorretta Harp, MD;  Location: [place] [facility];  Service: Cardiovascular;  Laterality: N/A;   LUMBAR LAMINECTOMY/ DECOMPRESSION WITH MET-RX Right 05/09/2015   Procedure: Far Lateral Lumbar Three-Four Microdiscectomy WITH MET-RX;  Surgeon: Erline Levine, MD;  Location: [place] [facility];  Service:  Neurosurgery;  Laterality: Right;    Allergies  No Known Allergies  History of Present Illness    Elizabeth Mcmillan is a 82 y.o. female with a hx of CAD s/p NSTEMI with DES x2 to LAD 01/17/21, DM2, HTN, HLD, mitral regurgitation, aortic regurgitation, dilation of ascending aorta last seen 07/01/22.  Her family history is notable for coronary artery disease.  She presented to the ED 01/17/2021 with aching in both arms, pain in her left shoulder.  It was associated with diaphoresis and shortness of breath but no nausea or vomiting. HS troponin peaked at 1596.  She underwent cardiac catheterization 01/18/2021 with ostial LAD 50% and mid LAD 95% lesion treated with PCI/DES x2.  Recommended for aspirin and Brilinta for at least 1 year.  Echocardiogram 01/19/2021 showing LVEF 50 to 58%, grade 1 diastolic dysfunction, severe akinesis of the mid apical anteroseptal wall and apical segment, RV normal size and function, mild MR, moderate dilation of ascending aorta 40 mm, mild to moderate AI.  Seen in follow up 02/02/21 with her daughter. Given mild tachycardia with palpitations and to prevent hypotension, Amlodipine was stopped and Carvedilol increased. She was transitioned from Brilinta to Plavix due to cost. At follow up 04/02/21 doing well and recommended to follow up in 4-6 months.   Carotid duplex 08/31/21 with bilateral 1-39% stenosis. Renal duplex 05/20/22 with no renal artery stenosis.  At visit 05/24/2022 amlodipine increased to 10 mg and carvedilol increased to 25 mg twice daily. Updated echo ordered and performed  06/10/22 normal LVEF, mild  LVH, mild AI, mild dilation ascending aorta  36m. Metanephrines, cortisol, catecholamines ordered but not collected.  She was seen 07/01/2022.  She was started hydrochlorothiazide 25 mg daily.   Presents today for follow up independently. Tells me she fell 2 weeks ago and still had a knot on her leg. Tells me she tripped over a rug on the floor which she has moved. No  lightheadedness, dizziness, near syncope, syncope. Tells me her blood pressure is routinely 150s-160s after medications at home. Her daughter assists her by filling pill box. Does feel it has somewhat improved since addition of HCTZ.  EKGs/Labs/Other Studies Reviewed:   The following studies were reviewed today:  Echo 01/19/2021  1. Left ventricular ejection fraction, by estimation, is 50 to 55%. The  left ventricle has low normal function. The left ventricle demonstrates  regional wall motion abnormalities (see scoring diagram/findings for  description). Left ventricular diastolic   parameters are consistent with Grade I diastolic dysfunction (impaired  relaxation). There is severe akinesis of the left ventricular, mid-apical  anteroseptal wall and apical segment.   2. Right ventricular systolic function is normal. The right ventricular  size is normal.   3. Left atrial size was mildly dilated.   4. The mitral valve is normal in structure. Mild mitral valve  regurgitation.   5. The aortic valve is normal in structure. Aortic valve regurgitation is  mild to moderate.   6. Aortic dilatation noted. There is moderate dilatation of the ascending  aorta, measuring 40 mm.   Cath: 01/18/21   Ost LAD to Prox LAD lesion is 50% stenosed. Mid LAD lesion is 95% stenosed. A drug-eluting stent was successfully placed using a STENT ONYX FRONTIER 2.5X22. Post intervention, there is a 0% residual stenosis. A drug-eluting stent was successfully placed using a STENT ONYX FRONTIER 3.5X18. Post intervention, there is a 0% residual stenosis.   IMPRESSION: Ms. PPohlmanhad successful IVUS guided mid and proximal LAD PCI and drug-eluting stenting.  She will need to be on DAPT for 12 months uninterrupted (low-dose aspirin and Brilinta).  I used 95 cc of contrast with a max GFR of 99 cc.  She will need to be hydrated with close attention to her renal function.  A 2D echo has been ordered.  Dr. CBurt Knackwas notified  of these results.     JQuay Burow MD, [facility]6/30/2022 3:07 PM   Diagnostic Dominance: Right      Intervention      _____________  EKG:  No EKG is  ordered today.   Recent Labs: 08/29/2021: ALT 13; B Natriuretic Peptide 104.5; BUN 26; Creatinine, Ser 0.90; Hemoglobin 12.0; Platelets 225; Potassium 4.0; Sodium 140  Recent Lipid Panel    Component Value Date/Time   CHOL 165 04/17/2021 0823   CHOL 154 02/12/2014 0559   TRIG 253 (H) 04/17/2021 0823   TRIG 296 (H) 02/12/2014 0559   HDL 41 04/17/2021 0823   HDL 29 (L) 02/12/2014 0559   CHOLHDL 4.0 04/17/2021 0823   CHOLHDL 7.3 01/19/2021 0308   VLDL UNABLE TO CALCULATE IF TRIGLYCERIDE OVER 400 mg/dL 01/19/2021 0308   VLDL 59 (H) 02/12/2014 0559   LDLCALC 82 04/17/2021 0823   LDLCALC 66 02/12/2014 0559   LDLDIRECT 114.2 (H) 01/19/2021 0308   Home Medications   Current Meds  Medication Sig   acetaminophen (TYLENOL) 500 MG tablet Take 1,000 mg by mouth every 6 (six) hours as needed (pain).   amLODipine (NORVASC) 10 MG tablet Take 1 tablet (10  mg total) by mouth daily.   aspirin EC 81 MG tablet Take 81 mg by mouth at bedtime.   atorvastatin (LIPITOR) 80 MG tablet Take 1 tablet (80 mg total) by mouth daily.   carvedilol (COREG) 25 MG tablet Take 1 tablet (25 mg total) by mouth 2 (two) times daily.   hydrALAZINE (APRESOLINE) 100 MG tablet Take 1 tablet (100 mg total) by mouth 2 (two) times daily.   hydrochlorothiazide (HYDRODIURIL) 25 MG tablet Take 1 tablet (25 mg total) by mouth daily.   metFORMIN (GLUCOPHAGE-XR) 750 MG 24 hr tablet Take 750 mg by mouth every evening.   nitroGLYCERIN (NITROSTAT) 0.4 MG SL tablet Place 1 tablet (0.4 mg total) under the tongue every 5 (five) minutes as needed.   olmesartan (BENICAR) 40 MG tablet Take 1 tablet (40 mg total) by mouth daily.   venlafaxine XR (EFFEXOR-XR) 150 MG 24 hr capsule Take 150 mg by mouth daily.     Review of Systems      All other systems reviewed and are otherwise  negative except as noted above.  Physical Exam    VS:  BP (!) 160/72   Pulse 82   Ht 5' (1.524 m)   Wt 131 lb (59.4 kg)   BMI 25.58 kg/m  , BMI Body mass index is 25.58 kg/m.  Wt Readings from Last 3 Encounters:  08/12/22 131 lb (59.4 kg)  07/01/22 127 lb (57.6 kg)  05/24/22 126 lb (57.2 kg)     GEN: Well nourished, well developed, in no acute distress. HEENT: normal. Neck: Supple, no JVD, carotid bruits, or masses. Cardiac: RRR, no murmurs, rubs, or gallops. No clubbing, cyanosis, edema.  Radials/PT 2+ and equal bilaterally.  Respiratory:  Respirations regular and unlabored, clear to auscultation bilaterally. GI: Soft, nontender, nondistended. MS: No deformity or atrophy. Skin: Warm and dry, no rash.  Left lateral thigh with baseball sized knot. Neuro:  Strength and sensation are intact. Psych: Normal affect.  Assessment & Plan    CAD s/p DESx2 to LAD 12/2020  Aortic atherosclerosis - Previously completed Brilinta. Stable with no anginal symptoms. No indication for ischemic evaluation.   GDMT Aspirin, Atorvastatin, Carvedilol. Heart healthy diet and regular cardiovascular exercise encouraged.    Fall - Mechanical fall 2 weeks ago. No near syncope, syncope. Left lateral thigh with baseball sized knot which has been gradually decreasing. Encouraged heat application. She has f/u with PCP in 2 weeks and if not improving could consider imaging.   HLD, LDL goal less than 70 -Continue Atorvastatin, resumed at last office visit. . Plan for lipid panel at follow up.   Carotid artery stenosis - 08/2021 carotid duplex with bilateral 1-39% stenosis. Plan for carotid duplex.   HTN - BP not at goal <130/80. While combination pill would help with medication management, she does not have prescription drug coverage. Continue Amlodipine '10mg'$  QD, Coreg '25mg'$  BID, Hydralazine '100mg'$  BID, Olmesartan '40mg'$  QD, HCTZ 25g QD.  Avoid TID dosing of Hydralazine as suspect compliance would be an issue. BMP  tomorrow for monitoring on HCTZ. Rx Imdur '30mg'$  QHS. If BP remains difficult to control consider renal denervation.  Secondary workup:Prior rnal duplex no stenosis. 07/2020 normal TSH. Plan for cortisol, catecholamines, metanephrines to rule out secondary causes.   DM2 - Continue to follow with PCP.   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 Continue optimal BP control. Repeat echo in 1 year already ordered. Continue Carvedilol  Disposition: Follow up in 6-8 weeks Dr. Burt Knack or APP.  Signed, Loel Dubonnet, NP 08/12/2022, 2:22 PM [place]

## 2022-08-13 ENCOUNTER — Telehealth (HOSPITAL_BASED_OUTPATIENT_CLINIC_OR_DEPARTMENT_OTHER): Payer: Self-pay | Admitting: Family

## 2022-08-13 ENCOUNTER — Telehealth (HOSPITAL_BASED_OUTPATIENT_CLINIC_OR_DEPARTMENT_OTHER): Payer: Self-pay

## 2022-08-13 DIAGNOSIS — I6523 Occlusion and stenosis of bilateral carotid arteries: Secondary | ICD-10-CM

## 2022-08-13 NOTE — Telephone Encounter (Addendum)
Attempted to call patient, no answer, left message with the following information, orders placed and sent to scheduling!   ----- Message from Loel Dubonnet, NP sent at 08/12/2022  6:51 PM EST ----- Can we please order repeat carotids? Her last were one year ago and she is due for monitoring. TY!

## 2022-08-13 NOTE — Telephone Encounter (Signed)
Left message for patient to call and discuss scheduling the Carotid duplex ordered by Laurann Montana, NP

## 2022-08-17 LAB — METANEPHRINES, PLASMA
Metanephrine, Free: <25 pg/mL (ref 0.0–88.0)
Normetanephrine, Free: 100.8 pg/mL (ref 0.0–285.2)

## 2022-08-17 LAB — BASIC METABOLIC PANEL
BUN/Creatinine Ratio: 16 (ref 12–28)
BUN: 18 mg/dL (ref 8–27)
CO2: 23 mmol/L (ref 20–29)
Calcium: 9.3 mg/dL (ref 8.7–10.3)
Chloride: 107 mmol/L — ABNORMAL HIGH (ref 96–106)
Creatinine, Ser: 1.12 mg/dL — ABNORMAL HIGH (ref 0.57–1.00)
Glucose: 129 mg/dL — ABNORMAL HIGH (ref 70–99)
Potassium: 4.8 mmol/L (ref 3.5–5.2)
Sodium: 143 mmol/L (ref 134–144)
eGFR: 50 mL/min/{1.73_m2} — ABNORMAL LOW (ref >59)

## 2022-08-17 LAB — CATECHOLAMINES, FRACTIONATED, PLASMA
Dopamine: <30 pg/mL (ref 0–48)
Epinephrine: <15 pg/mL (ref 0–62)
Norepinephrine: 550 pg/mL (ref 0–874)

## 2022-08-17 LAB — CORTISOL: Cortisol: 14.8 ug/dL (ref 6.2–19.4)

## 2022-08-19 ENCOUNTER — Telehealth (HOSPITAL_BASED_OUTPATIENT_CLINIC_OR_DEPARTMENT_OTHER): Payer: Self-pay

## 2022-08-19 NOTE — Telephone Encounter (Addendum)
Results called to patient who verbalizes understanding!     ----- Message from Loel Dubonnet, NP sent at 08/18/2022  7:50 PM EST ----- Normal cortisol, metanephrines, catecholamines. Kidney function slightly decreased from previous - ensure staying well hydrated. Continue same medications.

## 2022-09-16 ENCOUNTER — Ambulatory Visit (HOSPITAL_BASED_OUTPATIENT_CLINIC_OR_DEPARTMENT_OTHER): Payer: Medicare Other | Admitting: Family

## 2022-09-16 ENCOUNTER — Telehealth (HOSPITAL_BASED_OUTPATIENT_CLINIC_OR_DEPARTMENT_OTHER): Payer: Self-pay

## 2022-09-16 ENCOUNTER — Ambulatory Visit (INDEPENDENT_AMBULATORY_CARE_PROVIDER_SITE_OTHER): Payer: Medicare Other

## 2022-09-16 DIAGNOSIS — I6523 Occlusion and stenosis of bilateral carotid arteries: Secondary | ICD-10-CM

## 2022-09-16 NOTE — Telephone Encounter (Signed)
Pt came in today for Carotid doppler. Pt was also checked in for appointment with Laurann Montana, NP, which was to follow Carotid Doppler.  Pt left office after her doppler and has not come back for appointment. Called pt and left vm to call back to reschedule appointment.

## 2022-09-17 ENCOUNTER — Other Ambulatory Visit (HOSPITAL_BASED_OUTPATIENT_CLINIC_OR_DEPARTMENT_OTHER): Payer: Self-pay

## 2022-09-17 DIAGNOSIS — I6523 Occlusion and stenosis of bilateral carotid arteries: Secondary | ICD-10-CM

## 2022-12-27 ENCOUNTER — Other Ambulatory Visit (HOSPITAL_BASED_OUTPATIENT_CLINIC_OR_DEPARTMENT_OTHER): Payer: Self-pay | Admitting: Family

## 2022-12-27 DIAGNOSIS — I10 Essential (primary) hypertension: Secondary | ICD-10-CM

## 2022-12-27 NOTE — Telephone Encounter (Signed)
Per OV note on 08/12/22 w/Caitlin Walker, NP: HTN - BP not at goal <130/80. While combination pill would help with medication management, she does not have prescription drug coverage. Continue Amlodipine 10mg  QD, Coreg 25mg  BID, Hydralazine 100mg  BID, Olmesartan 40mg  QD, HCTZ 25g QD.  Avoid TID dosing of Hydralazine as suspect compliance would be an issue. BMP tomorrow for monitoring on HCTZ.    Alver Sorrow, NP 08/18/2022  7:50 PM EST     Normal cortisol, metanephrines, catecholamines. Kidney function slightly decreased from previous - ensure staying well hydrated. Continue same medications.   Patient was to follow-up in office in 6-8 weeks, but never did (see below).  Jeanene Erb, RMA   09/16/22 11:29 AM Note Pt came in today for Carotid doppler. Pt was also checked in for appointment with Gillian Shields, NP, which was to follow Carotid Doppler.   Pt left office after her doppler and has not come back for appointment. Called pt and left vm to call back to reschedule appointment      Will refill medication for one month only and advise pt to call office for appt.

## 2022-12-27 NOTE — Telephone Encounter (Signed)
Patient of Dr. Cooper. Please review for refill. Thank you!  

## 2023-02-06 ENCOUNTER — Other Ambulatory Visit: Payer: Self-pay | Admitting: Cardiovascular Disease

## 2023-03-04 ENCOUNTER — Emergency Department: Payer: Medicare Other

## 2023-03-04 ENCOUNTER — Other Ambulatory Visit: Payer: Self-pay

## 2023-03-04 ENCOUNTER — Inpatient Hospital Stay
Admission: EM | Admit: 2023-03-04 | Discharge: 2023-03-06 | DRG: 872 | Disposition: A | Discharge status: Home / Self Care | Payer: Medicare Other | Attending: Obstetrics and Gynecology | Admitting: Obstetrics and Gynecology

## 2023-03-04 DIAGNOSIS — N390 Urinary tract infection, site not specified: Principal | ICD-10-CM

## 2023-03-04 DIAGNOSIS — Z1152 Encounter for screening for COVID-19: Secondary | ICD-10-CM

## 2023-03-04 DIAGNOSIS — Z79899 Other long term (current) drug therapy: Secondary | ICD-10-CM

## 2023-03-04 DIAGNOSIS — E785 Hyperlipidemia, unspecified: Secondary | ICD-10-CM

## 2023-03-04 DIAGNOSIS — E119 Type 2 diabetes mellitus without complications: Secondary | ICD-10-CM | POA: Diagnosis present

## 2023-03-04 DIAGNOSIS — F32A Depression, unspecified: Secondary | ICD-10-CM | POA: Diagnosis present

## 2023-03-04 DIAGNOSIS — N12 Tubulo-interstitial nephritis, not specified as acute or chronic: Secondary | ICD-10-CM

## 2023-03-04 DIAGNOSIS — I252 Old myocardial infarction: Secondary | ICD-10-CM

## 2023-03-04 DIAGNOSIS — Z955 Presence of coronary angioplasty implant and graft: Secondary | ICD-10-CM

## 2023-03-04 DIAGNOSIS — Z823 Family history of stroke: Secondary | ICD-10-CM

## 2023-03-04 DIAGNOSIS — Z90710 Acquired absence of both cervix and uterus: Secondary | ICD-10-CM

## 2023-03-04 DIAGNOSIS — I2510 Atherosclerotic heart disease of native coronary artery without angina pectoris: Secondary | ICD-10-CM | POA: Diagnosis present

## 2023-03-04 DIAGNOSIS — A4150 Gram-negative sepsis, unspecified: Principal | ICD-10-CM | POA: Diagnosis present

## 2023-03-04 DIAGNOSIS — D7589 Other specified diseases of blood and blood-forming organs: Secondary | ICD-10-CM | POA: Diagnosis present

## 2023-03-04 DIAGNOSIS — E860 Dehydration: Secondary | ICD-10-CM | POA: Diagnosis present

## 2023-03-04 DIAGNOSIS — Z7984 Long term (current) use of oral hypoglycemic drugs: Secondary | ICD-10-CM

## 2023-03-04 DIAGNOSIS — N179 Acute kidney failure, unspecified: Secondary | ICD-10-CM | POA: Diagnosis present

## 2023-03-04 DIAGNOSIS — K219 Gastro-esophageal reflux disease without esophagitis: Secondary | ICD-10-CM | POA: Diagnosis present

## 2023-03-04 DIAGNOSIS — D649 Anemia, unspecified: Secondary | ICD-10-CM | POA: Diagnosis present

## 2023-03-04 DIAGNOSIS — Z8249 Family history of ischemic heart disease and other diseases of the circulatory system: Secondary | ICD-10-CM

## 2023-03-04 DIAGNOSIS — F419 Anxiety disorder, unspecified: Secondary | ICD-10-CM | POA: Diagnosis present

## 2023-03-04 DIAGNOSIS — E872 Acidosis, unspecified: Secondary | ICD-10-CM | POA: Diagnosis present

## 2023-03-04 DIAGNOSIS — I10 Essential (primary) hypertension: Secondary | ICD-10-CM | POA: Diagnosis present

## 2023-03-04 DIAGNOSIS — N10 Acute tubulo-interstitial nephritis: Secondary | ICD-10-CM | POA: Diagnosis present

## 2023-03-04 DIAGNOSIS — E7800 Pure hypercholesterolemia, unspecified: Secondary | ICD-10-CM | POA: Diagnosis present

## 2023-03-04 DIAGNOSIS — A419 Sepsis, unspecified organism: Principal | ICD-10-CM

## 2023-03-04 LAB — CBC WITH DIFFERENTIAL/PLATELET
Abs Immature Granulocytes: 0.17 10*3/uL — ABNORMAL HIGH (ref 0.00–0.07)
Basophils Absolute: 0 10*3/uL (ref 0.0–0.1)
Basophils Relative: 0 %
Eosinophils Absolute: 0 10*3/uL (ref 0.0–0.5)
Eosinophils Relative: 0 %
HCT: 31.5 % — ABNORMAL LOW (ref 36.0–46.0)
Hemoglobin: 10.2 g/dL — ABNORMAL LOW (ref 12.0–15.0)
Immature Granulocytes: 1 %
Lymphocytes Relative: 2 %
Lymphs Abs: 0.4 10*3/uL — ABNORMAL LOW (ref 0.7–4.0)
MCH: 33.3 pg (ref 26.0–34.0)
MCHC: 32.4 g/dL (ref 30.0–36.0)
MCV: 102.9 fL — ABNORMAL HIGH (ref 80.0–100.0)
Monocytes Absolute: 0.4 10*3/uL (ref 0.1–1.0)
Monocytes Relative: 2 %
Neutro Abs: 17.3 10*3/uL — ABNORMAL HIGH (ref 1.7–7.7)
Neutrophils Relative %: 95 %
Platelets: 193 10*3/uL (ref 150–400)
RBC: 3.06 MIL/uL — ABNORMAL LOW (ref 3.87–5.11)
RDW: 14 % (ref 11.5–15.5)
WBC: 18.4 10*3/uL — ABNORMAL HIGH (ref 4.0–10.5)
nRBC: 0 % (ref 0.0–0.2)

## 2023-03-04 LAB — COMPREHENSIVE METABOLIC PANEL
ALT: 21 U/L (ref 0–44)
AST: 32 U/L (ref 15–41)
Albumin: 3.6 g/dL (ref 3.5–5.0)
Alkaline Phosphatase: 105 U/L (ref 38–126)
Anion gap: 12 (ref 5–15)
BUN: 40 mg/dL — ABNORMAL HIGH (ref 8–23)
CO2: 19 mmol/L — ABNORMAL LOW (ref 22–32)
Calcium: 7.8 mg/dL — ABNORMAL LOW (ref 8.9–10.3)
Chloride: 99 mmol/L (ref 98–111)
Creatinine, Ser: 1.99 mg/dL — ABNORMAL HIGH (ref 0.44–1.00)
GFR, Estimated: 25 mL/min — ABNORMAL LOW (ref >60)
Glucose, Bld: 181 mg/dL — ABNORMAL HIGH (ref 70–99)
Potassium: 3.9 mmol/L (ref 3.5–5.1)
Sodium: 130 mmol/L — ABNORMAL LOW (ref 135–145)
Total Bilirubin: 1 mg/dL (ref 0.3–1.2)
Total Protein: 6.5 g/dL (ref 6.5–8.1)

## 2023-03-04 NOTE — ED Triage Notes (Signed)
Pt states began having chills last pm. Pt denies headache, vomiting, states has been having diarrhea. Pt states has also had burning with urination.

## 2023-03-04 NOTE — ED Provider Notes (Signed)
[facility] Provider Note    Event Date/Time   First MD Initiated Contact with Patient 03/04/23 2332     (approximate)   History   Chills   HPI  Elizabeth Mcmillan is a 82 y.o. female who presents to the ED for evaluation of Chills   I review PCP visit from 6/3. Hx HTN, HLD, DM. CAD  Patient presents to the ED alongside her boyfriend for evaluation of acute chills superimposed on subacute dysuria and urinary hesitancy.  She reports urinary symptoms since last week but has become increasingly weak, subjective chills and fevers over the past few days.   Physical Exam   Triage Vital Signs: ED Triage Vitals  Encounter Vitals Group     BP 03/04/23 2325 (!) 144/57     Systolic BP Percentile --      Diastolic BP Percentile --      Pulse Rate 03/04/23 2323 (!) 120     Resp 03/04/23 2323 (!) 22     Temp 03/04/23 2325 99 F (37.2 C)     Temp Source 03/04/23 2323 Oral     SpO2 03/04/23 2323 94 %     Weight 03/04/23 2323 128 lb (58.1 kg)     Height 03/04/23 2323 5' (1.524 m)     Head Circumference --      Peak Flow --      Pain Score --      Pain Loc --      Pain Education --      Exclude from Growth Chart --     Most recent vital signs: Vitals:   03/05/23 0045 03/05/23 0114  BP:  (!) 120/57  Pulse: (!) 105   Resp:    Temp:    SpO2: 94%     General: Awake, no distress.  Well-appearing and conversational CV:  Good peripheral perfusion.  Resp:  Normal effort.  Abd:  No distention.  Suprapubic tenderness, otherwise benign.  Left-sided CVA tenderness is present MSK:  No deformity noted.  Neuro:  No focal deficits appreciated. Other:     ED Results / Procedures / Treatments   Labs (all labs ordered are listed, but only abnormal results are displayed) Labs Reviewed  COMPREHENSIVE METABOLIC PANEL - Abnormal; Notable for the following components:      Result Value   Sodium 130 (*)    CO2 19 (*)    Glucose, Bld 181 (*)    BUN 40 (*)     Creatinine, Ser 1.99 (*)    Calcium 7.8 (*)    GFR, Estimated 25 (*)    All other components within normal limits  LACTIC ACID, PLASMA - Abnormal; Notable for the following components:   Lactic Acid, Venous 2.0 (*)    All other components within normal limits  CBC WITH DIFFERENTIAL/PLATELET - Abnormal; Notable for the following components:   WBC 18.4 (*)    RBC 3.06 (*)    Hemoglobin 10.2 (*)    HCT 31.5 (*)    MCV 102.9 (*)    Neutro Abs 17.3 (*)    Lymphs Abs 0.4 (*)    Abs Immature Granulocytes 0.17 (*)    All other components within normal limits  URINALYSIS, W/ REFLEX TO CULTURE (INFECTION SUSPECTED) - Abnormal; Notable for the following components:   Color, Urine YELLOW (*)    APPearance CLOUDY (*)    Hgb urine dipstick MODERATE (*)    Protein, ur 100 (*)    Leukocytes,Ua  MODERATE (*)    All other components within normal limits  SARS CORONAVIRUS 2 BY RT PCR  CULTURE, BLOOD (ROUTINE X 2)  CULTURE, BLOOD (ROUTINE X 2)  URINE CULTURE  PROTIME-INR  PROCALCITONIN  LACTIC ACID, PLASMA    EKG   RADIOLOGY CT renal study interpreted by me without urologic obstruction.  Perinephric stranding is noted on the left\ CXR interpreted by me without evidence of acute cardiopulmonary pathology.  Official radiology report(s): CT Renal Stone Study  Result Date: 03/05/2023 CLINICAL DATA:  Urosepsis, left CVA tenderness EXAM: CT ABDOMEN AND PELVIS WITHOUT CONTRAST TECHNIQUE: Multidetector CT imaging of the abdomen and pelvis was performed following the standard protocol without IV contrast. RADIATION DOSE REDUCTION: This exam was performed according to the departmental dose-optimization program which includes automated exposure control, adjustment of the mA and/or kV according to patient size and/or use of iterative reconstruction technique. COMPARISON:  04/22/2017 FINDINGS: Lower chest: Mild left basilar atelectasis. Hepatobiliary: Unenhanced liver is unremarkable. Gallbladder is  unremarkable. No intrahepatic or extrahepatic dilatation but Pancreas: Within normal limits. Spleen: Within normal limits but Adrenals/Urinary Tract: Stable 13 mm right adrenal nodule, measuring 31 HUs and therefore technically indeterminate, but unchanged from 2018. Given stability, this is compatible with a benign adrenal adenoma, and no follow-up is recommended. Left adrenal gland is within normal limits. 7 mm hyperdense/hemorrhagic right lower pole renal lesion (series 2/image 31), too small to characterize but likely reflecting a benign hemorrhagic cyst. No follow-up is recommended. Similar 5 mm probable hemorrhagic cyst on the left (series 2/image 29), although poorly visualized. Nonspecific perinephric stranding along the left kidney (series 2/image 29). Punctate nonobstructing right lower pole renal calculus (series 2/image 33). No ureteral or bladder calculi. No hydronephrosis. Bladder is within normal limits. Stomach/Bowel: Stomach is within normal limits. No evidence of bowel obstruction. Normal appendix (series 2/image 50). Status post left hemicolectomy with suture line in the lower pelvis (series 2/image 61). Vascular/Lymphatic: No evidence of abdominal aortic aneurysm. Atherosclerotic calcifications of the abdominal aorta and branch vessels. No suspicious abdominopelvic lymphadenopathy. Reproductive: Uterus is within normal limits. No adnexal masses. Other: No abdominopelvic ascites. Musculoskeletal: Degenerative changes of the visualized thoracolumbar spine. IMPRESSION: Nonspecific perinephric stranding along the left kidney, which could reflect pyelonephritis in the appropriate clinical setting. Correlate with urinalysis. Punctate nonobstructing right lower pole renal calculus. No ureteral or bladder calculi. No hydronephrosis. Electronically Signed   By: Charline Bills M.D.   On: 03/05/2023 00:50   DG Chest Port 1 View  Result Date: 03/05/2023 CLINICAL DATA:  Chills EXAM: PORTABLE CHEST 1  VIEW COMPARISON:  08/29/2021 FINDINGS: Lungs are clear.  No pleural effusion or pneumothorax. The heart is normal in size.  Mild thoracic aortic atherosclerosis. IMPRESSION: No acute cardiopulmonary disease. Electronically Signed   By: Charline Bills M.D.   On: 03/05/2023 00:35    PROCEDURES and INTERVENTIONS:  .1-3 Lead EKG Interpretation  Performed by: Delton Prairie, MD Authorized by: Delton Prairie, MD     Interpretation: abnormal     ECG rate:  104   ECG rate assessment: tachycardic     Rhythm: sinus tachycardia     Ectopy: none     Conduction: normal   .Critical Care  Performed by: Delton Prairie, MD Authorized by: Delton Prairie, MD   Critical care provider statement:    Critical care time (minutes):  30   Critical care time was exclusive of:  Separately billable procedures and treating other patients   Critical care was necessary to treat or  prevent imminent or life-threatening deterioration of the following conditions:  Sepsis   Critical care was time spent personally by me on the following activities:  Development of treatment plan with patient or surrogate, discussions with consultants, evaluation of patient's response to treatment, examination of patient, ordering and review of laboratory studies, ordering and review of radiographic studies, ordering and performing treatments and interventions, pulse oximetry, re-evaluation of patient's condition and review of old charts   Medications  lactated ringers infusion (has no administration in time range)  cefTRIAXone (ROCEPHIN) 1 g in sodium chloride 0.9 % 100 mL IVPB (0 g Intravenous Stopped 03/05/23 0103)  sodium chloride 0.9 % bolus 2,000 mL (2,000 mLs Intravenous New Bag/Given 03/05/23 0021)     IMPRESSION / MDM / ASSESSMENT AND PLAN / ED COURSE  I reviewed the triage vital signs and the nursing notes.  Differential diagnosis includes, but is not limited to, sepsis, cystitis or pyelonephritis, COVID-19 or other viral infection,  dehydration or AKI  {Patient presents with symptoms of an acute illness or injury that is potentially life-threatening.  Patient presents from home with evidence of urosepsis from pyelonephritis on the left side requiring medical admission.  Look systemically well.  Tachycardic but stable.  Left CVA tenderness and suprapubic tenderness without peritoneal abdominal tenderness.  Blood work with leukocytosis, mild lactic acidosis.  Elevated procalcitonin.  AKI that I suspect is prerenal.  No evidence of urologic obstruction on CT imaging.  Cultures drawn, started on ceftriaxone and we will consult with medicine for admission.  Clinical Course as of 03/05/23 0118  Wed Mar 05, 2023  0116 Reassessed, discussed sepsis, pyelonephritis and my recommendations for admission and she is agreeable. [DS]    Clinical Course User Index [DS] Delton Prairie, MD     FINAL CLINICAL IMPRESSION(S) / ED DIAGNOSES   Final diagnoses:  Sepsis due to urinary tract infection (HCC)  AKI (acute kidney injury) (HCC)  Pyelonephritis     Rx / DC Orders   ED Discharge Orders     None        Note:  This document was prepared using Dragon voice recognition software and may include unintentional dictation errors.   Delton Prairie, MD 03/05/23 [phone]

## 2023-03-05 ENCOUNTER — Encounter: Payer: Self-pay | Admitting: Family Medicine

## 2023-03-05 ENCOUNTER — Emergency Department: Payer: Medicare Other

## 2023-03-05 DIAGNOSIS — Z955 Presence of coronary angioplasty implant and graft: Secondary | ICD-10-CM | POA: Diagnosis not present

## 2023-03-05 DIAGNOSIS — N179 Acute kidney failure, unspecified: Secondary | ICD-10-CM | POA: Diagnosis present

## 2023-03-05 DIAGNOSIS — Z823 Family history of stroke: Secondary | ICD-10-CM | POA: Diagnosis not present

## 2023-03-05 DIAGNOSIS — N12 Tubulo-interstitial nephritis, not specified as acute or chronic: Secondary | ICD-10-CM | POA: Diagnosis present

## 2023-03-05 DIAGNOSIS — N10 Acute tubulo-interstitial nephritis: Secondary | ICD-10-CM | POA: Diagnosis present

## 2023-03-05 DIAGNOSIS — Z8249 Family history of ischemic heart disease and other diseases of the circulatory system: Secondary | ICD-10-CM | POA: Diagnosis not present

## 2023-03-05 DIAGNOSIS — N390 Urinary tract infection, site not specified: Secondary | ICD-10-CM

## 2023-03-05 DIAGNOSIS — Z79899 Other long term (current) drug therapy: Secondary | ICD-10-CM | POA: Diagnosis not present

## 2023-03-05 DIAGNOSIS — I2510 Atherosclerotic heart disease of native coronary artery without angina pectoris: Secondary | ICD-10-CM | POA: Diagnosis present

## 2023-03-05 DIAGNOSIS — K219 Gastro-esophageal reflux disease without esophagitis: Secondary | ICD-10-CM | POA: Diagnosis present

## 2023-03-05 DIAGNOSIS — Z90710 Acquired absence of both cervix and uterus: Secondary | ICD-10-CM | POA: Diagnosis not present

## 2023-03-05 DIAGNOSIS — Z7984 Long term (current) use of oral hypoglycemic drugs: Secondary | ICD-10-CM | POA: Diagnosis not present

## 2023-03-05 DIAGNOSIS — F419 Anxiety disorder, unspecified: Secondary | ICD-10-CM | POA: Diagnosis present

## 2023-03-05 DIAGNOSIS — A4150 Gram-negative sepsis, unspecified: Secondary | ICD-10-CM | POA: Diagnosis present

## 2023-03-05 DIAGNOSIS — E119 Type 2 diabetes mellitus without complications: Secondary | ICD-10-CM | POA: Diagnosis present

## 2023-03-05 DIAGNOSIS — E872 Acidosis, unspecified: Secondary | ICD-10-CM | POA: Diagnosis present

## 2023-03-05 DIAGNOSIS — Z1152 Encounter for screening for COVID-19: Secondary | ICD-10-CM | POA: Diagnosis not present

## 2023-03-05 DIAGNOSIS — I10 Essential (primary) hypertension: Secondary | ICD-10-CM

## 2023-03-05 DIAGNOSIS — I252 Old myocardial infarction: Secondary | ICD-10-CM | POA: Diagnosis not present

## 2023-03-05 DIAGNOSIS — F32A Depression, unspecified: Secondary | ICD-10-CM | POA: Insufficient documentation

## 2023-03-05 DIAGNOSIS — E860 Dehydration: Secondary | ICD-10-CM | POA: Diagnosis present

## 2023-03-05 DIAGNOSIS — E785 Hyperlipidemia, unspecified: Secondary | ICD-10-CM

## 2023-03-05 DIAGNOSIS — D7589 Other specified diseases of blood and blood-forming organs: Secondary | ICD-10-CM | POA: Diagnosis present

## 2023-03-05 DIAGNOSIS — D649 Anemia, unspecified: Secondary | ICD-10-CM | POA: Diagnosis present

## 2023-03-05 DIAGNOSIS — E7800 Pure hypercholesterolemia, unspecified: Secondary | ICD-10-CM | POA: Diagnosis present

## 2023-03-05 LAB — CBC
HCT: 26.4 % — ABNORMAL LOW (ref 36.0–46.0)
Hemoglobin: 8.8 g/dL — ABNORMAL LOW (ref 12.0–15.0)
MCH: 33.7 pg (ref 26.0–34.0)
MCHC: 33.3 g/dL (ref 30.0–36.0)
MCV: 101.1 fL — ABNORMAL HIGH (ref 80.0–100.0)
Platelets: 161 10*3/uL (ref 150–400)
RBC: 2.61 MIL/uL — ABNORMAL LOW (ref 3.87–5.11)
RDW: 14.1 % (ref 11.5–15.5)
WBC: 24.6 10*3/uL — ABNORMAL HIGH (ref 4.0–10.5)
nRBC: 0 % (ref 0.0–0.2)

## 2023-03-05 LAB — BASIC METABOLIC PANEL
Anion gap: 6 (ref 5–15)
BUN: 33 mg/dL — ABNORMAL HIGH (ref 8–23)
CO2: 23 mmol/L (ref 22–32)
Calcium: 7.3 mg/dL — ABNORMAL LOW (ref 8.9–10.3)
Chloride: 106 mmol/L (ref 98–111)
Creatinine, Ser: 1.56 mg/dL — ABNORMAL HIGH (ref 0.44–1.00)
GFR, Estimated: 33 mL/min — ABNORMAL LOW (ref >60)
Glucose, Bld: 196 mg/dL — ABNORMAL HIGH (ref 70–99)
Potassium: 3.6 mmol/L (ref 3.5–5.1)
Sodium: 135 mmol/L (ref 135–145)

## 2023-03-05 LAB — PROTIME-INR
INR: 1.2 (ref 0.8–1.2)
Prothrombin Time: 15.5 seconds — ABNORMAL HIGH (ref 11.4–15.2)

## 2023-03-05 LAB — PROCALCITONIN: Procalcitonin: 93.35 ng/mL

## 2023-03-05 LAB — CORTISOL-AM, BLOOD: Cortisol - AM: 19.2 ug/dL (ref 6.7–22.6)

## 2023-03-05 MED ORDER — DIPHENHYDRAMINE-APAP (SLEEP) 25-500 MG PO TABS: 1.0000 | ORAL_TABLET | Freq: Every day | ORAL | Status: DC

## 2023-03-05 MED ORDER — SODIUM CHLORIDE 0.9 % IV SOLN: INTRAVENOUS | Status: DC

## 2023-03-05 MED ORDER — TRAZODONE HCL 50 MG PO TABS
25.0000 mg | ORAL_TABLET | Freq: Every evening | ORAL | Status: DC | PRN
  Filled 2023-03-05: qty 1

## 2023-03-05 MED ORDER — VENLAFAXINE HCL ER 150 MG PO CP24
150.0000 mg | ORAL_CAPSULE | Freq: Every day | ORAL | Status: DC
  Administered 2023-03-05 – 2023-03-06 (×2): 150 mg via ORAL
  Filled 2023-03-05 (×2): qty 1

## 2023-03-05 MED ORDER — ONDANSETRON HCL 4 MG/2ML IJ SOLN: 4.0000 mg | Freq: Four times a day (QID) | INTRAMUSCULAR | Status: DC | PRN

## 2023-03-05 MED ORDER — SODIUM CHLORIDE 0.9 % IV BOLUS
2000.0000 mL | Freq: Once | INTRAVENOUS | Status: AC
  Administered 2023-03-05: 2000 mL via INTRAVENOUS

## 2023-03-05 MED ORDER — LACTATED RINGERS IV SOLN: INTRAVENOUS | Status: DC

## 2023-03-05 MED ORDER — DIPHENHYDRAMINE HCL 25 MG PO CAPS
25.0000 mg | ORAL_CAPSULE | Freq: Every day | ORAL | Status: DC
  Administered 2023-03-05: 25 mg via ORAL
  Filled 2023-03-05: qty 1

## 2023-03-05 MED ORDER — ATORVASTATIN CALCIUM 20 MG PO TABS
80.0000 mg | ORAL_TABLET | Freq: Every day | ORAL | Status: DC
  Administered 2023-03-05 – 2023-03-06 (×2): 80 mg via ORAL
  Filled 2023-03-05 (×2): qty 4

## 2023-03-05 MED ORDER — ASPIRIN 81 MG PO TBEC
81.0000 mg | DELAYED_RELEASE_TABLET | Freq: Every day | ORAL | Status: DC
  Administered 2023-03-05: 81 mg via ORAL
  Filled 2023-03-05: qty 1

## 2023-03-05 MED ORDER — AMLODIPINE BESYLATE 10 MG PO TABS
10.0000 mg | ORAL_TABLET | Freq: Every day | ORAL | Status: DC
  Administered 2023-03-05: 10 mg via ORAL
  Filled 2023-03-05: qty 1

## 2023-03-05 MED ORDER — MAGNESIUM HYDROXIDE 400 MG/5ML PO SUSP: 30.0000 mL | Freq: Every day | ORAL | Status: DC | PRN

## 2023-03-05 MED ORDER — KETOROLAC TROMETHAMINE 0.5 % OP SOLN
1.0000 [drp] | Freq: Four times a day (QID) | OPHTHALMIC | Status: DC
  Filled 2023-03-05: qty 3

## 2023-03-05 MED ORDER — ACETAMINOPHEN 325 MG RE SUPP: 650.0000 mg | Freq: Four times a day (QID) | RECTAL | Status: DC | PRN

## 2023-03-05 MED ORDER — CARVEDILOL 25 MG PO TABS
25.0000 mg | ORAL_TABLET | Freq: Two times a day (BID) | ORAL | Status: DC
  Administered 2023-03-05 – 2023-03-06 (×3): 25 mg via ORAL
  Filled 2023-03-05 (×3): qty 1

## 2023-03-05 MED ORDER — KETOROLAC TROMETHAMINE 0.5 % OP SOLN
1.0000 [drp] | Freq: Three times a day (TID) | OPHTHALMIC | Status: DC
  Administered 2023-03-05: 1 [drp] via OPHTHALMIC
  Filled 2023-03-05: qty 3

## 2023-03-05 MED ORDER — LACTATED RINGERS IV SOLN
150.0000 mL/h | INTRAVENOUS | Status: DC
  Administered 2023-03-05: 150 mL/h via INTRAVENOUS

## 2023-03-05 MED ORDER — KETOROLAC TROMETHAMINE 0.5 % OP SOLN
1.0000 [drp] | Freq: Four times a day (QID) | OPHTHALMIC | Status: DC
  Administered 2023-03-05: 1 [drp] via OPHTHALMIC
  Filled 2023-03-05: qty 3

## 2023-03-05 MED ORDER — ACETAMINOPHEN 500 MG PO TABS
500.0000 mg | ORAL_TABLET | Freq: Every day | ORAL | Status: DC
  Administered 2023-03-05: 500 mg via ORAL
  Filled 2023-03-05: qty 1

## 2023-03-05 MED ORDER — HYDRALAZINE HCL 50 MG PO TABS
100.0000 mg | ORAL_TABLET | Freq: Two times a day (BID) | ORAL | Status: DC
  Administered 2023-03-05: 100 mg via ORAL
  Filled 2023-03-05: qty 2

## 2023-03-05 MED ORDER — SODIUM CHLORIDE 0.9 % IV SOLN
1.0000 g | Freq: Once | INTRAVENOUS | Status: AC
  Administered 2023-03-05: 1 g via INTRAVENOUS
  Filled 2023-03-05 (×2): qty 10

## 2023-03-05 MED ORDER — FAMOTIDINE 20 MG PO TABS
20.0000 mg | ORAL_TABLET | Freq: Every day | ORAL | Status: DC
  Administered 2023-03-05 – 2023-03-06 (×2): 20 mg via ORAL
  Filled 2023-03-05 (×2): qty 1

## 2023-03-05 MED ORDER — IRBESARTAN 75 MG PO TABS: 37.5000 mg | ORAL_TABLET | Freq: Every day | ORAL | Status: DC

## 2023-03-05 MED ORDER — PREDNISOLONE ACETATE 1 % OP SUSP
1.0000 [drp] | Freq: Four times a day (QID) | OPHTHALMIC | Status: DC
  Administered 2023-03-05 (×2): 1 [drp] via OPHTHALMIC
  Filled 2023-03-05: qty 1

## 2023-03-05 MED ORDER — ACETAMINOPHEN 325 MG PO TABS: 650.0000 mg | ORAL_TABLET | Freq: Four times a day (QID) | ORAL | Status: DC | PRN

## 2023-03-05 MED ORDER — ISOSORBIDE MONONITRATE ER 30 MG PO TB24
30.0000 mg | ORAL_TABLET | Freq: Every day | ORAL | Status: DC
  Administered 2023-03-05: 30 mg via ORAL
  Filled 2023-03-05: qty 1

## 2023-03-05 MED ORDER — ONDANSETRON HCL 4 MG PO TABS: 4.0000 mg | ORAL_TABLET | Freq: Four times a day (QID) | ORAL | Status: DC | PRN

## 2023-03-05 MED ORDER — ENOXAPARIN SODIUM 30 MG/0.3ML IJ SOSY
30.0000 mg | PREFILLED_SYRINGE | INTRAMUSCULAR | Status: DC
  Administered 2023-03-05 – 2023-03-06 (×2): 30 mg via SUBCUTANEOUS
  Filled 2023-03-05 (×2): qty 0.3

## 2023-03-05 MED ORDER — NITROGLYCERIN 0.4 MG SL SUBL: 0.4000 mg | SUBLINGUAL_TABLET | SUBLINGUAL | Status: DC | PRN

## 2023-03-05 MED ORDER — SODIUM CHLORIDE 0.9 % IV SOLN
2.0000 g | INTRAVENOUS | Status: DC
  Administered 2023-03-05: 2 g via INTRAVENOUS
  Filled 2023-03-05: qty 20

## 2023-03-05 MED ORDER — DEXTROMETHORPHAN POLISTIREX ER 30 MG/5ML PO SUER: 15.0000 mg | Freq: Two times a day (BID) | ORAL | Status: DC | PRN

## 2023-03-05 MED ORDER — PREDNISOLONE ACETATE 1 % OP SUSP
1.0000 [drp] | Freq: Four times a day (QID) | OPHTHALMIC | Status: DC
  Filled 2023-03-05: qty 1

## 2023-03-05 MED ORDER — MENTHOL 3 MG MT LOZG
1.0000 | LOZENGE | OROMUCOSAL | Status: DC | PRN
  Administered 2023-03-05: 3 mg via ORAL
  Filled 2023-03-05: qty 9

## 2023-03-05 MED ORDER — PREDNISOLONE ACETATE 1 % OP SUSP
1.0000 [drp] | Freq: Three times a day (TID) | OPHTHALMIC | Status: DC
  Administered 2023-03-05 – 2023-03-06 (×2): 1 [drp] via OPHTHALMIC
  Filled 2023-03-05: qty 1

## 2023-03-05 MED ORDER — SODIUM CHLORIDE 0.9 % IV SOLN
1.0000 g | Freq: Once | INTRAVENOUS | Status: AC
  Administered 2023-03-05: 1 g via INTRAVENOUS
  Filled 2023-03-05: qty 10

## 2023-03-05 NOTE — Progress Notes (Signed)
Anticoagulation monitoring(Lovenox):  82 yo female ordered Lovenox 4 mg Q24h    Filed Weights   03/04/23 2323  Weight: 58.1 kg (128 lb)   BMI 25    Lab Results  Component Value Date   CREATININE 1.99 (H) 03/04/2023   CREATININE 1.12 (H) 08/13/2022   CREATININE 0.90 08/29/2021   Estimated Creatinine Clearance: 18 mL/min (A) (by C-G formula based on SCr of 1.99 mg/dL (H)). Hemoglobin & Hematocrit     Component Value Date/Time   HGB 10.2 (L) 03/04/2023 2335   HGB 12.3 02/11/2014 0952   HCT 31.5 (L) 03/04/2023 2335   HCT 37.7 02/11/2014 0952     Per Protocol for Patient with estCrcl < 30 ml/min and BMI < 40, will transition to Lovenox 30 mg Q24h.

## 2023-03-05 NOTE — Sepsis Progress Note (Signed)
Elink monitoring for the code sepsis protocol.  

## 2023-03-05 NOTE — Assessment & Plan Note (Addendum)
-   We will continue her Effexor XR.

## 2023-03-05 NOTE — Progress Notes (Signed)
Interim progress note not for billing  Admitted earlier this morning by my colleague. Agree with h and p unless otherwise stated. Here with left pyelonephritis, no infectious complication seen on CT. Hemodynamically stable. Also with mild AKI that is responding to fluids. Blood and urine cultures are in process. I will continue IVF and ceftriaxone and we will re-assess tomorrow. Anticipate d/c in the next 24-48 hours. Will order PT consult for tomorrow morning given her chief complaint is general weakness.

## 2023-03-05 NOTE — Plan of Care (Signed)

## 2023-03-05 NOTE — H&P (Signed)
[place]   PATIENT NAME: Elizabeth Mcmillan    MR#:  [id number]  DATE OF BIRTH:  14-Apr-1941  DATE OF ADMISSION:  03/04/2023  PRIMARY CARE PHYSICIAN: Wilford Corner, PA-C   Patient is coming from: Home  REQUESTING/REFERRING PHYSICIAN: Delton Prairie, MD  CHIEF COMPLAINT:   Chief Complaint  Patient presents with   Chills    HISTORY OF PRESENT ILLNESS:  Elizabeth Mcmillan is a 82 y.o. Caucasian female with medical history significant for anxiety, cardiac disease, type diabetes mellitus, GERD, dyslipidemia, hypertension and coronary artery disease s/p NSTEMI, who presented to the ER with acute onset of left-sided flank pain as well as associated dysuria, urinary frequency and urgency.  She denies any nausea or vomiting.  No chest pain or dyspnea or cough or wheezing.  She has been having chills but denies any measured fever.  ED Course: When she came to the ER, BP was 144/57 with heart rate of 120 and respiratory to 22, temperature 99 approximately 94% on room air.  Labs revealed sodium 130 with a CO2 of 19 and blood glucose 181, BUN of 40 and creatinine 1.99 compared to 18/1.12 on 08/13/2022 with a calcium of 7.8.  Lactic acid was 2 and later 1.  Procalcitonin was 45.74 and CBC showed significant leukocytosis of 18.4 with neutrophilia and anemia worse than previous levels with macrocytosis.  UA was positive for UTI. EKG as reviewed by me : None Imaging: CT renal stone showed the following: Nonspecific perinephric stranding along the left kidney, which could reflect pyelonephritis in the appropriate clinical setting. Correlate with urinalysis.   Punctate nonobstructing right lower pole renal calculus. No ureteral or bladder calculi. No hydronephrosis.  Patient was given IV Rocephin and 2 L bolus of IV normal saline.  She will be admitted to a medical telemetry bed for further evaluation and management.  PAST MEDICAL HISTORY:   Past Medical History:  Diagnosis Date    Anxiety    CAD (coronary artery disease) 08/30/2021   Diabetes mellitus without complication (HCC)    GERD (gastroesophageal reflux disease)    Hypercholesteremia    Hypertension    NSTEMI (non-ST elevated myocardial infarction) (HCC) 01/17/2021    PAST SURGICAL HISTORY:   Past Surgical History:  Procedure Laterality Date   ABDOMINAL HYSTERECTOMY     BREAST EXCISIONAL BIOPSY Left years ago   negative   COLON SURGERY     COLONOSCOPY WITH PROPOFOL N/A 09/04/2016   Procedure: COLONOSCOPY WITH PROPOFOL;  Surgeon: Scot Jun, MD;  Location: [facility] ENDOSCOPY;  Service: Endoscopy;  Laterality: N/A;   CORONARY STENT INTERVENTION N/A 01/18/2021   Procedure: CORONARY STENT INTERVENTION;  Surgeon: Runell Gess, MD;  Location: [facility] INVASIVE CV LAB;  Service: Cardiovascular;  Laterality: N/A;   CORONARY ULTRASOUND/IVUS N/A 01/18/2021   Procedure: Intravascular Ultrasound/IVUS;  Surgeon: Runell Gess, MD;  Location: [facility] INVASIVE CV LAB;  Service: Cardiovascular;  Laterality: N/A;   LEFT HEART CATH AND CORONARY ANGIOGRAPHY N/A 01/18/2021   Procedure: LEFT HEART CATH AND CORONARY ANGIOGRAPHY;  Surgeon: Runell Gess, MD;  Location: [facility] INVASIVE CV LAB;  Service: Cardiovascular;  Laterality: N/A;   LUMBAR LAMINECTOMY/ DECOMPRESSION WITH MET-RX Right 05/09/2015   Procedure: Far Lateral Lumbar Three-Four Microdiscectomy WITH MET-RX;  Surgeon: Maeola Harman, MD;  Location: [facility] NEURO ORS;  Service: Neurosurgery;  Laterality: Right;    SOCIAL HISTORY:   Social History   Tobacco Use   Smoking status: Never   Smokeless tobacco: Never  Substance Use Topics   Alcohol use: No    FAMILY HISTORY:   Family History  Problem Relation Age of Onset   Heart attack Father    Stroke Father    Coronary artery disease Father    Coronary artery disease Paternal Grandfather    Heart attack Paternal Grandfather    Breast cancer Neg Hx     DRUG ALLERGIES:  No Known Allergies  REVIEW OF SYSTEMS:    ROS As per history of present illness. All pertinent systems were reviewed above. Constitutional, HEENT, cardiovascular, respiratory, GI, GU, musculoskeletal, neuro, psychiatric, endocrine, integumentary and hematologic systems were reviewed and are otherwise negative/unremarkable except for positive findings mentioned above in the HPI.   MEDICATIONS AT HOME:   Prior to Admission medications   Medication Sig Start Date End Date Taking? Authorizing Provider  acetaminophen (TYLENOL) 500 MG tablet Take 1,000 mg by mouth every 6 (six) hours as needed (pain).   Yes [provider]  amLODipine (NORVASC) 10 MG tablet Take 1 tablet by mouth daily.   Yes [provider]  aspirin EC 81 MG tablet Take 81 mg by mouth at bedtime.   Yes [provider]  atorvastatin (LIPITOR) 80 MG tablet Take 1 tablet (80 mg total) by mouth daily. 07/01/22  Yes Alver Sorrow, NP  carvedilol (COREG) 25 MG tablet Take 1 tablet (25 mg total) by mouth 2 (two) times daily. 05/24/22 05/19/23 Yes Alver Sorrow, NP  diphenhydramine-acetaminophen (TYLENOL PM) 25-500 MG TABS tablet Take 1 tablet by mouth at bedtime.   Yes [provider]  hydrALAZINE (APRESOLINE) 100 MG tablet Take 1 tablet (100 mg total) by mouth 2 (two) times daily. 07/01/22  Yes Alver Sorrow, NP  hydrochlorothiazide (HYDRODIURIL) 25 MG tablet Take 1 tablet (25 mg total) by mouth daily. Take 1 tablet by mouth daily. MUST CALL OFFICE FOR APPOINTMENT FOR REFILLS 12/27/22 06/25/23 Yes Alver Sorrow, NP  isosorbide mononitrate (IMDUR) 30 MG 24 hr tablet Take 1 tablet (30 mg total) by mouth at bedtime. 08/12/22 08/07/23 Yes Alver Sorrow, NP  metFORMIN (GLUCOPHAGE-XR) 750 MG 24 hr tablet Take 750 mg by mouth every evening. 08/19/21  Yes [provider]  nitroGLYCERIN (NITROSTAT) 0.4 MG SL tablet Place 1 tablet (0.4 mg total) under the tongue every 5 (five) minutes as needed. 01/19/21  Yes Arty Baumgartner, NP   olmesartan (BENICAR) 40 MG tablet Take 1 tablet (40 mg total) by mouth daily. 07/01/22  Yes Alver Sorrow, NP  venlafaxine XR (EFFEXOR-XR) 150 MG 24 hr capsule Take 150 mg by mouth daily.   Yes [provider]  amLODipine (NORVASC) 10 MG tablet Take 1 tablet (10 mg total) by mouth daily. 07/01/22 12/28/22  Alver Sorrow, NP      VITAL SIGNS:  Blood pressure (!) 112/53, pulse (!) 109, temperature 99 F (37.2 C), temperature source Oral, resp. rate 17, height 5' (1.524 m), weight 58.1 kg, SpO2 90%.  PHYSICAL EXAMINATION:  Physical Exam  GENERAL:  82 y.o.-year-old Caucasian female patient lying in the bed with no acute distress.  EYES: Pupils equal, round, reactive to light and accommodation. No scleral icterus. Extraocular muscles intact.  HEENT: Head atraumatic, normocephalic. Oropharynx and nasopharynx clear.  NECK:  Supple, no jugular venous distention. No thyroid enlargement, no tenderness.  LUNGS: Normal breath sounds bilaterally, no wheezing, rales,rhonchi or crepitation. No use of accessory muscles of respiration.  CARDIOVASCULAR: Regular rate and rhythm, S1, S2 normal. No murmurs, rubs, or  gallops.  ABDOMEN: Soft, nondistended, nontender. Bowel sounds present. No organomegaly or mass.  EXTREMITIES: No pedal edema, cyanosis, or clubbing.  NEUROLOGIC: Cranial nerves II through XII are intact. Muscle strength 5/5 in all extremities. Sensation intact. Gait not checked.  PSYCHIATRIC: The patient is alert and oriented x 3.  Normal affect and good eye contact. SKIN: No obvious rash, lesion, or ulcer.   LABORATORY PANEL:   CBC Recent Labs  Lab 03/04/23 2335  WBC 18.4*  HGB 10.2*  HCT 31.5*  PLT 193   ------------------------------------------------------------------------------------------------------------------  Chemistries  Recent Labs  Lab 03/04/23 2335  NA 130*  K 3.9  CL 99  CO2 19*  GLUCOSE 181*  BUN 40*  CREATININE 1.99*  CALCIUM 7.8*  AST 32   ALT 21  ALKPHOS 105  BILITOT 1.0   ------------------------------------------------------------------------------------------------------------------  Cardiac Enzymes No results for input(s): "TROPONINI" in the last 168 hours. ------------------------------------------------------------------------------------------------------------------  RADIOLOGY:  CT Renal Stone Study  Result Date: 03/05/2023 CLINICAL DATA:  Urosepsis, left CVA tenderness EXAM: CT ABDOMEN AND PELVIS WITHOUT CONTRAST TECHNIQUE: Multidetector CT imaging of the abdomen and pelvis was performed following the standard protocol without IV contrast. RADIATION DOSE REDUCTION: This exam was performed according to the departmental dose-optimization program which includes automated exposure control, adjustment of the mA and/or kV according to patient size and/or use of iterative reconstruction technique. COMPARISON:  04/22/2017 FINDINGS: Lower chest: Mild left basilar atelectasis. Hepatobiliary: Unenhanced liver is unremarkable. Gallbladder is unremarkable. No intrahepatic or extrahepatic dilatation but Pancreas: Within normal limits. Spleen: Within normal limits but Adrenals/Urinary Tract: Stable 13 mm right adrenal nodule, measuring 31 HUs and therefore technically indeterminate, but unchanged from 2018. Given stability, this is compatible with a benign adrenal adenoma, and no follow-up is recommended. Left adrenal gland is within normal limits. 7 mm hyperdense/hemorrhagic right lower pole renal lesion (series 2/image 31), too small to characterize but likely reflecting a benign hemorrhagic cyst. No follow-up is recommended. Similar 5 mm probable hemorrhagic cyst on the left (series 2/image 29), although poorly visualized. Nonspecific perinephric stranding along the left kidney (series 2/image 29). Punctate nonobstructing right lower pole renal calculus (series 2/image 33). No ureteral or bladder calculi. No hydronephrosis. Bladder is  within normal limits. Stomach/Bowel: Stomach is within normal limits. No evidence of bowel obstruction. Normal appendix (series 2/image 50). Status post left hemicolectomy with suture line in the lower pelvis (series 2/image 61). Vascular/Lymphatic: No evidence of abdominal aortic aneurysm. Atherosclerotic calcifications of the abdominal aorta and branch vessels. No suspicious abdominopelvic lymphadenopathy. Reproductive: Uterus is within normal limits. No adnexal masses. Other: No abdominopelvic ascites. Musculoskeletal: Degenerative changes of the visualized thoracolumbar spine. IMPRESSION: Nonspecific perinephric stranding along the left kidney, which could reflect pyelonephritis in the appropriate clinical setting. Correlate with urinalysis. Punctate nonobstructing right lower pole renal calculus. No ureteral or bladder calculi. No hydronephrosis. Electronically Signed   By: Charline Bills M.D.   On: 03/05/2023 00:50   DG Chest Port 1 View  Result Date: 03/05/2023 CLINICAL DATA:  Chills EXAM: PORTABLE CHEST 1 VIEW COMPARISON:  08/29/2021 FINDINGS: Lungs are clear.  No pleural effusion or pneumothorax. The heart is normal in size.  Mild thoracic aortic atherosclerosis. IMPRESSION: No acute cardiopulmonary disease. Electronically Signed   By: Charline Bills M.D.   On: 03/05/2023 00:35      IMPRESSION AND PLAN:  Assessment and Plan: * Sepsis due to gram-negative UTI [facility]) - The patient be admitted to a medical telemetry bed. - Sepsis manifested by leukocytosis, tachycardia and tachypnea. -  This is secondary to acute left pyelonephritis. - Will continue antibiotic therapy with IV Rocephin. - We will follow urine and blood cultures. - We will continue hydration with IV lactated ringer.  AKI (acute kidney injury) (HCC) - The likely prerenal due to volume depletion and dehydration. - Will hydrate with IV lactated ringer and follow BMPs. - We will avoid nephrotoxins.  Essential  hypertension - Continue her antihypertensive therapy while holding off HCTZ and Benicar given her AKI.  Dyslipidemia - We will continue statin therapy.  Depression - We will continue her Effexor XR.  Type 2 diabetes mellitus without complications (HCC) - We will place the patient on supplemental coverage with NovoLog. - We will hold off metformin given her acute kidney injury.   DVT prophylaxis: Lovenox.  Advanced Care Planning:  Code Status: full code.  Family Communication:  The plan of care was discussed in details with the patient (and family). I answered all questions. The patient agreed to proceed with the above mentioned plan. Further management will depend upon hospital course. Disposition Plan: Back to previous home environment Consults called: none.  All the records are reviewed and case discussed with ED provider.  Status is: Inpatient    At the time of the admission, it appears that the appropriate admission status for this patient is inpatient.  This is judged to be reasonable and necessary in order to provide the required intensity of service to ensure the patient's safety given the presenting symptoms, physical exam findings and initial radiographic and laboratory data in the context of comorbid conditions.  The patient requires inpatient status due to high intensity of service, high risk of further deterioration and high frequency of surveillance required.  I certify that at the time of admission, it is my clinical judgment that the patient will require inpatient hospital care extending more than 2 midnights.                            Dispo: The patient is from: Home              Anticipated d/c is to: Home              Patient currently is not medically stable to d/c.              Difficult to place patient: No  Hannah Beat M.D on 03/05/2023 at 5:19 AM  [facility]   From 7 PM-7 AM, contact night-coverage [email or web address]  CC: Primary care physician;  [place]aker, [employer], PA-C

## 2023-03-05 NOTE — Assessment & Plan Note (Addendum)
-   The patient be admitted to a medical telemetry bed. - Sepsis manifested by leukocytosis, tachycardia and tachypnea. - This is secondary to acute left pyelonephritis. - Will continue antibiotic therapy with IV Rocephin. - We will follow urine and blood cultures. - We will continue hydration with IV lactated ringer.

## 2023-03-05 NOTE — Assessment & Plan Note (Addendum)
-   Continue her antihypertensive therapy while holding off HCTZ and Benicar given her AKI.

## 2023-03-05 NOTE — Progress Notes (Signed)
CODE SEPSIS - PHARMACY COMMUNICATION  **Broad Spectrum Antibiotics should be administered within 1 hour of Sepsis diagnosis**  Time Code Sepsis Called/Page Received: 8/14 @ 0117   Antibiotics Ordered: Ceftriaxone   Time of 1st antibiotic administration: Ceftriaxone 1 gm IV X 1 on 8/14 @ 0021   Additional action taken by pharmacy:   If necessary, Name of Provider/Nurse Contacted:     , D ,PharmD Clinical Pharmacist  03/05/2023  1:43 AM

## 2023-03-05 NOTE — Assessment & Plan Note (Signed)
-   The likely prerenal due to volume depletion and dehydration. - Will hydrate with IV lactated ringer and follow BMPs. - We will avoid nephrotoxins.

## 2023-03-05 NOTE — Assessment & Plan Note (Signed)
-   We will continue statin therapy. 

## 2023-03-05 NOTE — Assessment & Plan Note (Signed)
-   We will place the patient on supplemental coverage with NovoLog. - We will hold off metformin given her acute kidney injury.

## 2023-03-06 DIAGNOSIS — A4150 Gram-negative sepsis, unspecified: Secondary | ICD-10-CM | POA: Diagnosis not present

## 2023-03-06 DIAGNOSIS — N390 Urinary tract infection, site not specified: Secondary | ICD-10-CM | POA: Diagnosis not present

## 2023-03-06 LAB — CBC
HCT: 25.2 % — ABNORMAL LOW (ref 36.0–46.0)
Hemoglobin: 8.4 g/dL — ABNORMAL LOW (ref 12.0–15.0)
MCH: 33.5 pg (ref 26.0–34.0)
MCHC: 33.3 g/dL (ref 30.0–36.0)
MCV: 100.4 fL — ABNORMAL HIGH (ref 80.0–100.0)
Platelets: 140 10*3/uL — ABNORMAL LOW (ref 150–400)
RBC: 2.51 MIL/uL — ABNORMAL LOW (ref 3.87–5.11)
RDW: 14.1 % (ref 11.5–15.5)
WBC: 14.9 10*3/uL — ABNORMAL HIGH (ref 4.0–10.5)
nRBC: 0 % (ref 0.0–0.2)

## 2023-03-06 LAB — URINE CULTURE: Culture: <10000 — AB

## 2023-03-06 LAB — BASIC METABOLIC PANEL
Anion gap: 7 (ref 5–15)
BUN: 24 mg/dL — ABNORMAL HIGH (ref 8–23)
CO2: 21 mmol/L — ABNORMAL LOW (ref 22–32)
Calcium: 7.5 mg/dL — ABNORMAL LOW (ref 8.9–10.3)
Chloride: 113 mmol/L — ABNORMAL HIGH (ref 98–111)
Creatinine, Ser: 1.24 mg/dL — ABNORMAL HIGH (ref 0.44–1.00)
GFR, Estimated: 44 mL/min — ABNORMAL LOW (ref >60)
Glucose, Bld: 123 mg/dL — ABNORMAL HIGH (ref 70–99)
Potassium: 3.3 mmol/L — ABNORMAL LOW (ref 3.5–5.1)
Sodium: 141 mmol/L (ref 135–145)

## 2023-03-06 MED ORDER — CEFPODOXIME PROXETIL 200 MG PO TABS: 200.0000 mg | ORAL_TABLET | Freq: Two times a day (BID) | ORAL | 0 refills | Status: AC

## 2023-03-06 NOTE — TOC Transition Note (Signed)
Transition of Care [facility]) - CM/SW Discharge Note   Patient Details  Name: Elizabeth Mcmillan MRN: [id number] Date of Birth: 12-04-1940  [facility] CM/SW Contact:  Darleene Cleaver, LCSW Phone Number: 03/06/2023, 12:11 PM   Clinical Narrative:     CSW spoke to patient's daughter French Ana, to offer home health services or any equipment.  Per patient and daughter they do not want HH services and do not feel she needs any equipment.  CSW signing off, daughter will be taking patient back home today.   Final next level of care: Home/Self Care Barriers to Discharge: Barriers Resolved   Patient Goals and CMS Choice CMS Medicare.gov Compare Post Acute Care list provided to:: Patient Represenative (must comment) Choice offered to / list presented to : Adult Children  Discharge Placement                    Name of family member notified: Daughter French Ana  [phone] Patient and family notified of of transfer: 03/06/23  Discharge Plan and Services Additional resources added to the After Visit Summary for                                       Social Determinants of Health (SDOH) Interventions SDOH Screenings   Food Insecurity: No Food Insecurity (03/05/2023)  Housing: Low Risk  (03/05/2023)  Transportation Needs: No Transportation Needs (03/05/2023)  Utilities: Not At Risk (03/05/2023)  Tobacco Use: Low Risk  (03/05/2023)     Readmission Risk Interventions     No data to display

## 2023-03-06 NOTE — Discharge Instructions (Addendum)
Hold your hydrochlorothiazide and olmesartan until you follow up with your primary care provider

## 2023-03-06 NOTE — Discharge Summary (Signed)
ZONG MOGEL ZOX:[id number] DOB: 1941-02-03 DOA: 03/04/2023  PCP: Wilford Corner, PA-C  Admit date: 03/04/2023 Discharge date: 03/06/2023  Time spent: 35 minutes  Recommendations for Outpatient Follow-up:  Pcp f/u     Discharge Diagnoses:  Principal Problem:   Sepsis due to gram-negative UTI [facility]) Active Problems:   AKI (acute kidney injury) (HCC)   Essential hypertension   Dyslipidemia   CAD (coronary artery disease)   Type 2 diabetes mellitus without complications (HCC)   Depression   Pyelonephritis   Discharge Condition: stable  Diet recommendation: heart healthy  Filed Weights   03/04/23 2323  Weight: 58.1 kg    History of present illness:  From admission h and p Elizabeth Mcmillan is a 82 y.o. Caucasian female with medical history significant for anxiety, cardiac disease, type diabetes mellitus, GERD, dyslipidemia, hypertension and coronary artery disease s/p NSTEMI, who presented to the ER with acute onset of left-sided flank pain as well as associated dysuria, urinary frequency and urgency.  She denies any nausea or vomiting.  No chest pain or dyspnea or cough or wheezing.  She has been having chills but denies any measured fever.   Hospital Course:  Patient presents with left flank pain and dysuria. Also found to have mild fever and mild AKI. CT with signs of left-sided pyelonephritis, no signs complication. Here treated with IV fluids and ceftriaxone. Clinically she improved and feels close to her baseline. PT evaluated and advises home health PT which the patient declines. Blood cultures no growth for 2 days. Urine culture unfortunately insignificant growth. Given response to ceftriaxone will rx cefpodoxime at discharge to complete a 10 day course. BP low normal on arrival, improved to mildly elevated on day of discharge with holding home bp meds, advise holding home hydrochlorothiazide and olmesartan until pcp f/u.  Procedures: none    Consultations: none  Discharge Exam: Vitals:   03/05/23 2153 03/06/23 0736  BP: (!) 141/62 (!) 145/62  Pulse: 92 (!) 105  Resp: 16 15  Temp: 98 F (36.7 C) 100.2 F (37.9 C)  SpO2: 91% 90%    General: NAD Cardiovascular: RRR Respiratory: CTAB  Discharge Instructions   Discharge Instructions     Diet - low sodium heart healthy   Complete by: As directed    Increase activity slowly   Complete by: As directed       Allergies as of 03/06/2023   No Known Allergies      Medication List     TAKE these medications    acetaminophen 500 MG tablet Commonly known as: TYLENOL Take 1,000 mg by mouth every 6 (six) hours as needed (pain).   amLODipine 10 MG tablet Commonly known as: NORVASC Take 1 tablet by mouth daily.   amLODipine 10 MG tablet Commonly known as: NORVASC Take 1 tablet (10 mg total) by mouth daily.   aspirin EC 81 MG tablet Take 81 mg by mouth at bedtime.   atorvastatin 80 MG tablet Commonly known as: LIPITOR Take 1 tablet (80 mg total) by mouth daily.   carvedilol 25 MG tablet Commonly known as: COREG Take 1 tablet (25 mg total) by mouth 2 (two) times daily.   cefpodoxime 200 MG tablet Commonly known as: VANTIN Take 1 tablet (200 mg total) by mouth 2 (two) times daily for 8 days.   diphenhydramine-acetaminophen 25-500 MG Tabs tablet Commonly known as: TYLENOL PM Take 1 tablet by mouth at bedtime.   hydrALAZINE 100 MG tablet Commonly known as: APRESOLINE Take  1 tablet (100 mg total) by mouth 2 (two) times daily.   hydrochlorothiazide 25 MG tablet Commonly known as: HYDRODIURIL Take 1 tablet (25 mg total) by mouth daily. Take 1 tablet by mouth daily. MUST CALL OFFICE FOR APPOINTMENT FOR REFILLS   isosorbide mononitrate 30 MG 24 hr tablet Commonly known as: IMDUR Take 1 tablet (30 mg total) by mouth at bedtime.   ketorolac 0.5 % ophthalmic solution Commonly known as: ACULAR Place 1 drop into the right eye 4 (four) times daily.    metFORMIN 750 MG 24 hr tablet Commonly known as: GLUCOPHAGE-XR Take 750 mg by mouth every evening.   nitroGLYCERIN 0.4 MG SL tablet Commonly known as: Nitrostat Place 1 tablet (0.4 mg total) under the tongue every 5 (five) minutes as needed.   olmesartan 40 MG tablet Commonly known as: BENICAR Take 1 tablet (40 mg total) by mouth daily.   prednisoLONE acetate 1 % ophthalmic suspension Commonly known as: PRED FORTE Place 1 drop into the right eye 4 (four) times daily.   venlafaxine XR 150 MG 24 hr capsule Commonly known as: EFFEXOR-XR Take 150 mg by mouth daily.       No Known Allergies  Follow-up Information     Debbra Riding Hestle, PA-C Follow up.   Specialty: Family Medicine Contact information: [address] [place] [address] [phone]                  The results of significant diagnostics from this hospitalization (including imaging, microbiology, ancillary and laboratory) are listed below for reference.    Significant Diagnostic Studies: CT Renal Stone Study  Result Date: 03/05/2023 CLINICAL DATA:  Urosepsis, left CVA tenderness EXAM: CT ABDOMEN AND PELVIS WITHOUT CONTRAST TECHNIQUE: Multidetector CT imaging of the abdomen and pelvis was performed following the standard protocol without IV contrast. RADIATION DOSE REDUCTION: This exam was performed according to the departmental dose-optimization program which includes automated exposure control, adjustment of the mA and/or kV according to patient size and/or use of iterative reconstruction technique. COMPARISON:  04/22/2017 FINDINGS: Lower chest: Mild left basilar atelectasis. Hepatobiliary: Unenhanced liver is unremarkable. Gallbladder is unremarkable. No intrahepatic or extrahepatic dilatation but Pancreas: Within normal limits. Spleen: Within normal limits but Adrenals/Urinary Tract: Stable 13 mm right adrenal nodule, measuring 31 HUs and therefore technically indeterminate, but unchanged  from 2018. Given stability, this is compatible with a benign adrenal adenoma, and no follow-up is recommended. Left adrenal gland is within normal limits. 7 mm hyperdense/hemorrhagic right lower pole renal lesion (series 2/image 31), too small to characterize but likely reflecting a benign hemorrhagic cyst. No follow-up is recommended. Similar 5 mm probable hemorrhagic cyst on the left (series 2/image 29), although poorly visualized. Nonspecific perinephric stranding along the left kidney (series 2/image 29). Punctate nonobstructing right lower pole renal calculus (series 2/image 33). No ureteral or bladder calculi. No hydronephrosis. Bladder is within normal limits. Stomach/Bowel: Stomach is within normal limits. No evidence of bowel obstruction. Normal appendix (series 2/image 50). Status post left hemicolectomy with suture line in the lower pelvis (series 2/image 61). Vascular/Lymphatic: No evidence of abdominal aortic aneurysm. Atherosclerotic calcifications of the abdominal aorta and branch vessels. No suspicious abdominopelvic lymphadenopathy. Reproductive: Uterus is within normal limits. No adnexal masses. Other: No abdominopelvic ascites. Musculoskeletal: Degenerative changes of the visualized thoracolumbar spine. IMPRESSION: Nonspecific perinephric stranding along the left kidney, which could reflect pyelonephritis in the appropriate clinical setting. Correlate with urinalysis. Punctate nonobstructing right lower pole renal calculus. No ureteral or bladder calculi.  No hydronephrosis. Electronically Signed   By: Charline Bills M.D.   On: 03/05/2023 00:50   DG Chest Port 1 View  Result Date: 03/05/2023 CLINICAL DATA:  Chills EXAM: PORTABLE CHEST 1 VIEW COMPARISON:  08/29/2021 FINDINGS: Lungs are clear.  No pleural effusion or pneumothorax. The heart is normal in size.  Mild thoracic aortic atherosclerosis. IMPRESSION: No acute cardiopulmonary disease. Electronically Signed   By: Charline Bills M.D.    On: 03/05/2023 00:35    Microbiology: Recent Results (from the past 240 hour(s))  Culture, blood (Routine x 2)     Status: None (Preliminary result)   Collection Time: 03/04/23 11:34 PM   Specimen: BLOOD  Result Value Ref Range Status   Specimen Description BLOOD BLOOD RIGHT HAND  Final   Special Requests   Final    BOTTLES DRAWN AEROBIC AND ANAEROBIC Blood Culture results may not be optimal due to an inadequate volume of blood received in culture bottles   Culture   Final    NO GROWTH 2 DAYS Performed at [facility], [address] [place], [place] [address]    Report Status PENDING  Incomplete  Culture, blood (Routine x 2)     Status: None (Preliminary result)   Collection Time: 03/04/23 11:49 PM   Specimen: BLOOD  Result Value Ref Range Status   Specimen Description BLOOD  RAC  Final   Special Requests   Final    BOTTLES DRAWN AEROBIC AND ANAEROBIC Blood Culture adequate volume   Culture   Final    NO GROWTH 2 DAYS Performed at [facility], [address], [place], [place] [address]    Report Status PENDING  Incomplete  SARS Coronavirus 2 by RT PCR (hospital order, performed in [facility] hospital lab) *cepheid single result test* Anterior Nasal Swab     Status: None   Collection Time: 03/04/23 11:49 PM   Specimen: Anterior Nasal Swab  Result Value Ref Range Status   SARS Coronavirus 2 by RT PCR NEGATIVE NEGATIVE Final    Comment: (NOTE) SARS-CoV-2 target nucleic acids are NOT DETECTED.  The SARS-CoV-2 RNA is generally detectable in upper and lower respiratory specimens during the acute phase of infection. The lowest concentration of SARS-CoV-2 viral copies this assay can detect is 250 copies / mL. A negative result does not preclude SARS-CoV-2 infection and should not be used as the sole basis for treatment or other patient management decisions.  A negative result may occur with improper specimen collection / handling, submission of specimen  other than nasopharyngeal swab, presence of viral mutation(s) within the areas targeted by this assay, and inadequate number of viral copies (<250 copies / mL). A negative result must be combined with clinical observations, patient history, and epidemiological information.  Fact Sheet for Patients:   RoadLapTop.co.za  Fact Sheet for Healthcare Providers: http://kim-miller.com/  This test is not yet approved or  cleared by the [place] FDA and has been authorized for detection and/or diagnosis of SARS-CoV-2 by FDA under an Emergency Use Authorization (EUA).  This EUA will remain in effect (meaning this test can be used) for the duration of the COVID-19 declaration under Section 564(b)(1) of the Act, 21 U.S.C. section 360bbb-3(b)(1), unless the authorization is terminated or revoked sooner.  Performed at [facility], [address], [place], [place] [address]   Urine Culture     Status: Abnormal   Collection Time: 03/04/23 11:49 PM   Specimen: Urine, Clean Catch  Result Value Ref  Range Status   Specimen Description   Final    URINE, CLEAN CATCH Performed at [facility], [address], [place], [place] [address]    Special Requests   Final    NONE Performed at [facility], [address] [place], [place] [address]    Culture (A)  Final    <10,000 COLONIES/mL INSIGNIFICANT GROWTH Performed at [facility] [place] [facility], [address] [place], [place] [address]    Report Status 03/06/2023 FINAL  Final     Labs: Basic Metabolic Panel: Recent Labs  Lab 03/04/23 2335 03/05/23 0522 03/06/23 0318  NA 130* 135 141  K 3.9 3.6 3.3*  CL 99 106 113*  CO2 19* 23 21*  GLUCOSE 181* 196* 123*  BUN 40* 33* 24*  CREATININE 1.99* 1.56* 1.24*  CALCIUM 7.8* 7.3* 7.5*   Liver Function Tests: Recent Labs  Lab 03/04/23 2335  AST 32  ALT 21  ALKPHOS 105  BILITOT 1.0  PROT 6.5  ALBUMIN 3.6   No  results for input(s): "LIPASE", "AMYLASE" in the last 168 hours. No results for input(s): "AMMONIA" in the last 168 hours. CBC: Recent Labs  Lab 03/04/23 2335 03/05/23 0522 03/06/23 0318  WBC 18.4* 24.6* 14.9*  NEUTROABS 17.3*  --   --   HGB 10.2* 8.8* 8.4*  HCT 31.5* 26.4* 25.2*  MCV 102.9* 101.1* 100.4*  PLT 193 161 140*   Cardiac Enzymes: No results for input(s): "CKTOTAL", "CKMB", "CKMBINDEX", "TROPONINI" in the last 168 hours. BNP: BNP (last 3 results) No results for input(s): "BNP" in the last 8760 hours.  ProBNP (last 3 results) No results for input(s): "PROBNP" in the last 8760 hours.  CBG: No results for input(s): "GLUCAP" in the last 168 hours.     Signed:  Silvano Bilis MD.  [facility] 03/06/2023, 10:24 AM

## 2023-03-06 NOTE — Evaluation (Signed)
Physical Therapy Evaluation Patient Details Name: Elizabeth Mcmillan MRN: [id number] DOB: 03/03/1941 Today's Date: 03/06/2023  History of Present Illness  Pt is a 82 y.o. Caucasian female with medical history significant for anxiety, cardiac disease, type diabetes mellitus, GERD, dyslipidemia, hypertension and coronary artery disease s/p NSTEMI, who presented to the ER with acute onset of left-sided flank pain as well as associated dysuria, urinary frequency and urgency.  She denies any nausea or vomiting.  No chest pain or dyspnea or cough or wheezing.  Clinical Impression   Pt is received in bed, she is agreeable to PT session. Pt reports amb without AD and having assistance at home from children who live nearby. Pt performs bed mobility and transfers with supervision, and amb CGA for safety. Pt able to amb 200 ft without AD and no reports of dizziness/SOB. Pt demonstrates generalized BLE weakness and mild unsteadiness which she reports is different than baseline. Pt would benefit from skilled PT to address above deficits and promote optimal return to PLOF.         If plan is discharge home, recommend the following: A little help with walking and/or transfers;A little help with bathing/dressing/bathroom;Assistance with cooking/housework;Assist for transportation   Can travel by private vehicle        Equipment Recommendations None recommended by PT  Recommendations for Other Services       Functional Status Assessment Patient has had a recent decline in their functional status and demonstrates the ability to make significant improvements in function in a reasonable and predictable amount of time.     Precautions / Restrictions Precautions Precautions: Fall Restrictions Weight Bearing Restrictions: No      Mobility  Bed Mobility Overal bed mobility: Needs Assistance Bed Mobility: Supine to Sit     Supine to sit: Supervision     General bed mobility comments: Able to perform  bed mobility independently with supervision for safety    Transfers Overall transfer level: Needs assistance Equipment used: None Transfers: Sit to/from Stand Sit to Stand: Supervision           General transfer comment: Able to perform STS without need of BUE support with supervision for safety    Ambulation/Gait Ambulation/Gait assistance: Contact guard assist Gait Distance (Feet): 200 Feet Assistive device: None Gait Pattern/deviations: Step-through pattern, Decreased step length - right, Decreased step length - left, Decreased stride length Gait velocity: Decreased     General Gait Details: Decreased reciprocal arm swing noted and mild unsteadiness but able to correct  Stairs            Wheelchair Mobility     Tilt Bed    Modified Rankin (Stroke Patients Only)       Balance Overall balance assessment: Needs assistance Sitting-balance support: Feet supported Sitting balance-Leahy Scale: Good Sitting balance - Comments: Able to maintain seated balance at EOB during functional activities   Standing balance support: No upper extremity supported, During functional activity Standing balance-Leahy Scale: Fair Standing balance comment: Able to maintain static standing balance without BUE support with occasional swaying                             Pertinent Vitals/Pain Pain Assessment Pain Assessment: No/denies pain    Home Living Family/patient expects to be discharged to:: Private residence Living Arrangements: Alone Available Help at Discharge: Family;Available PRN/intermittently Type of Home: House Home Access: Level entry       Home Layout: One  level;Other (Comment) (1 step to get into kitchen with a grab bar) Home Equipment: Rolling Walker (2 wheels);Cane - single point Additional Comments: Children will be splitting their time to check in on her    Prior Function Prior Level of Function : Independent/Modified Independent              Mobility Comments: Does not currently use AD to walk, but has them in her home just in case. Pt drives and does her own grocery shopping       Extremity/Trunk Assessment   Upper Extremity Assessment Upper Extremity Assessment: Overall WFL for tasks assessed    Lower Extremity Assessment Lower Extremity Assessment: Generalized weakness       Communication   Communication Communication: No apparent difficulties  Cognition Arousal: Alert Behavior During Therapy: WFL for tasks assessed/performed Overall Cognitive Status: Within Functional Limits for tasks assessed                                 General Comments: Pleasant and willing to work with PT        General Comments      Exercises     Assessment/Plan    PT Assessment Patient needs continued PT services  PT Problem List Decreased strength;Decreased mobility;Decreased activity tolerance;Decreased balance       PT Treatment Interventions Gait training;Functional mobility training;Therapeutic activities;Balance training;Therapeutic exercise    PT Goals (Current goals can be found in the Care Plan section)  Acute Rehab PT Goals Patient Stated Goal: to go home PT Goal Formulation: With patient Time For Goal Achievement: 03/20/23 Potential to Achieve Goals: Good    Frequency Min 1X/week     Co-evaluation               AM-PAC PT "6 Clicks" Mobility  Outcome Measure Help needed turning from your back to your side while in a flat bed without using bedrails?: None Help needed moving from lying on your back to sitting on the side of a flat bed without using bedrails?: None Help needed moving to and from a bed to a chair (including a wheelchair)?: A Little Help needed standing up from a chair using your arms (e.g., wheelchair or bedside chair)?: None Help needed to walk in hospital room?: A Little Help needed climbing 3-5 steps with a railing? : A Little 6 Click Score: 21    End of  Session Equipment Utilized During Treatment: Gait belt Activity Tolerance: Patient tolerated treatment well Patient left: in chair;with call bell/phone within reach;with chair alarm set Nurse Communication: Mobility status PT Visit Diagnosis: Unsteadiness on feet (R26.81);Muscle weakness (generalized) (M62.81)    Time: 5284-1324 PT Time Calculation (min) (ACUTE ONLY): 15 min   Charges:   PT Evaluation $PT Eval Low Complexity: 1 Low   PT General Charges $$ ACUTE PT VISIT: 1 Visit         Elmon Else, SPT     03/06/2023, 12:58 PM

## 2023-03-06 NOTE — Progress Notes (Signed)
Pt has DC order. MD was informed of the temp of 100.1, 100.2 this AM. MD was ok to DC pt. Pt and daughter was at bedside, AVS was given and explained.  03/06/23 1102  Vitals  Temp 100.1 F (37.8 C)  Temp Source Oral  BP 132/65  MAP (mmHg) 83  BP Location Right Arm  BP Method Automatic  Patient Position (if appropriate) Lying  Pulse Rate 88  Pulse Rate Source Dinamap  Resp 18  MEWS COLOR  MEWS Score Color Green  Oxygen Therapy  SpO2 90 %  O2 Device Room Air  MEWS Score  MEWS Temp 0  MEWS Systolic 0  MEWS Pulse 0  MEWS RR 0  MEWS LOC 0  MEWS Score 0

## 2023-03-28 ENCOUNTER — Other Ambulatory Visit: Payer: Self-pay | Admitting: Cardiovascular Disease

## 2023-03-28 ENCOUNTER — Other Ambulatory Visit (HOSPITAL_BASED_OUTPATIENT_CLINIC_OR_DEPARTMENT_OTHER): Payer: Self-pay | Admitting: Family

## 2023-03-28 DIAGNOSIS — I10 Essential (primary) hypertension: Secondary | ICD-10-CM

## 2023-03-31 ENCOUNTER — Encounter: Payer: Self-pay | Admitting: Gastroenterology

## 2023-03-31 ENCOUNTER — Ambulatory Visit
Admission: RE | Admit: 2023-03-31 | Discharge: 2023-03-31 | Disposition: A | Discharge status: Home / Self Care | Payer: Medicare Other | Attending: Gastroenterology | Admitting: Gastroenterology

## 2023-03-31 ENCOUNTER — Encounter
Admission: RE | Disposition: A | Discharge status: Home / Self Care | Payer: Self-pay | Source: Home / Self Care | Attending: Gastroenterology

## 2023-03-31 ENCOUNTER — Ambulatory Visit: Payer: Medicare Other | Admitting: Anesthesiology

## 2023-03-31 DIAGNOSIS — Z09 Encounter for follow-up examination after completed treatment for conditions other than malignant neoplasm: Secondary | ICD-10-CM | POA: Insufficient documentation

## 2023-03-31 DIAGNOSIS — I2510 Atherosclerotic heart disease of native coronary artery without angina pectoris: Secondary | ICD-10-CM | POA: Diagnosis not present

## 2023-03-31 DIAGNOSIS — K640 First degree hemorrhoids: Secondary | ICD-10-CM | POA: Insufficient documentation

## 2023-03-31 DIAGNOSIS — D122 Benign neoplasm of ascending colon: Secondary | ICD-10-CM | POA: Diagnosis not present

## 2023-03-31 DIAGNOSIS — I252 Old myocardial infarction: Secondary | ICD-10-CM | POA: Insufficient documentation

## 2023-03-31 DIAGNOSIS — D123 Benign neoplasm of transverse colon: Secondary | ICD-10-CM | POA: Diagnosis not present

## 2023-03-31 DIAGNOSIS — F419 Anxiety disorder, unspecified: Secondary | ICD-10-CM | POA: Diagnosis not present

## 2023-03-31 DIAGNOSIS — E7800 Pure hypercholesterolemia, unspecified: Secondary | ICD-10-CM | POA: Diagnosis not present

## 2023-03-31 DIAGNOSIS — Z1211 Encounter for screening for malignant neoplasm of colon: Secondary | ICD-10-CM | POA: Insufficient documentation

## 2023-03-31 DIAGNOSIS — Z86010 Personal history of colonic polyps: Secondary | ICD-10-CM | POA: Diagnosis not present

## 2023-03-31 DIAGNOSIS — Z980 Intestinal bypass and anastomosis status: Secondary | ICD-10-CM | POA: Insufficient documentation

## 2023-03-31 DIAGNOSIS — Z79899 Other long term (current) drug therapy: Secondary | ICD-10-CM | POA: Insufficient documentation

## 2023-03-31 DIAGNOSIS — I509 Heart failure, unspecified: Secondary | ICD-10-CM | POA: Insufficient documentation

## 2023-03-31 DIAGNOSIS — K5730 Diverticulosis of large intestine without perforation or abscess without bleeding: Secondary | ICD-10-CM | POA: Diagnosis not present

## 2023-03-31 DIAGNOSIS — I110 Hypertensive heart disease with heart failure: Secondary | ICD-10-CM | POA: Insufficient documentation

## 2023-03-31 DIAGNOSIS — Z7984 Long term (current) use of oral hypoglycemic drugs: Secondary | ICD-10-CM | POA: Insufficient documentation

## 2023-03-31 DIAGNOSIS — Z955 Presence of coronary angioplasty implant and graft: Secondary | ICD-10-CM | POA: Diagnosis not present

## 2023-03-31 DIAGNOSIS — E119 Type 2 diabetes mellitus without complications: Secondary | ICD-10-CM | POA: Diagnosis not present

## 2023-03-31 DIAGNOSIS — K219 Gastro-esophageal reflux disease without esophagitis: Secondary | ICD-10-CM | POA: Insufficient documentation

## 2023-03-31 DIAGNOSIS — D128 Benign neoplasm of rectum: Secondary | ICD-10-CM | POA: Insufficient documentation

## 2023-03-31 HISTORY — PX: POLYPECTOMY: SHX5525

## 2023-03-31 HISTORY — PX: COLONOSCOPY WITH PROPOFOL: SHX5780

## 2023-03-31 LAB — GLUCOSE, CAPILLARY: Glucose-Capillary: 137 mg/dL — ABNORMAL HIGH (ref 70–99)

## 2023-03-31 SURGERY — COLONOSCOPY WITH PROPOFOL
Anesthesia: General

## 2023-03-31 MED ORDER — PROPOFOL 500 MG/50ML IV EMUL
INTRAVENOUS | Status: DC | PRN
  Administered 2023-03-31: 50 ug/kg/min via INTRAVENOUS

## 2023-03-31 MED ORDER — SODIUM CHLORIDE 0.9 % IV SOLN
INTRAVENOUS | Status: DC
  Administered 2023-03-31: 20 mL/h via INTRAVENOUS

## 2023-03-31 MED ORDER — LIDOCAINE HCL (CARDIAC) PF 100 MG/5ML IV SOSY
PREFILLED_SYRINGE | INTRAVENOUS | Status: DC | PRN
  Administered 2023-03-31: 50 mg via INTRAVENOUS

## 2023-03-31 MED ORDER — PROPOFOL 10 MG/ML IV BOLUS
INTRAVENOUS | Status: DC | PRN
  Administered 2023-03-31: 50 mg via INTRAVENOUS

## 2023-03-31 MED ORDER — PROPOFOL 1000 MG/100ML IV EMUL
INTRAVENOUS | Status: AC
  Filled 2023-03-31: qty 100

## 2023-03-31 MED ORDER — LIDOCAINE HCL (PF) 2 % IJ SOLN
INTRAMUSCULAR | Status: AC
  Filled 2023-03-31: qty 5

## 2023-03-31 NOTE — Group Note (Deleted)

## 2023-03-31 NOTE — H&P (Signed)
Pre-Procedure H&P   Patient ID: Elizabeth Mcmillan is a 82 y.o. female.  Gastroenterology Provider: Jaynie Collins, DO  Referring Provider: Jacob Moores, PA PCP: Wilford Corner, PA-C  Date: 03/31/2023  HPI Ms. Elizabeth Mcmillan is a 82 y.o. female who presents today for Colonoscopy for surveillance-personal history of colon polyps.  Patient last underwent colonoscopy in February 2018 demonstrating sigmoid diverticulosis, internal hemorrhoids and 2 adenomatous polyps.  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  She underwent LAR in January 2015.  Additional TA and TVA removed measuring 7 and 4 mm respectively  Patient reports regular bowel movements without melena or hematochezia. Current 1.2 hemoglobin 10.8 MCV 101.5 platelets 428,000  Had NSTEMI in June 2022 status post PCI DES x 2.  She is no longer on antiplatelet for anticoagulation  No family history of colon cancer or colon polyps   Past Medical History:  Diagnosis Date   Anxiety    CAD (coronary artery disease) 08/30/2021   Diabetes mellitus without complication (HCC)    GERD (gastroesophageal reflux disease)    Hypercholesteremia    Hypertension    NSTEMI (non-ST elevated myocardial infarction) (HCC) 01/17/2021    Past Surgical History:  Procedure Laterality Date   ABDOMINAL HYSTERECTOMY     BREAST EXCISIONAL BIOPSY Left years ago   negative   COLON SURGERY     COLONOSCOPY WITH PROPOFOL N/A 09/04/2016   Procedure: COLONOSCOPY WITH PROPOFOL;  Surgeon: Scot Jun, MD;  Location: [facility] ENDOSCOPY;  Service: Endoscopy;  Laterality: N/A;   CORONARY STENT INTERVENTION N/A 01/18/2021   Procedure: CORONARY STENT INTERVENTION;  Surgeon: Runell Gess, MD;  Location: [facility] INVASIVE CV LAB;  Service: Cardiovascular;  Laterality: N/A;   CORONARY ULTRASOUND/IVUS N/A 01/18/2021   Procedure: Intravascular Ultrasound/IVUS;  Surgeon: Runell Gess, MD;  Location: [facility] INVASIVE CV LAB;  Service: Cardiovascular;  Laterality: N/A;   LEFT HEART CATH AND CORONARY ANGIOGRAPHY N/A 01/18/2021   Procedure: LEFT HEART CATH AND CORONARY ANGIOGRAPHY;  Surgeon: Runell Gess, MD;  Location: [facility] INVASIVE CV LAB;  Service: Cardiovascular;  Laterality: N/A;   LUMBAR LAMINECTOMY/ DECOMPRESSION WITH MET-RX Right 05/09/2015   Procedure: Far Lateral Lumbar Three-Four Microdiscectomy WITH MET-RX;  Surgeon: Maeola Harman, MD;  Location: [facility] NEURO ORS;  Service: Neurosurgery;  Laterality: Right;    Family History No h/o GI disease or malignancy  Review of Systems  Constitutional:  Negative for activity change, appetite change, chills, diaphoresis, fatigue, fever and unexpected weight change.  HENT:  Negative for trouble swallowing and voice change.   Respiratory:  Negative for shortness of breath and wheezing.   Cardiovascular:  Negative for chest pain, palpitations and leg swelling.  Gastrointestinal:  Negative for abdominal distention, abdominal pain, anal bleeding, blood in stool, constipation, diarrhea, nausea, rectal pain and vomiting.  Musculoskeletal:  Negative for arthralgias and myalgias.  Skin:  Negative for color change and pallor.  Neurological:  Negative for dizziness, syncope and weakness.  Psychiatric/Behavioral:  Negative for confusion.   All other systems reviewed and are negative.    Medications No current facility-administered medications on file prior to encounter.   Current Outpatient Medications on File Prior to Encounter  Medication Sig Dispense Refill   aspirin EC 81 MG tablet Take 81 mg by mouth at bedtime.     atorvastatin (LIPITOR) 80 MG tablet Take 1 tablet (80 mg total) by mouth daily. 90 tablet 3  carvedilol (COREG) 25 MG tablet Take 1 tablet (25 mg total) by mouth 2 (two) times daily. 180 tablet 3   hydrochlorothiazide (HYDRODIURIL) 25 MG tablet Take 1 tablet (25 mg total) by mouth daily. Take 1 tablet by mouth  daily. MUST CALL OFFICE FOR APPOINTMENT FOR REFILLS 30 tablet 0   isosorbide mononitrate (IMDUR) 30 MG 24 hr tablet Take 1 tablet (30 mg total) by mouth at bedtime. 90 tablet 3   acetaminophen (TYLENOL) 500 MG tablet Take 1,000 mg by mouth every 6 (six) hours as needed (pain).     amLODipine (NORVASC) 10 MG tablet Take 1 tablet (10 mg total) by mouth daily. 30 tablet 5   metFORMIN (GLUCOPHAGE-XR) 750 MG 24 hr tablet Take 750 mg by mouth every evening.     nitroGLYCERIN (NITROSTAT) 0.4 MG SL tablet Place 1 tablet (0.4 mg total) under the tongue every 5 (five) minutes as needed. 25 tablet 2   olmesartan (BENICAR) 40 MG tablet Take 1 tablet (40 mg total) by mouth daily. 30 tablet 5   venlafaxine XR (EFFEXOR-XR) 150 MG 24 hr capsule Take 150 mg by mouth daily.      Pertinent medications related to GI and procedure were reviewed by me with the patient prior to the procedure   Current Facility-Administered Medications:    0.9 %  sodium chloride infusion, , Intravenous, Continuous, Jaynie Collins, DO, Last Rate: 20 mL/hr at 03/31/23 1104, 20 mL/hr at 03/31/23 1104  sodium chloride 20 mL/hr (03/31/23 1104)       No Known Allergies Allergies were reviewed by me prior to the procedure  Objective   Body mass index is 22.85 kg/m. Vitals:   03/31/23 1051  BP: (!) 157/86  Pulse: (!) 120  Resp: 20  Temp: (!) 97 F (36.1 C)  TempSrc: Temporal  SpO2: 98%  Weight: 53.1 kg  Height: 5' (1.524 m)    Physical Exam Vitals and nursing note reviewed.  Constitutional:      General: She is not in acute distress.    Appearance: Normal appearance. She is not ill-appearing, toxic-appearing or diaphoretic.  HENT:     Head: Normocephalic and atraumatic.     Nose: Nose normal.     Mouth/Throat:     Mouth: Mucous membranes are moist.     Pharynx: Oropharynx is clear.  Eyes:     General: No scleral icterus.    Extraocular Movements: Extraocular movements intact.  Cardiovascular:     Rate  and Rhythm: Regular rhythm. Tachycardia present.     Heart sounds: Normal heart sounds. No murmur heard.    No friction rub. No gallop.  Pulmonary:     Effort: Pulmonary effort is normal. No respiratory distress.     Breath sounds: Normal breath sounds. No wheezing, rhonchi or rales.  Abdominal:     General: Bowel sounds are normal. There is no distension.     Palpations: Abdomen is soft.     Tenderness: There is no abdominal tenderness. There is no guarding or rebound.  Musculoskeletal:     Cervical back: Neck supple.     Right lower leg: No edema.     Left lower leg: No edema.  Skin:    General: Skin is warm and dry.     Coloration: Skin is not jaundiced or pale.  Neurological:     General: No focal deficit present.     Mental Status: She is alert and oriented to person, place, and time. Mental status is at  baseline.  Psychiatric:        Mood and Affect: Mood normal.        Behavior: Behavior normal.        Thought Content: Thought content normal.        Judgment: Judgment normal.      Assessment:  Ms. Elizabeth Mcmillan is a 82 y.o. female  who presents today for Colonoscopy for surveillance-personal history of colon polyps.  Plan:  Colonoscopy with possible intervention today  Colonoscopy with possible biopsy, control of bleeding, polypectomy, and interventions as necessary has been discussed with the patient/patient representative. Informed consent was obtained from the patient/patient representative after explaining the indication, nature, and risks of the procedure including but not limited to death, bleeding, perforation, missed neoplasm/lesions, cardiorespiratory compromise, and reaction to medications. Opportunity for questions was given and appropriate answers were provided. Patient/patient representative has verbalized understanding is amenable to undergoing the procedure.   Jaynie Collins, DO  [facility]  Portions of the record may have  been created with voice recognition software. Occasional wrong-word or 'sound-a-like' substitutions may have occurred due to the inherent limitations of voice recognition software.  Read the chart carefully and recognize, using context, where substitutions may have occurred.

## 2023-03-31 NOTE — Op Note (Signed)
[facility]enter[facility] Patient Name: Elizabeth Mcmillan Procedure Date: 03/31/2023 11:34 AM MRN: [id number] Account #: [id number] Date of Birth: August 12, 1940 Admit Type: Outpatient Age: 82 Room: [facility] ENDO ROOM 2 Gender: Female Note Status: Finalized Instrument Name: Peds Colonoscope [id number] Procedure:             Colonoscopy Indications:           High risk colon cancer surveillance: Personal history                         of colonic polyps Providers:             Jaynie Collins DO, DO Medicines:             Monitored Anesthesia Care Complications:         No immediate complications. Estimated blood loss:                         Minimal. Procedure:             Pre-Anesthesia Assessment:                        - Prior to the procedure, a History and Physical was                         performed, and patient medications and allergies were                         reviewed. The patient is competent. The risks and                         benefits of the procedure and the sedation options and                         risks were discussed with the patient. All questions                         were answered and informed consent was obtained.                         Patient identification and proposed procedure were                         verified by the physician, the nurse, the anesthetist                         and the technician in the endoscopy suite. Mental                         Status Examination: alert and oriented. Airway                         Examination: normal oropharyngeal airway and neck                         mobility. Respiratory Examination: clear to                         auscultation. CV Examination: RRR, no murmurs, no S3  or S4. Prophylactic Antibiotics: The patient does not                         require prophylactic antibiotics. Prior                         Anticoagulants: The patient has taken no anticoagulant                          or antiplatelet agents. ASA Grade Assessment: III - A                         patient with severe systemic disease. After reviewing                         the risks and benefits, the patient was deemed in                         satisfactory condition to undergo the procedure. The                         anesthesia plan was to use monitored anesthesia care                         (MAC). Immediately prior to administration of                         medications, the patient was re-assessed for adequacy                         to receive sedatives. The heart rate, respiratory                         rate, oxygen saturations, blood pressure, adequacy of                         pulmonary ventilation, and response to care were                         monitored throughout the procedure. The physical                         status of the patient was re-assessed after the                         procedure.                        After obtaining informed consent, the colonoscope was                         passed under direct vision. Throughout the procedure,                         the patient's blood pressure, pulse, and oxygen                         saturations were monitored continuously. The  Colonoscope was introduced through the anus and                         advanced to the the cecum, identified by appendiceal                         orifice and ileocecal valve. The colonoscopy was                         performed without difficulty. The patient tolerated                         the procedure well. The quality of the bowel                         preparation was evaluated using the BBPS [facility] Bowel                         Preparation Scale) with scores of: Right Colon = 2                         (minor amount of residual staining, small fragments of                         stool and/or opaque liquid, but mucosa seen well),                          Transverse Colon = 2 (minor amount of residual                         staining, small fragments of stool and/or opaque                         liquid, but mucosa seen well) and Left Colon = 2                         (minor amount of residual staining, small fragments of                         stool and/or opaque liquid, but mucosa seen well). The                         total BBPS score equals 6. The quality of the bowel                         preparation was good. The ileocecal valve, appendiceal                         orifice, and rectum were photographed. Findings:      The perianal and digital rectal examinations were normal. Pertinent       negatives include normal sphincter tone.      Retroflexion in the right colon was performed.      Three sessile polyps were found in the ascending colon. The polyps were       4 to 6 mm in size. These polyps were removed with a cold snare.       Resection  and retrieval were complete. Estimated blood loss was minimal.      Three sessile polyps were found in the rectum (1) and transverse colon       (2). The polyps were 1 to 2 mm in size. These polyps were removed with a       jumbo cold forceps. Resection and retrieval were complete.      There was evidence of a prior end-to-end colo-colonic anastomosis at 30       cm proximal to the anus. This was patent and was characterized by       healthy appearing mucosa. The anastomosis was traversed. Estimated blood       loss: none.      Non-bleeding internal hemorrhoids were found during retroflexion. The       hemorrhoids were Grade I (internal hemorrhoids that do not prolapse).       Estimated blood loss: none.      The exam was otherwise without abnormality on direct and retroflexion       views. Impression:            - Three 4 to 6 mm polyps in the ascending colon,                         removed with a cold snare. Resected and retrieved.                        - Three 1 to 2 mm polyps  in the rectum and in the                         transverse colon, removed with a jumbo cold forceps.                         Resected and retrieved.                        - Patent end-to-end colo-colonic anastomosis,                         characterized by healthy appearing mucosa.                        - Non-bleeding internal hemorrhoids.                        - The examination was otherwise normal on direct and                         retroflexion views. Recommendation:        - Patient has a contact number available for                         emergencies. The signs and symptoms of potential                         delayed complications were discussed with the patient.                         Return to normal activities tomorrow. Written  discharge instructions were provided to the patient.                        - Discharge patient to home.                        - Resume previous diet.                        - Continue present medications.                        - Await pathology results.                        - Repeat colonoscopy for surveillance based on                         pathology results.                        - Return to referring physician as previously                         scheduled.                        - The findings and recommendations were discussed with                         the patient. Procedure Code(s):     --- Professional ---                        [phone], Colonoscopy, flexible; with removal of                         tumor(s), polyp(s), or other lesion(s) by snare                         technique                        45380, 59, Colonoscopy, flexible; with biopsy, single                         or multiple Diagnosis Code(s):     --- Professional ---                        Z86.010, Personal history of colonic polyps                        D12.2, Benign neoplasm of ascending colon                        D12.8, Benign  neoplasm of rectum                        D12.3, Benign neoplasm of transverse colon (hepatic                         flexure or splenic flexure)  Z98.0, Intestinal bypass and anastomosis status                        K64.0, First degree hemorrhoids CPT copyright 2022 American Medical Association. All rights reserved. The codes documented in this report are preliminary and upon coder review may  be revised to meet current compliance requirements. Attending Participation:      I personally performed the entire procedure. Elfredia Nevins, DO Jaynie Collins DO, DO 03/31/2023 12:21:40 PM This report has been signed electronically. Number of Addenda: 0 Note Initiated On: 03/31/2023 11:34 AM Scope Withdrawal Time: 0 hours 17 minutes 47 seconds  Total Procedure Duration: 0 hours 22 minutes 55 seconds  Estimated Blood Loss:  Estimated blood loss was minimal.      [facility]

## 2023-03-31 NOTE — Transfer of Care (Signed)
Immediate Anesthesia Transfer of Care Note  Patient: Elizabeth Mcmillan  Procedure(s) Performed: COLONOSCOPY WITH PROPOFOL POLYPECTOMY  Patient Location: PACU  Anesthesia Type:General  Level of Consciousness: sedated  Airway & Oxygen Therapy: Patient Spontanous Breathing  Post-op Assessment: Report given to RN and Post -op Vital signs reviewed and stable  Post vital signs: Reviewed and stable  Last Vitals:  Vitals Value Taken Time  BP    Temp    Pulse 94 03/31/23 1216  Resp 14 03/31/23 1216  SpO2 99 % 03/31/23 1216  Vitals shown include unfiled device data.  Last Pain:  Vitals:   03/31/23 1051  TempSrc: Temporal  PainSc: 0-No pain         Complications: No notable events documented.

## 2023-03-31 NOTE — Anesthesia Postprocedure Evaluation (Signed)
Anesthesia Post Note  Patient: Elizabeth Mcmillan  Procedure(s) Performed: COLONOSCOPY WITH PROPOFOL POLYPECTOMY  Patient location during evaluation: PACU Anesthesia Type: General Level of consciousness: awake and alert Pain management: pain level controlled Vital Signs Assessment: post-procedure vital signs reviewed and stable Respiratory status: spontaneous breathing, nonlabored ventilation, respiratory function stable and patient connected to nasal cannula oxygen Cardiovascular status: blood pressure returned to baseline and stable Postop Assessment: no apparent nausea or vomiting Anesthetic complications: no   No notable events documented.   Last Vitals:  Vitals:   03/31/23 1051 03/31/23 1218  BP: (!) 157/86   Pulse: (!) 120   Resp: 20   Temp: (!) 36.1 C 36.8 C  SpO2: 98%     Last Pain:  Vitals:   03/31/23 1228  TempSrc:   PainSc: 0-No pain                 Corinda Gubler

## 2023-03-31 NOTE — Anesthesia Preprocedure Evaluation (Addendum)
Anesthesia Evaluation  Patient identified by MRN, date of birth, ID band Patient awake    Reviewed: Allergy & Precautions, H&P , NPO status , Patient's Chart, lab work & pertinent test results, reviewed documented beta blocker date and time   Airway Mallampati: II   Neck ROM: full    Dental  (+) Poor Dentition   Pulmonary neg pulmonary ROS   Pulmonary exam normal        Cardiovascular Exercise Tolerance: Poor hypertension, On Medications (-) angina + CAD, + Past MI and +CHF  (-) Orthopnea, (-) PND, (-) DOE and (-) DVT Normal cardiovascular exam Rhythm:regular Rate:Normal  Doing better now following recent hospitalization and diuresis. ja   Neuro/Psych  PSYCHIATRIC DISORDERS Anxiety Depression     Neuromuscular disease negative neurological ROS     GI/Hepatic negative GI ROS, Neg liver ROS,GERD  Medicated,,  Endo/Other  negative endocrine ROSdiabetes, Well Controlled    Renal/GU Renal diseasenegative Renal ROS  negative genitourinary   Musculoskeletal   Abdominal   Peds  Hematology negative hematology ROS (+)   Anesthesia Other Findings Past Medical History: No date: Anxiety 08/30/2021: CAD (coronary artery disease) No date: Diabetes mellitus without complication (HCC) No date: GERD (gastroesophageal reflux disease) No date: Hypercholesteremia No date: Hypertension 01/17/2021: NSTEMI (non-ST elevated myocardial infarction) [facility]) Past Surgical History: No date: ABDOMINAL HYSTERECTOMY years ago: BREAST EXCISIONAL BIOPSY; Left     Comment:  negative No date: COLON SURGERY 09/04/2016: COLONOSCOPY WITH PROPOFOL; N/A     Comment:  Procedure: COLONOSCOPY WITH PROPOFOL;  Surgeon: Scot Jun, MD;  Location: [facility] ENDOSCOPY;  Service:               Endoscopy;  Laterality: N/A; 01/18/2021: CORONARY STENT INTERVENTION; N/A     Comment:  Procedure: CORONARY STENT INTERVENTION;  Surgeon: Runell Gess, MD;  Location: [facility] INVASIVE CV LAB;  Service:               Cardiovascular;  Laterality: N/A; 01/18/2021: CORONARY ULTRASOUND/IVUS; N/A     Comment:  Procedure: Intravascular Ultrasound/IVUS;  Surgeon:               Runell Gess, MD;  Location: [facility] INVASIVE CV LAB;                Service: Cardiovascular;  Laterality: N/A; 01/18/2021: LEFT HEART CATH AND CORONARY ANGIOGRAPHY; N/A     Comment:  Procedure: LEFT HEART CATH AND CORONARY ANGIOGRAPHY;                Surgeon: Runell Gess, MD;  Location: [facility] INVASIVE CV              LAB;  Service: Cardiovascular;  Laterality: N/A; 05/09/2015: LUMBAR LAMINECTOMY/ DECOMPRESSION WITH MET-RX; Right     Comment:  Procedure: Far Lateral Lumbar Three-Four Microdiscectomy              WITH MET-RX;  Surgeon: Maeola Harman, MD;  Location: [facility]               NEURO ORS;  Service: Neurosurgery;  Laterality: Right; BMI    Body Mass Index: 22.85 kg/m     Reproductive/Obstetrics negative OB ROS  Anesthesia Physical Anesthesia Plan  ASA: 3  Anesthesia Plan: General   Post-op Pain Management:    Induction:   PONV Risk Score and Plan:   Airway Management Planned:   Additional Equipment:   Intra-op Plan:   Post-operative Plan:   Informed Consent: I have reviewed the patients History and Physical, chart, labs and discussed the procedure including the risks, benefits and alternatives for the proposed anesthesia with the patient or authorized representative who has indicated his/her understanding and acceptance.     Dental Advisory Given  Plan Discussed with: CRNA  Anesthesia Plan Comments:        Anesthesia Quick Evaluation

## 2023-03-31 NOTE — Interval H&P Note (Signed)
History and Physical Interval Note: Preprocedure H&P from 03/31/23  was reviewed and there was no interval change after seeing and examining the patient.  Written consent was obtained from the patient after discussion of risks, benefits, and alternatives. Patient has consented to proceed with Colonoscopy with possible intervention   03/31/2023 11:40 AM  [name]  has presented today for surgery, with the diagnosis of V12.72 (ICD-9-CM) - Z86.010 (ICD-10-CM) - Hx of adenomatous colonic polyps.  The various methods of treatment have been discussed with the patient and family. After consideration of risks, benefits and other options for treatment, the patient has consented to  Procedure(s): COLONOSCOPY WITH PROPOFOL (N/A) as a surgical intervention.  The patient's history has been reviewed, patient examined, no change in status, stable for surgery.  I have reviewed the patient's chart and labs.  Questions were answered to the patient's satisfaction.     Elizabeth Mcmillan

## 2023-04-01 ENCOUNTER — Encounter: Payer: Self-pay | Admitting: Gastroenterology

## 2023-04-29 ENCOUNTER — Ambulatory Visit (HOSPITAL_BASED_OUTPATIENT_CLINIC_OR_DEPARTMENT_OTHER): Payer: Medicare Other | Admitting: Family

## 2023-04-29 ENCOUNTER — Encounter (HOSPITAL_BASED_OUTPATIENT_CLINIC_OR_DEPARTMENT_OTHER): Payer: Self-pay | Admitting: Family

## 2023-04-29 VITALS — BP 106/62 | HR 83 | Ht 60.0 in | Wt 126.0 lb

## 2023-04-29 DIAGNOSIS — E785 Hyperlipidemia, unspecified: Secondary | ICD-10-CM | POA: Diagnosis not present

## 2023-04-29 DIAGNOSIS — I25118 Atherosclerotic heart disease of native coronary artery with other forms of angina pectoris: Secondary | ICD-10-CM

## 2023-04-29 DIAGNOSIS — I6521 Occlusion and stenosis of right carotid artery: Secondary | ICD-10-CM | POA: Diagnosis not present

## 2023-04-29 DIAGNOSIS — I10 Essential (primary) hypertension: Secondary | ICD-10-CM

## 2023-04-29 DIAGNOSIS — I77810 Thoracic aortic ectasia: Secondary | ICD-10-CM

## 2023-04-29 NOTE — Progress Notes (Signed)
Cardiology Office Note:  .   Date:  04/29/2023  ID:  Elizabeth Mcmillan, Elizabeth Mcmillan 06/09/1941, MRN [id number] PCP: Wilford Corner, PA-C  [facility] Providers Cardiologist:  Tonny Bollman, MD    History of Present Illness: .   Elizabeth Mcmillan is a 82 y.o. female  with a hx of CAD s/p NSTEMI with DES x2 to LAD 01/17/21, DM2, HTN, HLD, mitral regurgitation, aortic regurgitation, dilation of ascending aorta last seen 07/01/22.  Her family history is notable for coronary artery disease.   She presented to the ED 01/17/2021 with aching in both arms, pain in her left shoulder.  It was associated with diaphoresis and shortness of breath but no nausea or vomiting. HS troponin peaked at 1596.  She underwent cardiac catheterization 01/18/2021 with ostial LAD 50% and mid LAD 95% lesion treated with PCI/DES x2.  Recommended for aspirin and Brilinta for at least 1 year.  Echocardiogram 01/19/2021 showing LVEF 50 to 55%, grade 1 diastolic dysfunction, severe akinesis of the mid apical anteroseptal wall and apical segment, RV normal size and function, mild MR, moderate dilation of ascending aorta 40 mm, mild to moderate AI.   Seen in follow up 02/02/21 with her daughter. Given mild tachycardia with palpitations and to prevent hypotension, Amlodipine was stopped and Carvedilol increased. She was transitioned from Brilinta to Plavix due to cost. At follow up 04/02/21 doing well and recommended to follow up in 4-6 months.    Carotid duplex 08/31/21 with bilateral 1-39% stenosis. Renal duplex 05/20/22 with no renal artery stenosis.  At visit 05/24/2022 amlodipine increased to 10 mg and carvedilol increased to 25 mg twice daily. Updated echo ordered and performed  06/10/22 normal LVEF, mild LVH, mild AI, mild dilation ascending aorta  42mm. Metanephrines, cortisol, catecholamines ordered but not collected.   She was seen 07/01/2022 and started hydrochlorothiazide 25 mg daily. Last seen 08/12/22 started on Imdur 30mg  every  evening for BP.    Presents today for follow up independently. Tells me she fell 2 weeks ago and still had a knot on her leg. Tells me she tripped over a rug on the floor which she has moved. No lightheadedness, dizziness, near syncope, syncope. Tells me her blood pressure is routinely 150s-160s after medications at home. Her daughter assists her by filling pill box. Does feel it has somewhat improved since addition of HCTZ.  Admitted 8/13-8/15/24 with left flank pain and dysuria found to have mild AKI. CT with left sided pyelonephritis treated with IVF and abx. She declined HH PT. Hydrochlorothiazide and Olmesartan were held due to hypotension.   Presents today for follow up.  She has lost 5 pounds since her last office visit 9 months ago. Blood pressure has been "pretty good" at home. Some decreased stamina since hospitalization which is gradually  improving. Offered referral to outpatient PT which she politely declines. Reports no shortness of breath nor dyspnea on exertion. Reports no chest pain, pressure, or tightness. No edema, orthopnea, PND. Reports no palpitations.    ROS: Please see the history of present illness.    All other systems reviewed and are negative.   Studies Reviewed: Marland Kitchen   EKG Interpretation Date/Time:  Tuesday April 29 2023 14:18:24 EDT Ventricular Rate:  77 PR Interval:  152 QRS Duration:  78 QT Interval:  370 QTC Calculation: 418 R Axis:   7  Text Interpretation: Normal sinus rhythm  No acute ST/T wave changes. Confirmed by Gillian Shields ([id number]) on 04/29/2023 2:26:04 PM  Risk Assessment/Calculations:             Physical Exam:   VS:  BP 106/62   Pulse 83   Ht 5' (1.524 m)   Wt 126 lb (57.2 kg)   SpO2 94%   BMI 24.61 kg/m    Wt Readings from Last 3 Encounters:  04/29/23 126 lb (57.2 kg)  03/31/23 117 lb (53.1 kg)  03/04/23 128 lb (58.1 kg)    GEN: Well nourished, well developed in no acute distress NECK: No JVD; No carotid bruits CARDIAC: RRR,  no murmurs, rubs, gallops RESPIRATORY:  Clear to auscultation without rales, wheezing or rhonchi  ABDOMEN: Soft, non-tender, non-distended EXTREMITIES:  No edema; No deformity   ASSESSMENT AND PLAN: .    CAD s/p DESx2 to LAD 12/2020  Aortic atherosclerosis -  Stable with no anginal symptoms. No indication for ischemic evaluation.   GDMT Aspirin, Atorvastatin, Carvedilol. Heart healthy diet and regular cardiovascular exercise encouraged.    HLD, LDL goal less than 70 - 12/2022 LDL 67. Continue Atorvastatin.  Carotid artery stenosis - 08/2021 carotid duplex with bilateral 1-39% stenosis. 08/2022 mild right carotid stenosis 1-39%. Repeat duplex one year already ordered  HTN - BP at goal <130/80. Continue Amlodipine 10mg  QD, Coreg 25mg  BID, Hydralazine 100mg  BID, Olmesartan 40mg  QD, HCTZ 25g every day, Imdur 30mg  every evening.  Avoid TID dosing of Hydralazine as suspect compliance would be an issue.  Secondary workup:Prior rnal duplex no stenosis. 07/2020 normal TSH. Plan for cortisol, catecholamines, metanephrines to rule out secondary causes.  Potential future candidate for renal denervation when procedure resumes if BP above goal.  DM2 - Continue to follow with PCP.   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 Continue optimal BP control. Repeat echo due 05/2023, will schedule today.         Dispo: follow up in 6 months  Signed, Alver Sorrow, NP

## 2023-04-29 NOTE — Patient Instructions (Addendum)
Medication Instructions:  Continue your current medications  *If you need a refill on your cardiac medications before your next appointment, please call your pharmacy*   Testing/Procedures: Your physician has requested that you have an echocardiogram in November or December. Echocardiography is a painless test that uses sound waves to create images of your heart. It provides your doctor with information about the size and shape of your heart and how well your heart's chambers and valves are working. This procedure takes approximately one hour. There are no restrictions for this procedure. Please do NOT wear cologne, perfume, aftershave, or lotions (deodorant is allowed). Please arrive 15 minutes prior to your appointment time.   Follow-Up: At [facility], you and your health needs are our priority.  As part of our continuing mission to provide you with exceptional heart care, we have created designated Provider Care Teams.  These Care Teams include your primary Cardiologist (physician) and Advanced Practice Providers (APPs -  Physician Assistants and Nurse Practitioners) who all work together to provide you with the care you need, when you need it.  We recommend signing up for the patient portal called "MyChart".  Sign up information is provided on this After Visit Summary.  MyChart is used to connect with patients for Virtual Visits (Telemedicine).  Patients are able to view lab/test results, encounter notes, upcoming appointments, etc.  Non-urgent messages can be sent to your provider as well.   To learn more about what you can do with MyChart, go to [email or web address].    Your next appointment:   6 month(s)  Provider:   Tonny Bollman, MD  or Alver Sorrow, NP    Other Instructions  Exercises to do While Sitting Warm-up Before starting other exercises: Sit up as straight as you can. Have your knees bent at 90 degrees, which is the shape of the capital letter "L." Keep  your feet flat on the floor. Sit at the front edge of your chair, if you can. Pull in (tighten) the muscles in your abdomen and stretch your spine and neck as straight as you can. Hold this position for a few minutes. Breathe in and out evenly. Try to concentrate on your breathing, and relax your mind.  Stretching Exercise A: Arm stretch Hold your arms out straight in front of your body. Bend your hands at the wrist with your fingers pointing up, as if signaling someone to stop. Notice the slight tension in your forearms as you hold the position. Keeping your arms out and your hands bent, rotate your hands outward as far as you can and hold this stretch. Aim to have your thumbs pointing up and your pinkie fingers pointing down. Slowly repeat arm stretches for one minute as tolerated. Exercise B: Leg stretch If you can move your legs, try to "draw" letters on the floor with the toes of your foot. Write your name with one foot. Write your name with the toes of your other foot. Slowly repeat the movements for one minute as tolerated. Exercise C: Reach for the sky Reach your hands as far over your head as you can to stretch your spine. Move your hands and arms as if you are climbing a rope. Slowly repeat the movements for one minute as tolerated.  Range of motion exercises Exercise A: Shoulder roll Let your arms hang loosely at your sides. Lift just your shoulders up toward your ears, then let them relax back down. When your shoulders feel loose, rotate your shoulders in  backward and forward circles. Do shoulder rolls slowly for one minute as tolerated. Exercise B: March in place As if you are marching, pump your arms and lift your legs up and down. Lift your knees as high as you can. If you are unable to lift your knees, just pump your arms and move your ankles and feet up and down. March in place for one minute as tolerated. Exercise C: Seated jumping jacks Let your arms hang down  straight. Keeping your arms straight, lift them up over your head. Aim to point your fingers to the ceiling. While you lift your arms, straighten your legs and slide your heels along the floor to your sides, as wide as you can. As you bring your arms back down to your sides, slide your legs back together. If you are unable to use your legs, just move your arms. Slowly repeat seated jumping jacks for one minute as tolerated.  Strengthening exercises Exercise A: Shoulder squeeze Hold your arms straight out from your body to your sides, with your elbows bent and your fists pointed at the ceiling. Keeping your arms in the bent position, move them forward so your elbows and forearms meet in front of your face. Open your arms back out as wide as you can with your elbows still bent, until you feel your shoulder blades squeezing together. Hold for 5 seconds. Slowly repeat the movements forward and backward for one minute as tolerated.

## 2023-05-12 ENCOUNTER — Other Ambulatory Visit: Payer: Self-pay | Admitting: Cardiovascular Disease

## 2023-05-26 ENCOUNTER — Ambulatory Visit (HOSPITAL_BASED_OUTPATIENT_CLINIC_OR_DEPARTMENT_OTHER): Payer: Medicare Other

## 2023-05-26 ENCOUNTER — Telehealth (HOSPITAL_BASED_OUTPATIENT_CLINIC_OR_DEPARTMENT_OTHER): Payer: Self-pay

## 2023-05-26 DIAGNOSIS — I10 Essential (primary) hypertension: Secondary | ICD-10-CM

## 2023-05-26 DIAGNOSIS — R0609 Other forms of dyspnea: Secondary | ICD-10-CM | POA: Diagnosis not present

## 2023-05-26 LAB — ECHOCARDIOGRAM COMPLETE
Area-P 1/2: 3.31 cm2
MV M vel: 4.19 m/s
MV Peak grad: 70.2 mm[Hg]
P 1/2 time: 473 ms
Radius: 0.5 cm
S' Lateral: 2.61 cm

## 2023-05-26 NOTE — Telephone Encounter (Addendum)
Results called to patient who verbalizes understanding!    ----- Message from Alver Sorrow sent at 05/26/2023  4:16 PM EST ----- Echocardiogram with normal heart pumping function. Mild dilation ascending aorta 39 mm which is improved from previous. Good result!

## 2023-06-21 ENCOUNTER — Other Ambulatory Visit (HOSPITAL_BASED_OUTPATIENT_CLINIC_OR_DEPARTMENT_OTHER): Payer: Self-pay | Admitting: Family

## 2023-06-21 DIAGNOSIS — I10 Essential (primary) hypertension: Secondary | ICD-10-CM

## 2023-06-22 ENCOUNTER — Other Ambulatory Visit (HOSPITAL_BASED_OUTPATIENT_CLINIC_OR_DEPARTMENT_OTHER): Payer: Self-pay | Admitting: Family

## 2023-06-22 DIAGNOSIS — I10 Essential (primary) hypertension: Secondary | ICD-10-CM

## 2023-07-06 ENCOUNTER — Other Ambulatory Visit (HOSPITAL_BASED_OUTPATIENT_CLINIC_OR_DEPARTMENT_OTHER): Payer: Self-pay | Admitting: Family

## 2023-07-06 DIAGNOSIS — I10 Essential (primary) hypertension: Secondary | ICD-10-CM

## 2023-08-02 ENCOUNTER — Other Ambulatory Visit (HOSPITAL_BASED_OUTPATIENT_CLINIC_OR_DEPARTMENT_OTHER): Payer: Self-pay | Admitting: Family

## 2023-08-02 DIAGNOSIS — E785 Hyperlipidemia, unspecified: Secondary | ICD-10-CM

## 2023-08-02 DIAGNOSIS — I25118 Atherosclerotic heart disease of native coronary artery with other forms of angina pectoris: Secondary | ICD-10-CM

## 2023-08-25 ENCOUNTER — Encounter (HOSPITAL_BASED_OUTPATIENT_CLINIC_OR_DEPARTMENT_OTHER): Payer: No Typology Code available for payment source

## 2023-10-24 ENCOUNTER — Ambulatory Visit (HOSPITAL_BASED_OUTPATIENT_CLINIC_OR_DEPARTMENT_OTHER): Payer: No Typology Code available for payment source | Admitting: Family

## 2023-10-24 NOTE — Progress Notes (Deleted)
 Cardiology Office Note:  .   Date:  10/24/2023  ID:  Fay, Bagg 1940-08-06, MRN [id number] PCP: Wilford Corner, PA-C  [facility] Providers Cardiologist:  Tonny Bollman, MD    History of Present Illness: .   CAMELLA SEIM is a 83 y.o. female  with a hx of CAD s/p NSTEMI with DES x2 to LAD 01/17/21, DM2, HTN, HLD, mitral regurgitation, aortic regurgitation, dilation of ascending aorta last seen 07/01/22.  Her family history is notable for coronary artery disease.   She presented to the ED 01/17/2021 with aching in both arms, pain in her left shoulder.  It was associated with diaphoresis and shortness of breath but no nausea or vomiting. HS troponin peaked at 1596.  She underwent cardiac catheterization 01/18/2021 with ostial LAD 50% and mid LAD 95% lesion treated with PCI/DES x2.  Recommended for aspirin and Brilinta for at least 1 year.  Echocardiogram 01/19/2021 showing LVEF 50 to 55%, grade 1 diastolic dysfunction, severe akinesis of the mid apical anteroseptal wall and apical segment, RV normal size and function, mild MR, moderate dilation of ascending aorta 40 mm, mild to moderate AI.   Seen in follow up 02/02/21 with her daughter. Given mild tachycardia with palpitations and to prevent hypotension, Amlodipine was stopped and Carvedilol increased. She was transitioned from Brilinta to Plavix due to cost. At follow up 04/02/21 doing well and recommended to follow up in 4-6 months.    Carotid duplex 08/31/21 with bilateral 1-39% stenosis. Renal duplex 05/20/22 with no renal artery stenosis.  At visit 05/24/2022 amlodipine increased to 10 mg and carvedilol increased to 25 mg twice daily. Updated echo ordered and performed  06/10/22 normal LVEF, mild LVH, mild AI, mild dilation ascending aorta  42mm. Metanephrines, cortisol, catecholamines ordered but not collected.   She was seen 07/01/2022 and started hydrochlorothiazide 25 mg daily. Last seen 08/12/22 started on Imdur 30mg  every  evening for BP.    Presents today for follow up independently. Tells me she fell 2 weeks ago and still had a knot on her leg. Tells me she tripped over a rug on the floor which she has moved. No lightheadedness, dizziness, near syncope, syncope. Tells me her blood pressure is routinely 150s-160s after medications at home. Her daughter assists her by filling pill box. Does feel it has somewhat improved since addition of HCTZ.  Admitted 8/13-8/15/24 with left flank pain and dysuria found to have mild AKI. CT with left sided pyelonephritis treated with IVF and abx. She declined HH PT. Hydrochlorothiazide and Olmesartan were held due to hypotension.   Seen 04/29/23 scheduled repeat echo and offered referral to outpatient PT which she politely declines.Echo 05/26/23 left ventricular ejection fraction, by estimation, is 60 to 65% and mild dilation of ascending aorta measuring 39 mm.   Presents today for follow up.   ROS: Please see the history of present illness.    All other systems reviewed and are negative.   Studies Reviewed: .         Risk Assessment/Calculations:     No BP recorded.  {Refresh Note OR Click here to enter BP  :1}***       Physical Exam:   VS:  There were no vitals taken for this visit.   Wt Readings from Last 3 Encounters:  04/29/23 126 lb (57.2 kg)  03/31/23 117 lb (53.1 kg)  03/04/23 128 lb (58.1 kg)    GEN: Well nourished, well developed in no acute distress NECK:  No JVD; No carotid bruits CARDIAC: RRR, no murmurs, rubs, gallops RESPIRATORY:  Clear to auscultation without rales, wheezing or rhonchi  ABDOMEN: Soft, non-tender, non-distended EXTREMITIES:  No edema; No deformity   ASSESSMENT AND PLAN: .    CAD s/p DESx2 to LAD 12/2020  Aortic atherosclerosis -  ***  HLD, LDL goal less than 70 - ***  Carotid artery stenosis - 08/2021 carotid duplex with bilateral 1-39% stenosis. 08/2022 mild right carotid stenosis 1-39%. Repeat duplex one year already ordered.  ***  HTN -*** Secondary workup:Prior rnal duplex no stenosis. 07/2020 normal TSH. Plan for cortisol, catecholamines, metanephrines to rule out secondary causes.  Potential future candidate for renal denervation when procedure resumes if BP above goal.  DM2 - Continue to follow with PCP.   Valvular heart disease / Dilation ascending aorta -***         Dispo: follow up in 6 months  Signed, Alver Sorrow, NP

## 2023-10-24 NOTE — Progress Notes (Deleted)
 Cardiology Office Note:  .   Date:  10/24/2023  ID:  Elizabeth, Mcmillan [birth year], MRN [id number] PCP: Wilford Corner, PA-C   [facility] Prov[facility] Cardiologist:  Tonny Bollman, MD    History of Present Illness: .   Elizabeth Mcmillan is a 83 y.o. female  with a hx of CAD s/p NSTEMI with DES x2 to LAD 01/17/21, DM2, HTN, HLD, mitral regurgitation, aortic regurgitation, dilation of ascending aorta last seen 07/01/22.  Her family history is notable for coronary artery disease.   She presented to the ED 01/17/2021 with aching in both arms, pain in her left shoulder.  It was associated with diaphoresis and shortness of breath but no nausea or vomiting. HS troponin peaked at 1596.  She underwent cardiac catheterization 01/18/2021 with ostial LAD 50% and mid LAD 95% lesion treated with PCI/DES x2.  Recommended for aspirin and Brilinta for at least 1 year.  Echocardiogram 01/19/2021 showing LVEF 50 to 55%, grade 1 diastolic dysfunction, severe akinesis of the mid apical anteroseptal wall and apical segment, RV normal size and function, mild MR, moderate dilation of ascending aorta 40 mm, mild to moderate AI.   Seen in follow up 02/02/21 with her daughter. Given mild tachycardia with palpitations and to prevent hypotension, Amlodipine was stopped and Carvedilol increased. She was transitioned from Brilinta to Plavix due to cost. At follow up 04/02/21 doing well and recommended to follow up in 4-6 months.    Carotid duplex 08/31/21 with bilateral 1-39% stenosis. Renal duplex 05/20/22 with no renal artery stenosis.  At visit 05/24/2022 amlodipine increased to 10 mg and carvedilol increased to 25 mg twice daily. Updated echo ordered and performed  06/10/22 normal LVEF, mild LVH, mild AI, mild dilation ascending aorta  42mm. Metanephrines, cortisol, catecholamines ordered but not collected.   She was seen 07/01/2022 and started hydrochlorothiazide 25 mg daily. Last seen 08/12/22 started on Imdur 30mg  every  evening for BP.    Presents today for follow up independently. Tells me she fell 2 weeks ago and still had a knot on her leg. Tells me she tripped over a rug on the floor which she has moved. No lightheadedness, dizziness, near syncope, syncope. Tells me her blood pressure is routinely 150s-160s after medications at home. Her daughter assists her by filling pill box. Does feel it has somewhat improved since addition of HCTZ.  Admitted 8/13-8/15/24 with left flank pain and dysuria found to have mild AKI. CT with left sided pyelonephritis treated with IVF and abx. She declined HH PT. Hydrochlorothiazide and Olmesartan were held due to hypotension.   Presents today for follow up.  She has lost 5 pounds since her last office visit 9 months ago. Blood pressure has been "pretty good" at home. Some decreased stamina since hospitalization which is gradually  improving. Offered referral to outpatient PT which she politely declines. Reports no shortness of breath nor dyspnea on exertion. Reports no chest pain, pressure, or tightness. No edema, orthopnea, PND. Reports no palpitations.    ROS: Please see the history of present illness.    All other systems reviewed and are negative.   Studies Reviewed: .         Risk Assessment/Calculations:     No BP recorded.  {Refresh Note OR Click here to enter BP  :1}***       Physical Exam:   VS:  There were no vitals taken for this visit.   Wt Readings from Last 3 Encounters:  04/29/23 126  lb (57.2 kg)  03/31/23 117 lb (53.1 kg)  03/04/23 128 lb (58.1 kg)    GEN: Well nourished, well developed in no acute distress NECK: No JVD; No carotid bruits CARDIAC: RRR, no murmurs, rubs, gallops RESPIRATORY:  Clear to auscultation without rales, wheezing or rhonchi  ABDOMEN: Soft, non-tender, non-distended EXTREMITIES:  No edema; No deformity   ASSESSMENT AND PLAN: .    CAD s/p DESx2 to LAD 12/2020  Aortic atherosclerosis -  Stable with no anginal symptoms. No  indication for ischemic evaluation.   GDMT Aspirin, Atorvastatin, Carvedilol. Heart healthy diet and regular cardiovascular exercise encouraged.    HLD, LDL goal less than 70 - 12/2022 LDL 67. Continue Atorvastatin.  Carotid artery stenosis - 08/2021 carotid duplex with bilateral 1-39% stenosis. 08/2022 mild right carotid stenosis 1-39%. Repeat duplex one year already ordered  HTN - BP at goal <130/80. Continue Amlodipine 10mg  QD, Coreg 25mg  BID, Hydralazine 100mg  BID, Olmesartan 40mg  QD, HCTZ 25g every day, Imdur 30mg  every evening.  Avoid TID dosing of Hydralazine as suspect compliance would be an issue.  Secondary workup:Prior rnal duplex no stenosis. 07/2020 normal TSH. Plan for cortisol, catecholamines, metanephrines to rule out secondary causes.  Potential future candidate for renal denervation when procedure resumes if BP above goal.  DM2 - Continue to follow with PCP.   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 Continue optimal BP control. Repeat echo due 05/2023, will schedule today.         Dispo: follow up in 6 months  Signed, Alver Sorrow, NP

## 2023-11-16 IMAGING — CT CT ANGIO CHEST
2 of 7 series · 13 of 36 positions shown · IV contrast (agent unspecified)
Comparison: CT chest from same day.

CLINICAL DATA: Shortness of breath and elevated D-dimer. Fluid
retention.

EXAM:
CT ANGIOGRAPHY CHEST WITH CONTRAST
TECHNIQUE: Multidetector CT imaging of the chest was performed using the
standard protocol during bolus administration of intravenous
contrast. Multiplanar CT image reconstructions and MIPs were
obtained to evaluate the vascular anatomy.

[Series 5: pe axial thins · axial · 0.67mm/px · z∈[+362,+604]mm · 12 of 286 slices shown]
[im 22/286  lung]
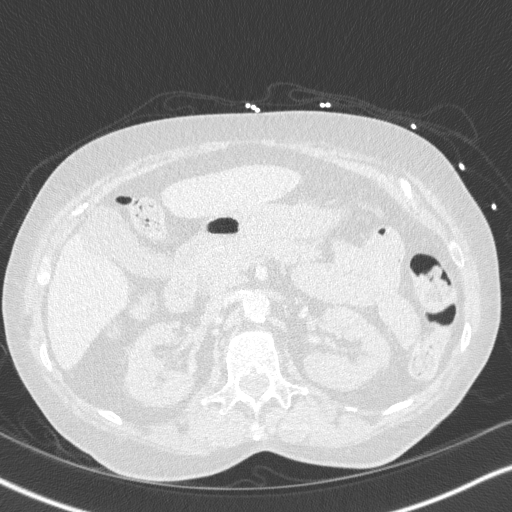
[im 44/286  mediastinal]
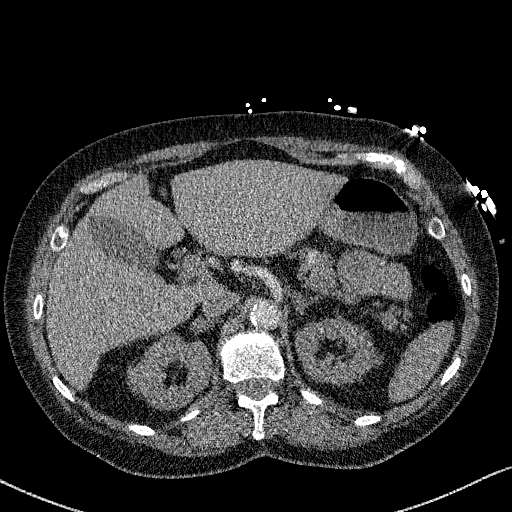
[im 66/286  lung]
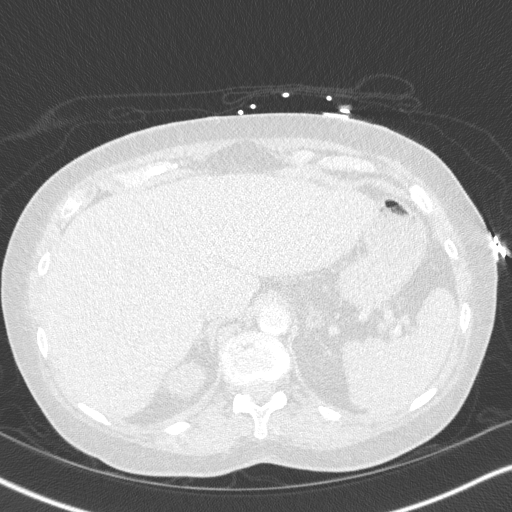
[im 88/286  mediastinal]
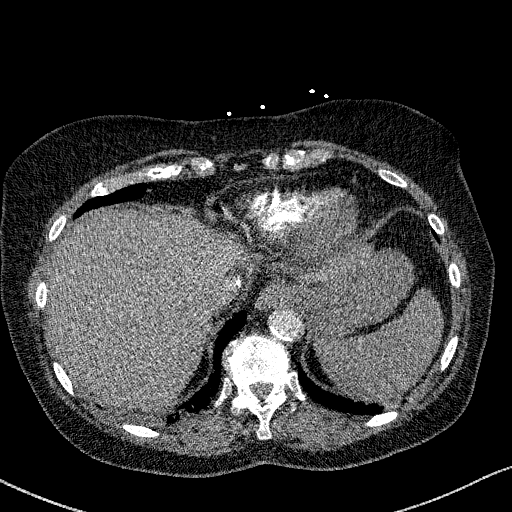
[im 110/286  lung]
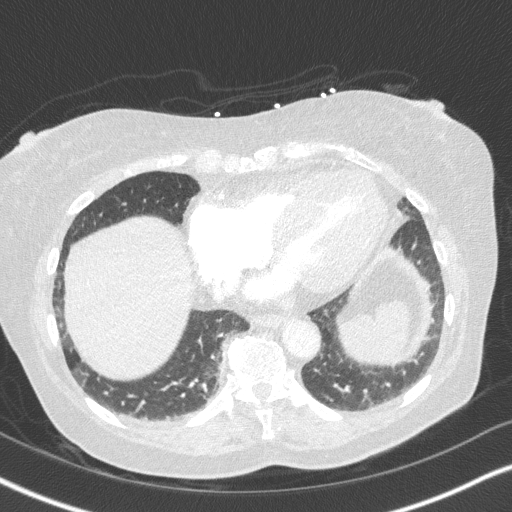
[im 132/286  mediastinal]
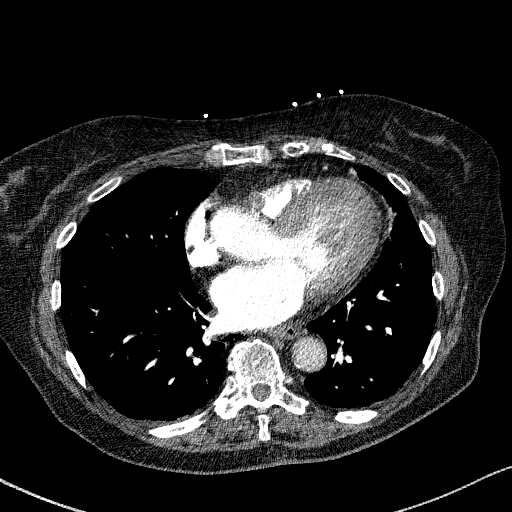
[im 154/286  lung]
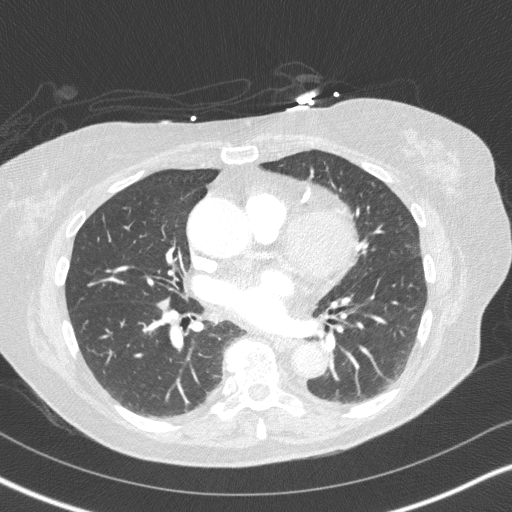
[im 176/286  mediastinal]
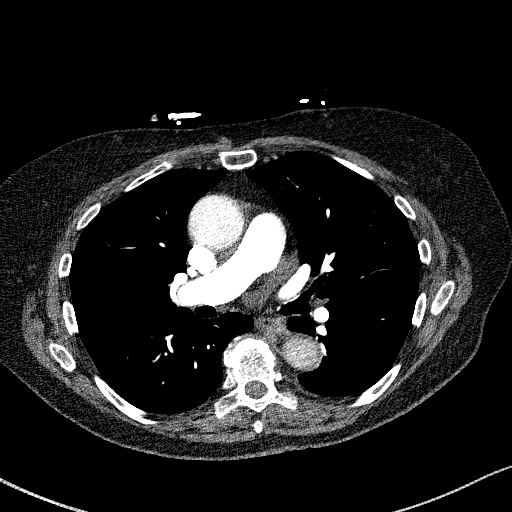
[im 198/286  lung]
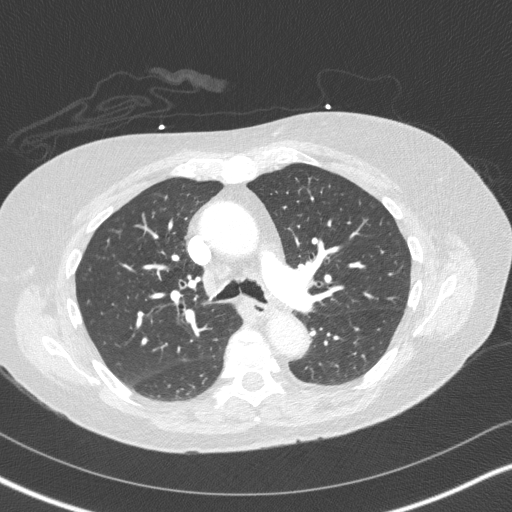
[im 220/286  mediastinal]
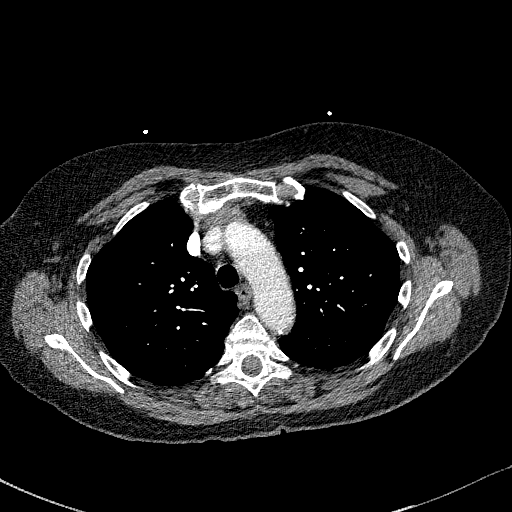
[im 242/286  lung]
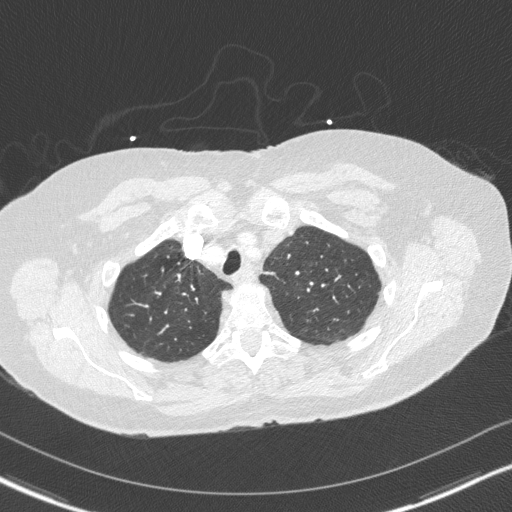
[im 264/286  mediastinal]
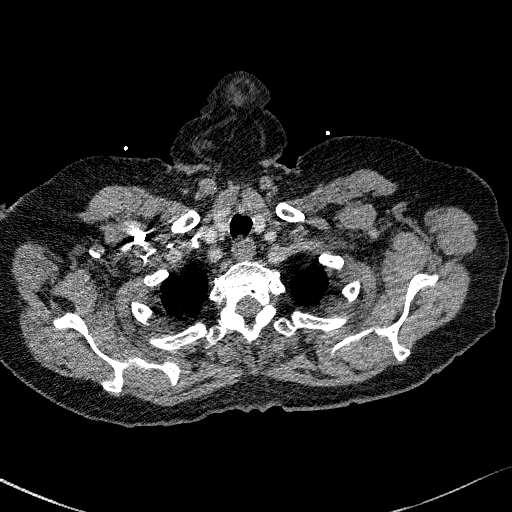

[Series 7: cor soft · coronal · 0.58mm/px · 1 of 122 slices shown]
[im 61/122  mediastinal]
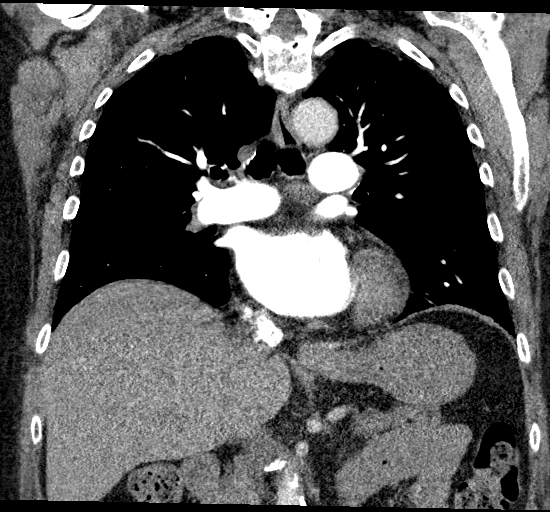

[13 of 36 positions shown; findings below may reference images not displayed]

RADIATION DOSE REDUCTION: This exam was performed according to the
departmental dose-optimization program which includes automated
exposure control, adjustment of the mA and/or kV according to
patient size and/or use of iterative reconstruction technique.

CONTRAST:  75mL OMNIPAQUE IOHEXOL 350 MG/ML SOLN
FINDINGS: Cardiovascular: Satisfactory opacification of the pulmonary arteries
to the segmental level. No evidence of pulmonary embolism. Unchanged
mild cardiomegaly. No pericardial effusion. No thoracic aortic
aneurysm or dissection. Coronary, aortic arch, and branch vessel
atherosclerotic vascular disease.

Mediastinum/Nodes: No enlarged mediastinal, hilar, or axillary lymph
nodes. Thyroid gland, trachea, and esophagus demonstrate no
significant findings.

Lungs/Pleura: No focal consolidation, pleural effusion, or
pneumothorax. Mild subsegmental atelectasis in the lingula and
posterior right lower lobe.

Upper Abdomen: No acute abnormality. Unchanged small right adrenal
adenoma.

Musculoskeletal: No chest wall abnormality. No acute or significant
osseous findings.

Review of the MIP images confirms the above findings.
IMPRESSION: 1. No evidence of pulmonary embolism. No acute intrathoracic
process.
2. Aortic Atherosclerosis ([id number]).

## 2023-11-16 IMAGING — CT CT CHEST W/O CM
2 of 4 series · 14 of 36 positions shown, 17 images · non-contrast
Comparison: Chest two views 08/29/2021 and 01/17/2021; CT abdomen
and pelvis 04/22/2017

CLINICAL DATA: Blunt chest trauma. Right breast hematoma and right
rib swelling and tenderness. No crepitus. Shortness of breath. Fluid
retention in legs and face. Abdominal distention.



[Series 2: routine chest without · axial · non-contrast · 0.68mm/px · z∈[+115,+343]mm · 11 of 136 slices shown, 14 images]
[im 11/136  mediastinal]
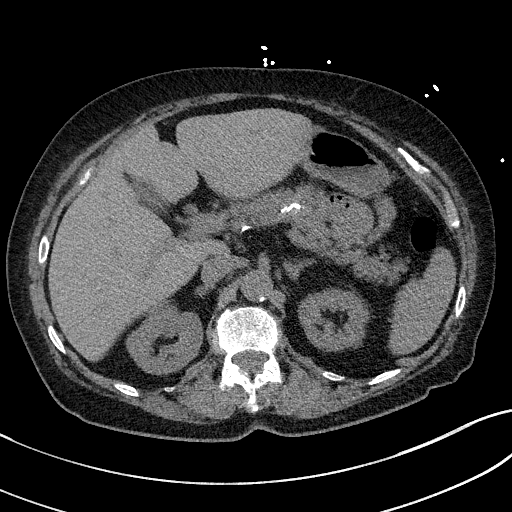
[im 11/136  lung]
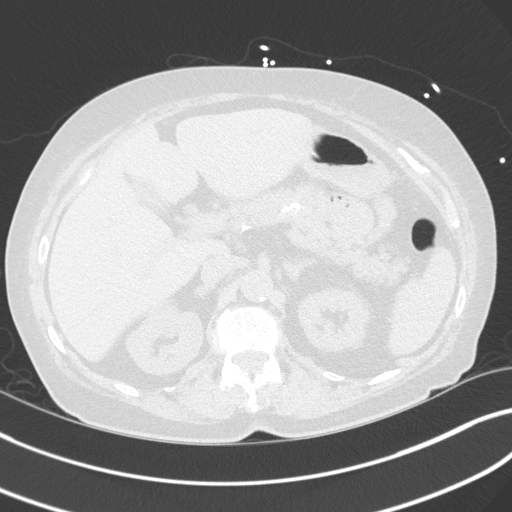
[im 21/136  lung]
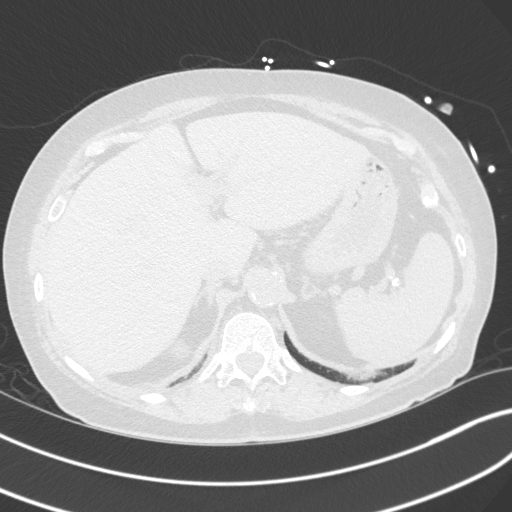
[im 32/136  lung]
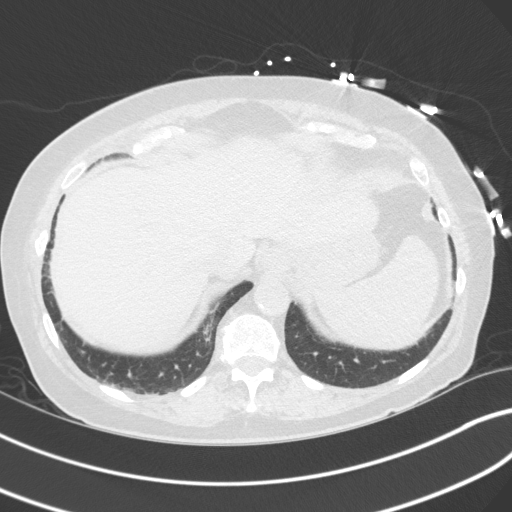
[im 42/136  lung]
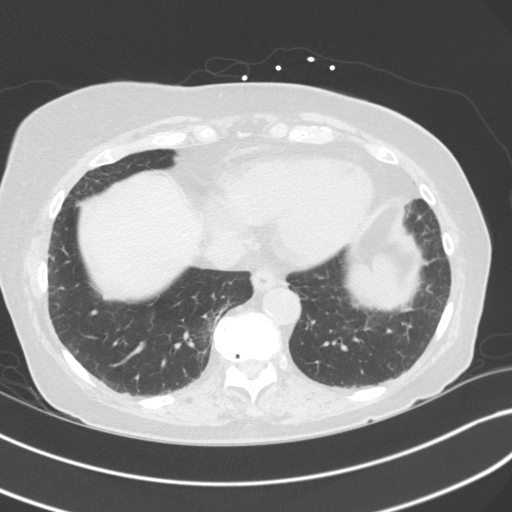
[im 52/136  mediastinal]
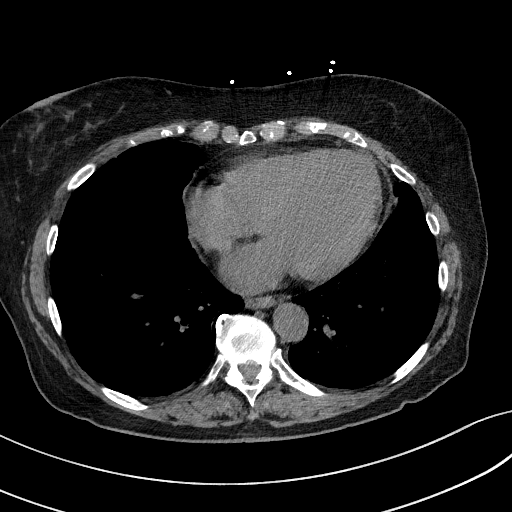
[im 52/136  lung]
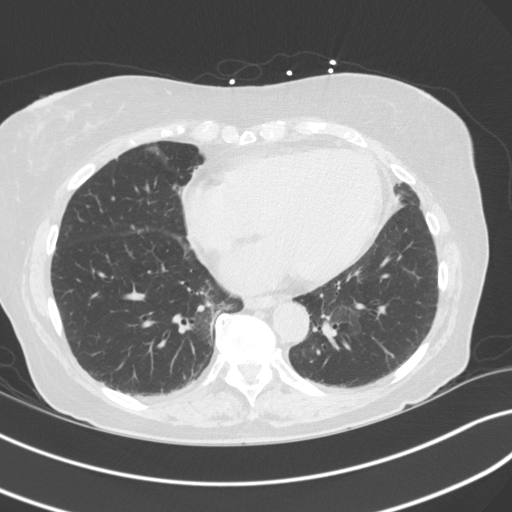
[im 73/136  lung]
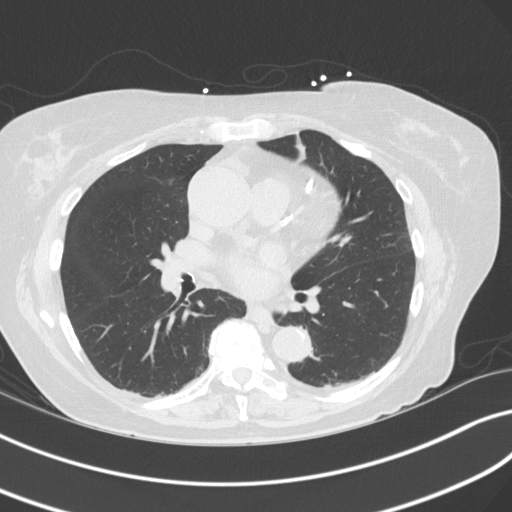
[im 84/136  lung]
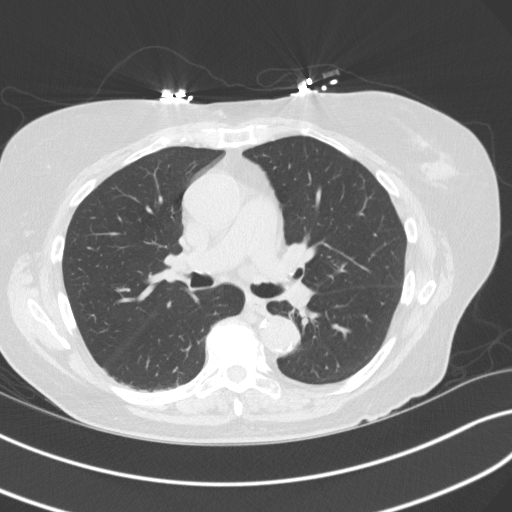
[im 94/136  lung]
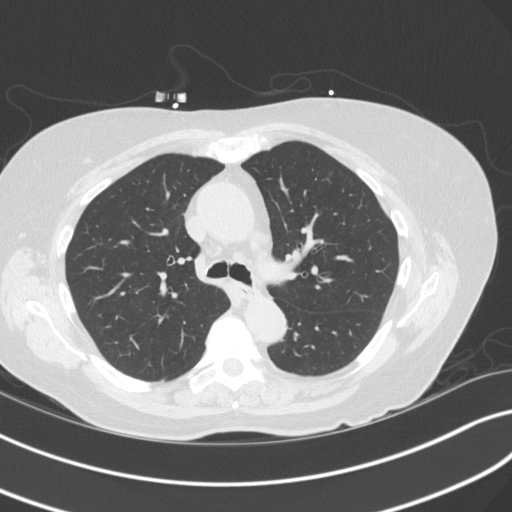
[im 104/136  mediastinal]
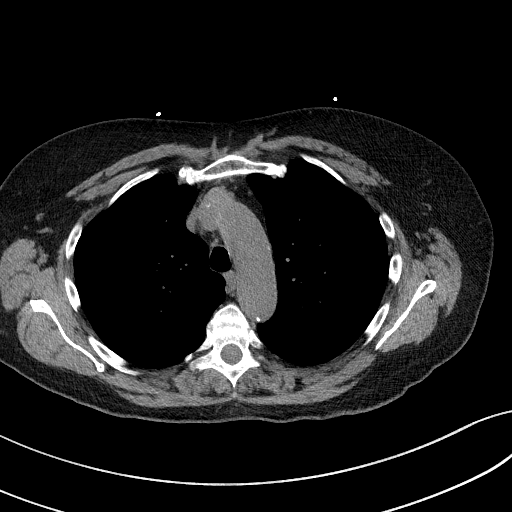
[im 104/136  lung]
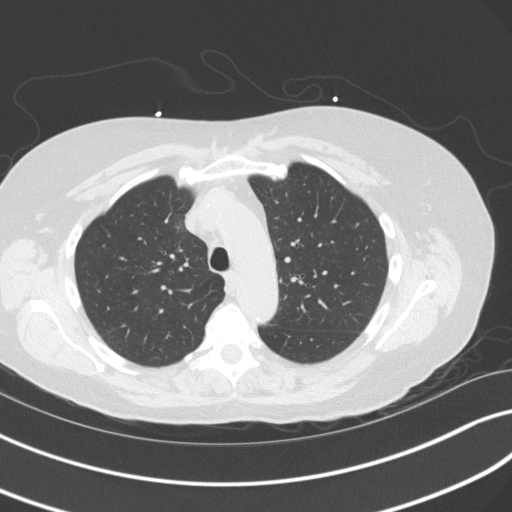
[im 115/136  lung]
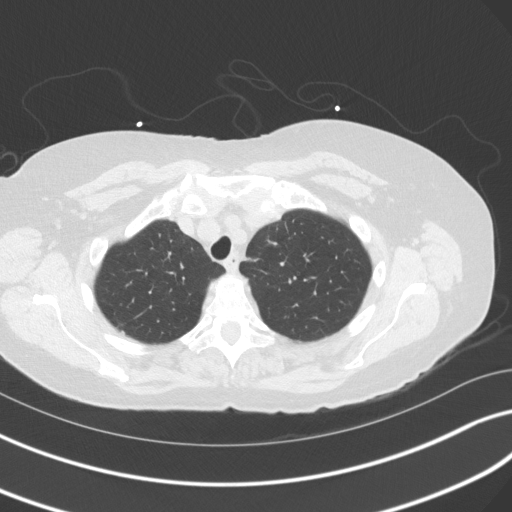
[im 125/136  lung]
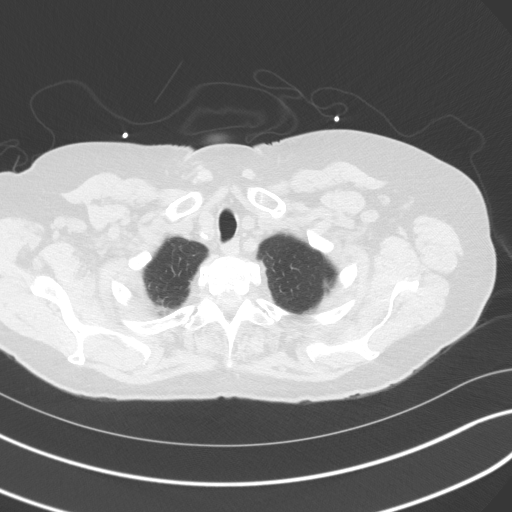

[Series 5: coronal · coronal · 0.56mm/px · 3 of 130 slices shown]
[im 26/130  lung]
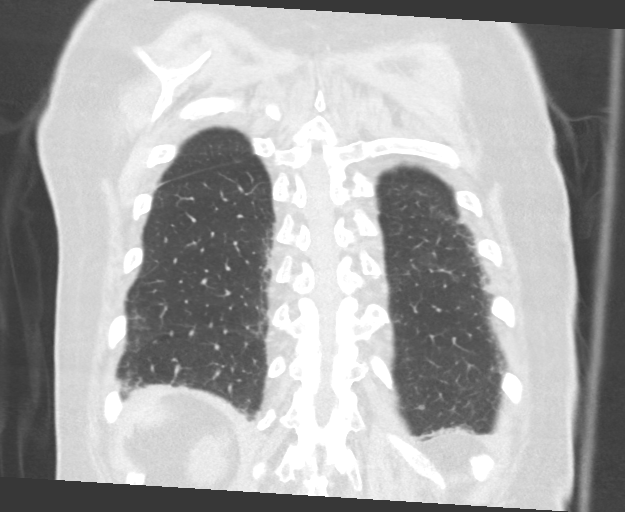
[im 52/130  lung]
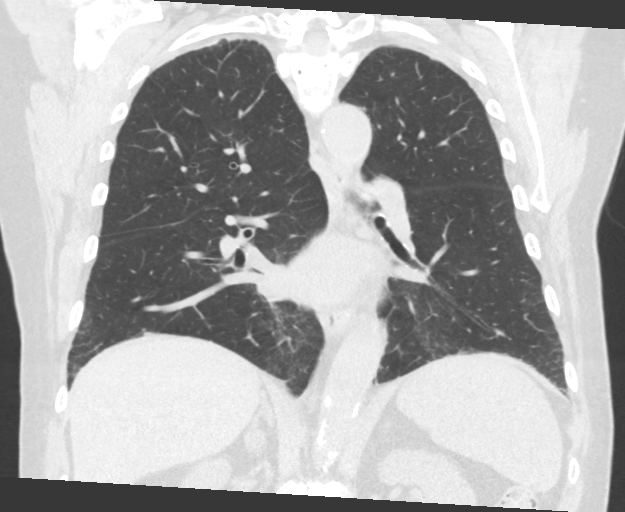
[im 78/130  lung]
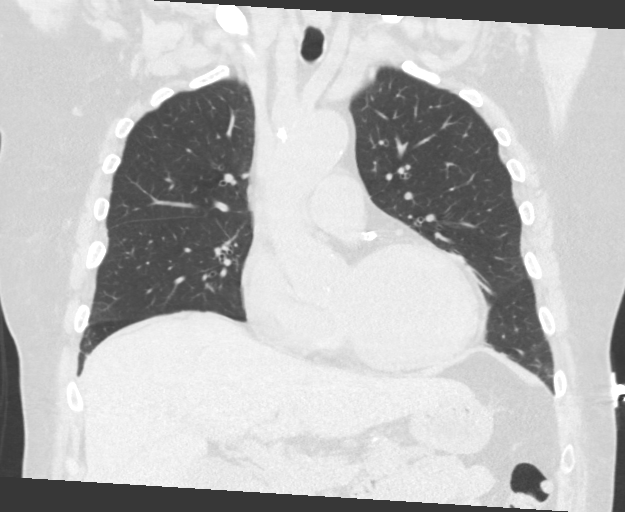

[14 of 36 positions shown; findings below may reference images not displayed]

FINDINGS: Cardiovascular: Heart size is mildly to moderately enlarged. Trace
pericardial fluid is seen. LAD likely two coronary artery stents.
The ascending aorta measures up to 3.9 cm in caliber, high-grade
ectatic but not aneurysmal. Mild-to-moderate calcifications within
the thoracic aorta. Normal caliber of the descending thoracic aorta.

Mediastinum/Nodes: No axillary, mediastinal, or hilar pathologically
enlarged lymph nodes by CT criteria. The visualized portion of the
thyroid gland is grossly unremarkable. Mild likely normal epicardial
fluid is seen. No higher density mediastinal hematoma. The esophagus
follows a normal course of normal caliber.

Lungs/Pleura: The central airways are patent. Mild bilateral lower
lung posterior subpleural cortical thickening and interlobular
septal thickening, likely a combination of subsegmental atelectasis
and/or scarring. No pleural effusion or pneumothorax. No evidence of
pneumonia.

Upper Abdomen: The left adrenal gland is normal. Within the inferior
aspect of the lateral limb of the right adrenal gland there is a
nodule with internal density of 17-29 Hounsfield units (axial image
122) measuring up to 1.2 cm. This is unchanged from 04/22/2017 CT
abdomen and pelvis and therefore benign.

Musculoskeletal: Mild levocurvature of the mid to lower thoracic
spine. Moderate multilevel degenerative disc changes. No significant
hematoma is visualized within the right breast noting a hematoma is
evidenced clinically. There may be mildly increased density of the
right breast tissue from soft tissue injury.
IMPRESSION: 1. No noncontrast CT evidence of acute traumatic injury within the
thorax.
2. Mild-to-moderate cardiomegaly.  Trace pericardial fluid.
3. Ectatic and borderline aneurysmal ascending aorta measuring up to
3.9 cm.
4. No acute lung process.
5. Mild levocurvature of the mid to lower thoracic spine with
moderate multilevel degenerative disc changes.

## 2024-01-18 ENCOUNTER — Other Ambulatory Visit (HOSPITAL_BASED_OUTPATIENT_CLINIC_OR_DEPARTMENT_OTHER): Payer: Self-pay | Admitting: Family

## 2024-01-18 DIAGNOSIS — I10 Essential (primary) hypertension: Secondary | ICD-10-CM

## 2024-02-09 ENCOUNTER — Encounter (HOSPITAL_BASED_OUTPATIENT_CLINIC_OR_DEPARTMENT_OTHER): Payer: Self-pay | Admitting: Family

## 2024-02-09 ENCOUNTER — Ambulatory Visit (HOSPITAL_BASED_OUTPATIENT_CLINIC_OR_DEPARTMENT_OTHER): Admitting: Family

## 2024-02-09 VITALS — BP 132/58 | HR 76 | Ht 60.0 in | Wt 128.0 lb

## 2024-02-09 DIAGNOSIS — I25118 Atherosclerotic heart disease of native coronary artery with other forms of angina pectoris: Secondary | ICD-10-CM | POA: Diagnosis not present

## 2024-02-09 DIAGNOSIS — I10 Essential (primary) hypertension: Secondary | ICD-10-CM | POA: Diagnosis not present

## 2024-02-09 DIAGNOSIS — E785 Hyperlipidemia, unspecified: Secondary | ICD-10-CM

## 2024-02-09 MED ORDER — HYDRALAZINE HCL 100 MG PO TABS: 100.0000 mg | ORAL_TABLET | Freq: Two times a day (BID) | ORAL | 3 refills | Status: AC

## 2024-02-09 MED ORDER — CARVEDILOL 25 MG PO TABS: 25.0000 mg | ORAL_TABLET | Freq: Two times a day (BID) | ORAL | 3 refills | Status: AC

## 2024-02-09 MED ORDER — HYDROCHLOROTHIAZIDE 25 MG PO TABS: ORAL_TABLET | ORAL | 3 refills | Status: AC

## 2024-02-09 MED ORDER — OLMESARTAN MEDOXOMIL 40 MG PO TABS: 40.0000 mg | ORAL_TABLET | Freq: Every day | ORAL | 3 refills | Status: AC

## 2024-02-09 MED ORDER — AMLODIPINE BESYLATE 10 MG PO TABS: 10.0000 mg | ORAL_TABLET | Freq: Every day | ORAL | 11 refills | Status: AC

## 2024-02-09 MED ORDER — ATORVASTATIN CALCIUM 80 MG PO TABS
80.0000 mg | ORAL_TABLET | Freq: Every day | ORAL | 3 refills | Status: AC
Start: 2024-02-09 — End: ?

## 2024-02-09 NOTE — Patient Instructions (Addendum)
 Medication Instructions:   DISCONTINUE IMDUR .  *If you need a refill on your cardiac medications before your next appointment, please call your pharmacy*  Lab Work  None ordered.  If you have labs (blood work) drawn today and your tests are completely normal, you will receive your results only by: MyChart Message (if you have MyChart) OR A paper copy in the mail If you have any lab test that is abnormal or we need to change your treatment, we will call you to review the results.  Testing/Procedures:  None ordered.  Follow-Up: At [facility], you and your health needs are our priority.  As part of our continuing mission to provide you with exceptional heart care, our providers are all part of one team.  This team includes your primary Cardiologist (physician) and Advanced Practice Providers or APPs (Physician Assistants and Nurse Practitioners) who all work together to provide you with the care you need, when you need it.  Your next appointment:   6 month(s)  Provider:   [name], NP    We recommend signing up for the patient portal called MyChart.  Sign up information is provided on this After Visit Summary.  MyChart is used to connect with patients for Virtual Visits (Telemedicine).  Patients are able to view lab/test results, encounter notes, upcoming appointments, etc.  Non-urgent messages can be sent to your provider as well.   To learn more about what you can do with MyChart, go to [email or web address].   Other Instructions     If you would like to switch to pill packs through [facility], please let us  know.   We will plan to recheck your cholesterol numbers in 3 months since you are back on Atorvastatin .   If your breathing does not improve with increasing physical activity, please let us  know and we can consider a stress test.   Heart Healthy Diet Recommendations: A low-salt diet is recommended. Meats should be grilled, baked, or boiled. Avoid fried  foods. Focus on lean protein sources like fish or chicken with vegetables and fruits. The American Heart Association is a Chief Technology Officer!  American Heart Association Diet and Lifeystyle Recommendations   Exercise recommendations: The American Heart Association recommends 150 minutes of moderate intensity exercise weekly. Try 30 minutes of moderate intensity exercise 4-5 times per week. This could include walking, jogging, or swimming.

## 2024-02-09 NOTE — Progress Notes (Signed)
 Cardiology Office Note   Date:  02/09/2024  ID:  Elizabeth Mcmillan, Elizabeth Mcmillan 1940-09-11, MRN [id number] PCP: Cyrus Selinda Moose, PA-C  [facility] Providers Cardiologist:  Ozell Fell, MD Cardiology APP:  Vannie Reche RAMAN, NP     History of Present Illness Elizabeth Mcmillan is a 83 y.o. female   with a hx of CAD s/p NSTEMI with DES x2 to LAD 01/17/21, DM2, HTN, HLD, mitral regurgitation, aortic regurgitation, dilation of ascending aorta last seen 07/01/22.  Her family history is notable for coronary artery disease.   She presented to the ED 01/17/2021 with aching in both arms, pain in her left shoulder.  It was associated with diaphoresis and shortness of breath but no nausea or vomiting. HS troponin peaked at 1596.  She underwent cardiac catheterization 01/18/2021 with ostial LAD 50% and mid LAD 95% lesion treated with PCI/DES x2.  Recommended for aspirin  and Brilinta  for at least 1 year.  Echocardiogram 01/19/2021 showing LVEF 50 to 55%, grade 1 diastolic dysfunction, severe akinesis of the mid apical anteroseptal wall and apical segment, RV normal size and function, mild MR, moderate dilation of ascending aorta 40 mm, mild to moderate AI.   At visit 01/2021 due to mild tachycardia with palpitations and to prevent hypotension, Amlodipine  was stopped and Carvedilol  increased. She was transitioned from Brilinta  to Plavix  due to cost.    Carotid duplex 08/31/21 with bilateral 1-39% stenosis. Renal duplex 05/20/22 with no renal artery stenosis.  At visit 05/24/2022 amlodipine  increased to 10 mg and carvedilol  increased to 25 mg twice daily. Updated echo ordered and performed  06/10/22 normal LVEF, mild LVH, mild AI, mild dilation ascending aorta  42mm. Metanephrines, cortisol, catecholamines ordered but not collected.   She was seen 07/01/2022 and started hydrochlorothiazide  25 mg daily. At visit1/22/24 started on Imdur  30mg  every evening for BP.    Admitted 8/13-8/15/24 with left flank pain and  dysuria found to have mild AKI. CT with left sided pyelonephritis treated with IVF and abx. She declined HH PT. Hydrochlorothiazide  and Olmesartan  were held due to hypotension.    At visit 04/29/23 she was doing well and recommended to remain off hydrochlorothiazide , olmesartan . Echocardiogram 05/26/2023 LVEF 60 to 65%, no RWMA, moderate LVH, indeterminate diastolic parameters, RV normal size and function, trivial MR, mild AI, mild dilation ascending aorta 39 mm.  At visit 01/05/24 visit with pcp noted to not be taking her amlodipine , she was recommended to resume as BP was markedly elevated 220/90.   Presents today for follow up with family member. Some exertional dyspnea that has been overall stable. No formal exercise routine. Notes not taking Imdur , unclear why or when stopped. Family member assists with medication trays. 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 Reports no chest pain, pressure, or tightness. No edema, orthopnea, PND. Reports no palpitations.    Dispense report: Amlodipine  10mg  01/05/24 30 day supply Carvedilol  25mg  01/28/24 30 day supply Hydralazine  100mg  12/08/23 90 day supply Olmesartan  40mg  01/19/24 90 day supply   ROS: Please see the history of present illness.    All other systems reviewed and are negative.   Studies Reviewed      Cardiac Studies & Procedures   ______________________________________________________________________________________________ CARDIAC CATHETERIZATION  CARDIAC CATHETERIZATION 01/18/2021  Conclusion Images from the original result were not included.   Ost LAD to Prox LAD lesion is 50% stenosed.  Mid LAD lesion is 95% stenosed.  A drug-eluting stent was  successfully placed using a STENT ONYX FRONTIER 2.5X22.  Post intervention, there is a 0% residual stenosis.  A drug-eluting stent was successfully placed using a STENT ONYX FRONTIER 3.5X18.  Post intervention, there is a 0%  residual stenosis.  Elizabeth Mcmillan is a 83 y.o. female   [id number] LOCATION:  FACILITY: [facility] PHYSICIAN: Dorn Lesches, M.D. 07-14-1941   DATE OF PROCEDURE:  01/18/2021  DATE OF DISCHARGE:     CARDIAC CATHETERIZATION / IVUS Guided PCI DES mid and Prox LAD    History obtained from chart review.83 y.o. female with type 2 diabetes, hypertension, and mixed hyperlipidemia who is being seen 01/17/2021 for the evaluation of non-STEMI.   PROCEDURE DESCRIPTION:  The patient was brought to the second floor [facility] cath lab in the postabsorptive state.  She was premedicated with IV Versed  and fentanyl .  Her right wristwas prepped and shaved in usual sterile fashion. Xylocaine  1% was used for local anesthesia. A 6 French sheath was inserted into the right radial artery using standard Seldinger technique. The patient received 4500 units  of heparin  intravenously.  A 5 [place] TIG catheter and pigtail catheters were used for selective coronary angiography and obtain left heart pressures.  Isovue  dye was used for the entirety of the case (95 cc total administered to patient).  Retrograde aortic, left ventricular and pullback pressures were recorded.  Radial cocktail was administered via the SideArm sheath.  The patient received an additional 5000's of heparin  (9500 units of heparin  total) with an ACT peak at 323.  Using a 6 [place] XB LAD 3.5 cm guide catheter along with a 0.14 [place] guidewire and a 2 mm x 12 mm balloon the mid LAD lesion was crossed with little difficulty and predilated.  The patient did receive 180 mg of Brilinta  prior to intervention.  I then chose a 2.5 x 22 mm long Medtronic frontier stent and deployed at 14 and 16 atm.  I postdilated with a 2.75 x 15 mm long noncompliant balloon at 16 atm (2.85 mm) resulting in reduction of a 95% mid LAD stenosis to 0% residual.  I did perform intravascular ultrasound imaging revealing excellent apposition of the stent struts to the  vessel wall.  I also performed IVUS imaging of the proximal LAD revealing an extensive amount of atherosclerotic plaque with a minimal lumen area of 3.8 mm.  The vessel diameter just proximal to the diagonal branch was 3.8 mm and in the proximal LAD 4.3 mm.  Elected to primarily stent this with a 3.5 mm x 18 mm long Medtronic frontier drug-eluting stent and postdilated with a 3.75 x 15 mm long noncompliant balloon up to 16 atm (3.85 mm) resulting reduction of a 50 to 60% hypodense proximal LAD stenosis to 0% residual.  The patient tolerated the procedure well.  The guidewire and catheter were removed.  The sheath was removed and a TR band was placed on the right wrist to achieve patent hemostasis.  The patient left the lab in stable condition.  Impression Ms. Rodriges had successful IVUS guided mid and proximal LAD PCI and drug-eluting stenting.  She will need to be on DAPT for 12 months uninterrupted (low-dose aspirin  and Brilinta ).  I used 95 cc of contrast with a max GFR of 99 cc.  She will need to be hydrated with close attention to her renal function.  A 2D echo has been ordered.  Dr. Wonda was notified of these results.   Dorn Lesches. MD, FACC 01/18/2021 3:07 PM  Findings Coronary Findings Diagnostic  Dominance: Right  Left Anterior Descending Ost LAD to Prox LAD lesion is 50% stenosed. Mid LAD lesion is 95% stenosed.  Intervention  Ost LAD to Prox LAD lesion Stent Lesion crossed with guidewire. A drug-eluting stent was successfully placed using a STENT ONYX FRONTIER 3.5X18. Stent strut is well apposed. Stent does not overlap previously placed stentPost-stent angioplasty was performed. Post-Intervention Lesion Assessment The intervention was successful. Pre-interventional TIMI flow is 3. Post-intervention TIMI flow is 3. No complications occurred at this lesion. Ultrasound (IVUS) was performed on the lesion post PCI. Stent well apposed. Severe plaque burden detected. There is a 0%  residual stenosis post intervention.  Mid LAD lesion Stent Pre-stent angioplasty was performed. A drug-eluting stent was successfully placed using a STENT ONYX FRONTIER 2.5X22. Stent strut is well apposed. Stent does not overlap previously placed stentPost-stent angioplasty was performed. Post-Intervention Lesion Assessment The intervention was successful. Pre-interventional TIMI flow is 3. Post-intervention TIMI flow is 3. No complications occurred at this lesion. Ultrasound (IVUS) was performed on the lesion post PCI. There is a 0% residual stenosis post intervention.     ECHOCARDIOGRAM  ECHOCARDIOGRAM COMPLETE 05/26/2023  Narrative ECHOCARDIOGRAM REPORT    Patient Name:   NASTASSIA BAZALDUA Date of Exam: 05/26/2023 Medical Rec #:  [id number]       Height:       60.0 in Accession #:    [id number]      Weight:       126.0 lb Date of Birth:  1940/09/22       BSA:          1.534 m Patient Age:    82 years        BP:           106/70 mmHg Patient Gender: F               HR:           68 bpm. Exam Location:  Outpatient  Procedure: 3D Echo, 2D Echo, Cardiac Doppler, Color Doppler and Strain Analysis  Indications:    Aortic Root dilation  History:        Patient has prior history of Echocardiogram examinations. CAD and Previous Myocardial Infarction; Risk Factors:Diabetes, Non-Smoker and Dyslipidemia.  Sonographer:    Orvil Holmes RDCS Referring Phys: [id number] Alyjah Lovingood S Ivyana Locey  IMPRESSIONS   1. Left ventricular ejection fraction, by estimation, is 60 to 65%. The left ventricle has normal function. The left ventricle has no regional wall motion abnormalities. There is moderate left ventricular hypertrophy. Left ventricular diastolic parameters are indeterminate. 2. Right ventricular systolic function is normal. The right ventricular size is normal. There is normal pulmonary artery systolic pressure. The estimated right ventricular systolic pressure is 24.7 mmHg. 3. The mitral valve  is normal in structure. Trivial mitral valve regurgitation. No evidence of mitral stenosis. 4. The aortic valve is tricuspid. Aortic valve regurgitation is mild. Aortic valve sclerosis/calcification is present, without any evidence of aortic stenosis. 5. Aortic dilatation noted. There is mild dilatation of the ascending aorta, measuring 39 mm. 6. The inferior vena cava is normal in size with greater than 50% respiratory variability, suggesting right atrial pressure of 3 mmHg.  FINDINGS Left Ventricle: Left ventricular ejection fraction, by estimation, is 60 to 65%. The left ventricle has normal function. The left ventricle has no regional wall motion abnormalities. The left ventricular internal cavity size was normal in size. There is moderate left ventricular hypertrophy. Left ventricular diastolic parameters  are indeterminate.  Right Ventricle: The right ventricular size is normal. No increase in right ventricular wall thickness. Right ventricular systolic function is normal. There is normal pulmonary artery systolic pressure. The tricuspid regurgitant velocity is 2.33 m/s, and with an assumed right atrial pressure of 3 mmHg, the estimated right ventricular systolic pressure is 24.7 mmHg.  Left Atrium: Left atrial size was normal in size.  Right Atrium: Right atrial size was normal in size.  Pericardium: There is no evidence of pericardial effusion.  Mitral Valve: The mitral valve is normal in structure. Trivial mitral valve regurgitation. No evidence of mitral valve stenosis.  Tricuspid Valve: The tricuspid valve is normal in structure. Tricuspid valve regurgitation is trivial.  Aortic Valve: The aortic valve is tricuspid. Aortic valve regurgitation is mild. Aortic regurgitation PHT measures 473 msec. Aortic valve sclerosis/calcification is present, without any evidence of aortic stenosis.  Pulmonic Valve: The pulmonic valve was not well visualized. Pulmonic valve regurgitation is  trivial.  Aorta: The aortic root is normal in size and structure and aortic dilatation noted. There is mild dilatation of the ascending aorta, measuring 39 mm.  Venous: The inferior vena cava is normal in size with greater than 50% respiratory variability, suggesting right atrial pressure of 3 mmHg.  IAS/Shunts: No atrial level shunt detected by color flow Doppler.   LEFT VENTRICLE PLAX 2D LVIDd:         4.05 cm   Diastology LVIDs:         2.61 cm   LV e' medial:    6.96 cm/s LV PW:         1.68 cm   LV E/e' medial:  10.8 LV IVS:        1.17 cm   LV e' lateral:   8.27 cm/s LVOT diam:     2.00 cm   LV E/e' lateral: 9.1 LV SV:         78 LV SV Index:   51 LVOT Area:     3.14 cm  3D Volume EF: 3D EF:        62 % LV EDV:       124 ml LV ESV:       47 ml LV SV:        77 ml  RIGHT VENTRICLE RV Basal diam:  4.07 cm RV Mid diam:    2.92 cm RV S prime:     15.20 cm/s TAPSE (M-mode): 2.4 cm  LEFT ATRIUM             Index        RIGHT ATRIUM           Index LA diam:        4.30 cm 2.80 cm/m   RA Area:     14.10 cm LA Vol (A2C):   48.9 ml 31.88 ml/m  RA Volume:   32.70 ml  21.32 ml/m LA Vol (A4C):   50.3 ml 32.79 ml/m LA Biplane Vol: 51.2 ml 33.38 ml/m AORTIC VALVE LVOT Vmax:   104.00 cm/s LVOT Vmean:  69.700 cm/s LVOT VTI:    0.247 m AI PHT:      473 msec  AORTA Ao Root diam: 3.60 cm Ao Asc diam:  3.90 cm  MITRAL VALVE                  TRICUSPID VALVE MV Area (PHT): 3.31 cm       TR Peak grad:   21.7 mmHg MV Decel  Time: 229 msec       TR Vmax:        233.00 cm/s MR Peak grad:    70.2 mmHg MR Mean grad:    50.0 mmHg    SHUNTS MR Vmax:         419.00 cm/s  Systemic VTI:  0.25 m MR Vmean:        338.0 cm/s   Systemic Diam: 2.00 cm MR PISA:         1.57 cm MR PISA Eff ROA: 12 mm MR PISA Radius:  0.50 cm MV E velocity: 75.30 cm/s MV A velocity: 58.60 cm/s MV E/A ratio:  1.28  Lonni Nanas MD Electronically signed by Lonni Nanas MD Signature  Date/Time: 05/26/2023/2:09:48 PM    Final          ______________________________________________________________________________________________      Risk Assessment/Calculations           Physical Exam VS:  BP (!) 132/58 (BP Location: Left Arm, Patient Position: Sitting, Cuff Size: Normal)   Pulse 76   Ht 5' (1.524 m)   Wt 128 lb (58.1 kg)   SpO2 97%   BMI 25.00 kg/m        Wt Readings from Last 3 Encounters:  02/09/24 128 lb (58.1 kg)  04/29/23 126 lb (57.2 kg)  03/31/23 117 lb (53.1 kg)    GEN: Well nourished, well developed in no acute distress NECK: No JVD; No carotid bruits CARDIAC: RRR, no murmurs, rubs, gallops RESPIRATORY:  Clear to auscultation without rales, wheezing or rhonchi  ABDOMEN: Soft, non-tender, non-distended EXTREMITIES:  No edema; No deformity   ASSESSMENT AND PLAN  CAD s/p DESx2 to LAD 12/2020  Aortic atherosclerosis -  Stable with no anginal symptoms. No indication for ischemic evaluation.     GDMT Aspirin , Atorvastatin , Carvedilol . Heart healthy diet and regular cardiovascular exercise encouraged.   Carotid artery stenosis - 08/2021 carotid duplex with bilateral 1-39% stenosis. 08/2022 mild right carotid stenosis 1-39%. Repeat duplex one year already ordered. Continue atorvastatin  40mg  daily.  HTN -BP reasonably controlled. Continue amlodipine  10 mg daily, carvedilol  25 mg twice daily, hydralazine  100 mg twice daily, HCTZ 25 mg daily, olmesartan  40 mg daily. Refills provided. As BP controlled not on Imdur , will not resume.  Secondary workup:Prior renal duplex no stenosis. 07/2020 normal TSH. Plan for cortisol, catecholamines, metanephrines to rule out secondary causes.  Potential future candidate for renal denervation when procedure resumes if BP above goal. DM2 - Continue to follow with PCP.  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 Echo 05/2023 normal LVEF,  ascending aorta 39mm. Continue optimal BP control. Consider repeat echo at follow up. Continue optimal BP control. Recommend avoidance of fluoroquinolones.         Dispo: follow up in 6 mos  Signed, Reche GORMAN Finder, NP
